# Patient Record
Sex: Male | Born: 1950
Health system: Southern US, Community
[De-identification: ages and names within clinical notes are randomized; demographics above are authoritative.]

## PROBLEM LIST (undated history)

## (undated) DIAGNOSIS — M1990 Unspecified osteoarthritis, unspecified site: Secondary | ICD-10-CM

## (undated) DIAGNOSIS — I639 Cerebral infarction, unspecified: Secondary | ICD-10-CM

## (undated) DIAGNOSIS — E7800 Pure hypercholesterolemia, unspecified: Secondary | ICD-10-CM

## (undated) DIAGNOSIS — G20A1 Parkinson's disease without dyskinesia, without mention of fluctuations: Secondary | ICD-10-CM

## (undated) DIAGNOSIS — F419 Anxiety disorder, unspecified: Secondary | ICD-10-CM

## (undated) DIAGNOSIS — F4000 Agoraphobia, unspecified: Secondary | ICD-10-CM

## (undated) DIAGNOSIS — I10 Essential (primary) hypertension: Secondary | ICD-10-CM

## (undated) DIAGNOSIS — K219 Gastro-esophageal reflux disease without esophagitis: Secondary | ICD-10-CM

## (undated) DIAGNOSIS — G20 Parkinson's disease: Secondary | ICD-10-CM

## (undated) DIAGNOSIS — I2510 Atherosclerotic heart disease of native coronary artery without angina pectoris: Secondary | ICD-10-CM

## (undated) DIAGNOSIS — F329 Major depressive disorder, single episode, unspecified: Secondary | ICD-10-CM

## (undated) DIAGNOSIS — F29 Unspecified psychosis not due to a substance or known physiological condition: Secondary | ICD-10-CM

## (undated) DIAGNOSIS — Z8719 Personal history of other diseases of the digestive system: Secondary | ICD-10-CM

## (undated) DIAGNOSIS — R519 Headache, unspecified: Secondary | ICD-10-CM

## (undated) DIAGNOSIS — E119 Type 2 diabetes mellitus without complications: Secondary | ICD-10-CM

## (undated) HISTORY — PX: CARDIAC CATHETERIZATION: SHX172

## (undated) HISTORY — PX: CHOLECYSTECTOMY: SHX55

---

## 2000-08-13 ENCOUNTER — Encounter: Payer: Self-pay | Admitting: Internal Medicine

## 2000-08-13 ENCOUNTER — Encounter: Payer: Self-pay | Admitting: Emergency Medicine

## 2000-08-13 ENCOUNTER — Inpatient Hospital Stay (HOSPITAL_COMMUNITY): Admission: EM | Admit: 2000-08-13 | Discharge: 2000-08-14 | Payer: Self-pay | Admitting: Emergency Medicine

## 2000-11-05 ENCOUNTER — Encounter: Payer: Self-pay | Admitting: Emergency Medicine

## 2000-11-05 ENCOUNTER — Inpatient Hospital Stay (HOSPITAL_COMMUNITY): Admission: EM | Admit: 2000-11-05 | Discharge: 2000-11-07 | Payer: Self-pay | Admitting: Emergency Medicine

## 2000-11-06 ENCOUNTER — Encounter: Payer: Self-pay | Admitting: Internal Medicine

## 2000-11-07 ENCOUNTER — Encounter: Payer: Self-pay | Admitting: *Deleted

## 2000-11-28 ENCOUNTER — Emergency Department (HOSPITAL_COMMUNITY): Admission: EM | Admit: 2000-11-28 | Discharge: 2000-11-28 | Payer: Self-pay | Admitting: Emergency Medicine

## 2001-01-14 ENCOUNTER — Emergency Department (HOSPITAL_COMMUNITY): Admission: EM | Admit: 2001-01-14 | Discharge: 2001-01-15 | Payer: Self-pay | Admitting: Emergency Medicine

## 2001-11-16 ENCOUNTER — Emergency Department (HOSPITAL_COMMUNITY): Admission: EM | Admit: 2001-11-16 | Discharge: 2001-11-16 | Payer: Self-pay | Admitting: *Deleted

## 2002-02-19 ENCOUNTER — Emergency Department (HOSPITAL_COMMUNITY): Admission: EM | Admit: 2002-02-19 | Discharge: 2002-02-20 | Payer: Self-pay | Admitting: *Deleted

## 2002-06-01 ENCOUNTER — Emergency Department (HOSPITAL_COMMUNITY): Admission: EM | Admit: 2002-06-01 | Discharge: 2002-06-02 | Payer: Self-pay | Admitting: Emergency Medicine

## 2002-06-02 ENCOUNTER — Encounter: Payer: Self-pay | Admitting: Emergency Medicine

## 2002-06-27 ENCOUNTER — Encounter: Admission: RE | Admit: 2002-06-27 | Discharge: 2002-06-27 | Payer: Self-pay | Admitting: Orthopedic Surgery

## 2002-07-10 ENCOUNTER — Encounter: Admission: RE | Admit: 2002-07-10 | Discharge: 2002-07-10 | Payer: Self-pay | Admitting: Orthopedic Surgery

## 2002-07-10 ENCOUNTER — Emergency Department (HOSPITAL_COMMUNITY): Admission: EM | Admit: 2002-07-10 | Discharge: 2002-07-10 | Payer: Self-pay | Admitting: Emergency Medicine

## 2002-07-10 ENCOUNTER — Encounter: Payer: Self-pay | Admitting: Orthopedic Surgery

## 2002-07-15 ENCOUNTER — Emergency Department (HOSPITAL_COMMUNITY): Admission: EM | Admit: 2002-07-15 | Discharge: 2002-07-15 | Payer: Self-pay | Admitting: Emergency Medicine

## 2002-07-27 ENCOUNTER — Encounter: Admission: RE | Admit: 2002-07-27 | Discharge: 2002-07-27 | Payer: Self-pay | Admitting: Orthopedic Surgery

## 2002-08-27 ENCOUNTER — Emergency Department (HOSPITAL_COMMUNITY): Admission: EM | Admit: 2002-08-27 | Discharge: 2002-08-27 | Payer: Self-pay | Admitting: Emergency Medicine

## 2002-10-16 ENCOUNTER — Encounter: Payer: Self-pay | Admitting: Emergency Medicine

## 2002-10-16 ENCOUNTER — Emergency Department (HOSPITAL_COMMUNITY): Admission: EM | Admit: 2002-10-16 | Discharge: 2002-10-16 | Payer: Self-pay | Admitting: Emergency Medicine

## 2002-10-23 ENCOUNTER — Emergency Department (HOSPITAL_COMMUNITY): Admission: EM | Admit: 2002-10-23 | Discharge: 2002-10-23 | Payer: Self-pay | Admitting: Emergency Medicine

## 2002-10-28 ENCOUNTER — Emergency Department (HOSPITAL_COMMUNITY): Admission: EM | Admit: 2002-10-28 | Discharge: 2002-10-28 | Payer: Self-pay | Admitting: Emergency Medicine

## 2002-12-13 ENCOUNTER — Emergency Department (HOSPITAL_COMMUNITY): Admission: EM | Admit: 2002-12-13 | Discharge: 2002-12-13 | Payer: Self-pay | Admitting: Emergency Medicine

## 2002-12-19 ENCOUNTER — Emergency Department (HOSPITAL_COMMUNITY): Admission: EM | Admit: 2002-12-19 | Discharge: 2002-12-19 | Payer: Self-pay | Admitting: Emergency Medicine

## 2002-12-19 ENCOUNTER — Encounter: Payer: Self-pay | Admitting: Emergency Medicine

## 2003-05-03 ENCOUNTER — Emergency Department (HOSPITAL_COMMUNITY): Admission: EM | Admit: 2003-05-03 | Discharge: 2003-05-03 | Payer: Self-pay | Admitting: Emergency Medicine

## 2003-05-03 ENCOUNTER — Encounter: Payer: Self-pay | Admitting: Emergency Medicine

## 2003-06-02 ENCOUNTER — Emergency Department (HOSPITAL_COMMUNITY): Admission: EM | Admit: 2003-06-02 | Discharge: 2003-06-02 | Payer: Self-pay | Admitting: Emergency Medicine

## 2003-07-05 ENCOUNTER — Emergency Department (HOSPITAL_COMMUNITY): Admission: EM | Admit: 2003-07-05 | Discharge: 2003-07-05 | Payer: Self-pay | Admitting: Emergency Medicine

## 2003-07-05 ENCOUNTER — Encounter: Payer: Self-pay | Admitting: Emergency Medicine

## 2003-07-14 ENCOUNTER — Encounter: Payer: Self-pay | Admitting: Emergency Medicine

## 2003-07-14 ENCOUNTER — Emergency Department (HOSPITAL_COMMUNITY): Admission: EM | Admit: 2003-07-14 | Discharge: 2003-07-14 | Payer: Self-pay | Admitting: Emergency Medicine

## 2003-10-01 ENCOUNTER — Emergency Department (HOSPITAL_COMMUNITY): Admission: EM | Admit: 2003-10-01 | Discharge: 2003-10-01 | Payer: Self-pay | Admitting: Emergency Medicine

## 2004-07-21 ENCOUNTER — Emergency Department (HOSPITAL_COMMUNITY): Admission: EM | Admit: 2004-07-21 | Discharge: 2004-07-22 | Payer: Self-pay | Admitting: Emergency Medicine

## 2004-09-15 ENCOUNTER — Emergency Department (HOSPITAL_COMMUNITY): Admission: EM | Admit: 2004-09-15 | Discharge: 2004-09-15 | Payer: Self-pay | Admitting: Emergency Medicine

## 2004-09-20 ENCOUNTER — Emergency Department (HOSPITAL_COMMUNITY): Admission: EM | Admit: 2004-09-20 | Discharge: 2004-09-20 | Payer: Self-pay | Admitting: Emergency Medicine

## 2004-11-18 ENCOUNTER — Emergency Department (HOSPITAL_COMMUNITY): Admission: EM | Admit: 2004-11-18 | Discharge: 2004-11-18 | Payer: Self-pay | Admitting: Emergency Medicine

## 2004-12-21 ENCOUNTER — Emergency Department (HOSPITAL_COMMUNITY): Admission: EM | Admit: 2004-12-21 | Discharge: 2004-12-21 | Payer: Self-pay | Admitting: Emergency Medicine

## 2005-02-22 ENCOUNTER — Inpatient Hospital Stay (HOSPITAL_COMMUNITY): Admission: EM | Admit: 2005-02-22 | Discharge: 2005-02-25 | Payer: Self-pay | Admitting: Family Medicine

## 2005-02-22 ENCOUNTER — Ambulatory Visit: Payer: Self-pay | Admitting: Internal Medicine

## 2005-02-25 ENCOUNTER — Ambulatory Visit: Payer: Self-pay | Admitting: Psychiatry

## 2005-02-25 ENCOUNTER — Inpatient Hospital Stay (HOSPITAL_COMMUNITY): Admission: RE | Admit: 2005-02-25 | Discharge: 2005-03-04 | Payer: Self-pay | Admitting: Psychiatry

## 2005-03-23 ENCOUNTER — Emergency Department (HOSPITAL_COMMUNITY): Admission: EM | Admit: 2005-03-23 | Discharge: 2005-03-23 | Payer: Self-pay | Admitting: Emergency Medicine

## 2005-04-05 ENCOUNTER — Emergency Department (HOSPITAL_COMMUNITY): Admission: EM | Admit: 2005-04-05 | Discharge: 2005-04-05 | Payer: Self-pay | Admitting: Emergency Medicine

## 2005-04-19 ENCOUNTER — Emergency Department (HOSPITAL_COMMUNITY): Admission: EM | Admit: 2005-04-19 | Discharge: 2005-04-19 | Payer: Self-pay | Admitting: Emergency Medicine

## 2005-10-21 ENCOUNTER — Emergency Department (HOSPITAL_COMMUNITY): Admission: EM | Admit: 2005-10-21 | Discharge: 2005-10-21 | Payer: Self-pay | Admitting: Emergency Medicine

## 2006-05-20 ENCOUNTER — Emergency Department (HOSPITAL_COMMUNITY): Admission: EM | Admit: 2006-05-20 | Discharge: 2006-05-20 | Payer: Self-pay | Admitting: *Deleted

## 2008-02-13 ENCOUNTER — Emergency Department (HOSPITAL_COMMUNITY): Admission: EM | Admit: 2008-02-13 | Discharge: 2008-02-13 | Payer: Self-pay | Admitting: Emergency Medicine

## 2008-03-29 ENCOUNTER — Emergency Department (HOSPITAL_COMMUNITY): Admission: EM | Admit: 2008-03-29 | Discharge: 2008-03-29 | Payer: Self-pay | Admitting: Emergency Medicine

## 2008-06-04 ENCOUNTER — Emergency Department (HOSPITAL_COMMUNITY): Admission: EM | Admit: 2008-06-04 | Discharge: 2008-06-04 | Payer: Self-pay | Admitting: Emergency Medicine

## 2008-06-27 ENCOUNTER — Emergency Department (HOSPITAL_COMMUNITY): Admission: EM | Admit: 2008-06-27 | Discharge: 2008-06-27 | Payer: Self-pay | Admitting: Emergency Medicine

## 2008-12-06 ENCOUNTER — Emergency Department (HOSPITAL_COMMUNITY): Admission: EM | Admit: 2008-12-06 | Discharge: 2008-12-06 | Payer: Self-pay | Admitting: Emergency Medicine

## 2009-02-13 ENCOUNTER — Emergency Department (HOSPITAL_COMMUNITY): Admission: EM | Admit: 2009-02-13 | Discharge: 2009-02-13 | Payer: Self-pay | Admitting: Emergency Medicine

## 2009-04-18 ENCOUNTER — Emergency Department (HOSPITAL_COMMUNITY): Admission: EM | Admit: 2009-04-18 | Discharge: 2009-04-18 | Payer: Self-pay | Admitting: Emergency Medicine

## 2010-02-11 ENCOUNTER — Emergency Department (HOSPITAL_COMMUNITY): Admission: EM | Admit: 2010-02-11 | Discharge: 2010-02-11 | Payer: Self-pay | Admitting: Gastroenterology

## 2010-02-11 ENCOUNTER — Emergency Department (HOSPITAL_COMMUNITY): Admission: EM | Admit: 2010-02-11 | Discharge: 2010-02-11 | Payer: Self-pay | Admitting: Emergency Medicine

## 2010-02-16 ENCOUNTER — Emergency Department (HOSPITAL_COMMUNITY): Admission: EM | Admit: 2010-02-16 | Discharge: 2010-02-17 | Payer: Self-pay | Admitting: Emergency Medicine

## 2010-09-20 DIAGNOSIS — K759 Inflammatory liver disease, unspecified: Secondary | ICD-10-CM

## 2010-09-20 HISTORY — DX: Inflammatory liver disease, unspecified: K75.9

## 2010-12-07 LAB — COMPREHENSIVE METABOLIC PANEL
ALT: 38 U/L (ref 0–53)
AST: 32 U/L (ref 0–37)
Albumin: 4.6 g/dL (ref 3.5–5.2)
Alkaline Phosphatase: 64 U/L (ref 39–117)
BUN: 10 mg/dL (ref 6–23)
CO2: 22 mEq/L (ref 19–32)
Calcium: 9.2 mg/dL (ref 8.4–10.5)
Chloride: 103 mEq/L (ref 96–112)
Creatinine, Ser: 1.03 mg/dL (ref 0.4–1.5)
GFR calc Af Amer: >60 mL/min (ref >60)
GFR calc non Af Amer: >60 mL/min (ref >60)
Glucose, Bld: 162 mg/dL — ABNORMAL HIGH (ref 70–99)
Potassium: 3.3 mEq/L — ABNORMAL LOW (ref 3.5–5.1)
Sodium: 134 mEq/L — ABNORMAL LOW (ref 135–145)
Total Bilirubin: 0.7 mg/dL (ref 0.3–1.2)
Total Protein: 7.8 g/dL (ref 6.0–8.3)

## 2010-12-07 LAB — CBC
HCT: 47.9 % (ref 39.0–52.0)
Hemoglobin: 16.2 g/dL (ref 13.0–17.0)
MCHC: 33.9 g/dL (ref 30.0–36.0)
MCV: 78.3 fL (ref 78.0–100.0)
Platelets: 200 10*3/uL (ref 150–400)
RBC: 6.11 MIL/uL — ABNORMAL HIGH (ref 4.22–5.81)
RDW: 13.8 % (ref 11.5–15.5)
WBC: 5.5 10*3/uL (ref 4.0–10.5)

## 2010-12-07 LAB — DIFFERENTIAL
Basophils Absolute: 0 10*3/uL (ref 0.0–0.1)
Basophils Relative: 0 % (ref 0–1)
Eosinophils Absolute: 0 10*3/uL (ref 0.0–0.7)
Eosinophils Relative: 1 % (ref 0–5)
Lymphocytes Relative: 31 % (ref 12–46)
Lymphs Abs: 1.7 10*3/uL (ref 0.7–4.0)
Monocytes Absolute: 0.6 10*3/uL (ref 0.1–1.0)
Monocytes Relative: 11 % (ref 3–12)
Neutro Abs: 3.1 10*3/uL (ref 1.7–7.7)
Neutrophils Relative %: 57 % (ref 43–77)

## 2010-12-07 LAB — POCT URINALYSIS DIP (DEVICE)
Glucose, UA: NEGATIVE mg/dL
Protein, ur: >=300 mg/dL — AB

## 2010-12-07 LAB — RAPID URINE DRUG SCREEN, HOSP PERFORMED
Amphetamines: NOT DETECTED
Barbiturates: NOT DETECTED
Benzodiazepines: NOT DETECTED
Cocaine: NOT DETECTED
Opiates: NOT DETECTED

## 2010-12-07 LAB — LIPASE, BLOOD: Lipase: 15 U/L (ref 11–59)

## 2010-12-07 LAB — URINE CULTURE

## 2010-12-27 LAB — COMPREHENSIVE METABOLIC PANEL
AST: 22 U/L (ref 0–37)
Alkaline Phosphatase: 64 U/L (ref 39–117)
Creatinine, Ser: 0.93 mg/dL (ref 0.4–1.5)
GFR calc non Af Amer: >60 mL/min (ref >60)
Potassium: 3.7 mEq/L (ref 3.5–5.1)
Total Bilirubin: 0.2 mg/dL — ABNORMAL LOW (ref 0.3–1.2)

## 2010-12-27 LAB — DIFFERENTIAL
Basophils Absolute: 0 10*3/uL (ref 0.0–0.1)
Eosinophils Absolute: 0 10*3/uL (ref 0.0–0.7)
Monocytes Relative: 5 % (ref 3–12)

## 2010-12-27 LAB — CBC
HCT: 41.8 % (ref 39.0–52.0)
MCHC: 33.2 g/dL (ref 30.0–36.0)
MCV: 79.2 fL (ref 78.0–100.0)
Platelets: 206 10*3/uL (ref 150–400)
RBC: 5.27 MIL/uL (ref 4.22–5.81)
WBC: 9.8 10*3/uL (ref 4.0–10.5)

## 2010-12-31 LAB — URINALYSIS, ROUTINE W REFLEX MICROSCOPIC
Bilirubin Urine: NEGATIVE
Ketones, ur: NEGATIVE mg/dL
Leukocytes, UA: NEGATIVE
Nitrite: NEGATIVE
pH: 6 (ref 5.0–8.0)

## 2010-12-31 LAB — COMPREHENSIVE METABOLIC PANEL
AST: 15 U/L (ref 0–37)
Albumin: 4.1 g/dL (ref 3.5–5.2)
Alkaline Phosphatase: 48 U/L (ref 39–117)
Chloride: 99 mEq/L (ref 96–112)
GFR calc Af Amer: >60 mL/min (ref >60)
GFR calc non Af Amer: >60 mL/min (ref >60)
Total Bilirubin: 0.5 mg/dL (ref 0.3–1.2)

## 2010-12-31 LAB — GLUCOSE, CAPILLARY: Glucose-Capillary: 133 mg/dL — ABNORMAL HIGH (ref 70–99)

## 2010-12-31 LAB — CBC
Hemoglobin: 14.5 g/dL (ref 13.0–17.0)
MCHC: 33.1 g/dL (ref 30.0–36.0)
RBC: 5.44 MIL/uL (ref 4.22–5.81)
RDW: 14.5 % (ref 11.5–15.5)
WBC: 6.6 10*3/uL (ref 4.0–10.5)

## 2010-12-31 LAB — DIFFERENTIAL
Basophils Absolute: 0 10*3/uL (ref 0.0–0.1)
Eosinophils Absolute: 0.1 10*3/uL (ref 0.0–0.7)

## 2010-12-31 LAB — URINE MICROSCOPIC-ADD ON

## 2011-02-02 NOTE — H&P (Signed)
NAME:  MART, COLPITTS              ACCOUNT NO.:  [id number]   MEDICAL RECORD NO.:  [id number]          PATIENT TYPE:  EMS   LOCATION:  ED                           FACILITY:  [facility]   PHYSICIAN:  Mobolaji B. Bakare, M.D.DATE OF BIRTH:  June 09, 1951   DATE OF ADMISSION:  03/29/2008  DATE OF DISCHARGE:                              HISTORY & PHYSICAL   PRIMARY CARE PHYSICIAN:  Unassigned.   CHIEF COMPLAINT:  Epigastric pain.   HISTORY OF PRESENTING COMPLAINT:  Mr. Dutton is a 60 year old African  American male with medical history of diabetes mellitus and  hypertension.  He was in his usual state of health until last evening  about 10:00 p.m., when he developed epigastric pain.  He vomited once.  The vomitus was nonbilious.  The pain subsided upon arrival in the  emergency room.  He has no fever.  He tells me that he has had diarrhea  for about a week, but this has also resolved.  When he had the diarrhea,  there was no abdominal pain or vomiting.  No hematemesis and no  hematochezia.  I have been asked to admit Mr. Anselmi for chest pain;  however, he denies any form of chest pain whatsoever.  He has not been  drinking alcohol, although he drinks occasionally.  Last drink was on  March 23, 2008.  Facial rash along bearded area.   REVIEW OF SYSTEMS:  He denies chest pain, shortness of breath, orthopnea  or paroxysmal nocturnal dyspnea.  No lower extremity edema.  No dysuria,  urgency or increased frequency of micturition.   PAST MEDICAL HISTORY:  1. Hypertension.  2. Diabetes mellitus since 1995.  3. Tremors/Parkinson's disease.  4. Sinus bradycardia.   PAST SURGICAL HISTORY:  None.   CURRENT MEDICATIONS:  1. Sea-Omega 30, 1200 mg four times a day.  2. Omeprazole 20 mg two times a day.  3. Metformin 850 mg b.i.d.  4. Aspirin 81 mg daily.  5. Diclofenac 50 mg three times a day.  6. Simvastatin 40 mg daily.  7. Hydroxyzine 25 mg 2 capsules three times a day and 4 capsules at      bedtime.  8. Lisinopril 5 mg half-tablet daily.  9. Mirtazapine 45 mg nightly.  10.Trazodone 150 mg nightly.  11.Citalopram 40 mg in a.m.  12.__________ cream p.r.n.  13.Triamcinolone acetonide.  14.Clotrimazole two times daily.  15.Selenium sulfide 2.5%.  16.Benzoyl peroxide gel.  17.Clindamycin sulfate.  18.Hydrocortisone cream.  19.Gabapentin 400 mg t.i.d.   A ALLERGIES:  NO KNOWN DRUG ALLERGIES.   FAMILY HISTORY:  Both parents passed away from some form of  gastrointestinal cancer.  He tells me that this was stomach cancer.  Mother passed away at the age of 75 from stomach cancer and dad passed  away about 3 years ago.   SOCIAL HISTORY:  He occasionally drinks alcohol.  He smokes cigarettes,  half-pack per day.   PHYSICAL EXAMINATION:  VITAL SIGNS:  Initial vital signs; temperature  98, blood pressure 163/90.  Repeat blood pressure was 117/79.  Pulse 55-  79, respiratory rate 18-24.  O2 sats 98%  on room air.  GENERAL:  The patient is awake, alert and oriented to time, place and  person.  HEENT:  Normocephalic, atraumatic.  Pupils equal, round and reactive to  light.  Extraocular muscle movements intact.  Mucous membranes moist.  No oral thrush.  NECK:  No elevated JVD.  No carotid bruit.  LUNGS:  Clear clinically to auscultation.  CVS:  S1-S2 regular.  No murmur or gallop.  ABDOMEN:  Not distended, soft.  Very mild epigastric tenderness.  No  rebound or guarding.  Bowel sounds present.  No palpable organomegaly.  EXTREMITIES:  No pedal edema or calf tenderness.  Dorsalis pedis pulses  palpable bilaterally.  CNS:  No focal neurological deficit.   INITIAL LABORATORY DATA:  Urine drug screen positive for opiates.  Sodium 138, potassium 3.4, chloride 102, bicarb 26, glucose 179, BUN 6,  creatinine 1.03.  Total bilirubin 0.4, alkaline phosphatase 59, AST 21,  ALT 20, total protein 6.6, albumin 3.8, calcium 9.8, lipase 28.  Fecal  occult blood negative.  Cardiac markers  at the point of care normal.  White cells 9.1, hemoglobin 15.4, hematocrit 45.7, MCV 78.7, platelets  207, normal differential.  CT angiogram of the chest shows no acute  abnormalities, no evidence of pulmonary emboli.  There is a 1.5 x 2 cm  gallstone present.  EKG shows sinus bradycardia with a heart rate of 52,  no ST changes.   ASSESSMENT AND PLAN:  1. Mr. Homan is a 60 year old African American male presenting with      epigastric pain, nausea and vomiting.  Symptoms have not subsided.      His lipase is normal.  He has no leukocytosis.  He is afebrile.      Liver enzymes are normal, although gallstones are noted on chest CT      scan.  He denies any chest pain.  EKG is normal.  The patient will      be admitted for 24-hour observation.  Epigastric pain and vomiting      may be related to gastritis, query angina equivalent (doubt).  Will      give him IV fluid normal saline at 100 mL per hour with potassium      supplement, morphine 1-2 mg IV q.4 h., p.r.n.  Will check an      abdominal x-ray and another set of cardiac panel.  If these are      normal and the patient is stable, he could possibly be discharged      home today.  2. Diabetes mellitus.  He was on metformin prior to hospitalization.      The patient received IV contrast.  Metformin needs to be restarted      48 hours later.  We will give him Lantus 10 units subcutaneous      daily and cover with sliding scale insulin.  3. Hypertension.  Will resume lisinopril.  4. Hypokalemia.  Will replete with potassium chloride.  5. Depression.  Continue citalopram.  6. Tobacco abuse.  Nicotine patch 14 mg daily and offer tobacco      cessation counseling.      Mobolaji B. Corky Downs, M.D.  Electronically Signed     MBB/MEDQ  D:  03/29/2008  T:  03/29/2008  Job:  [id number]

## 2011-02-05 NOTE — Discharge Summary (Signed)
[facility]  Patient:    Troy Little, Troy Little                   MRN: [id number] Adm. Date:  [id number] Disc. Date: [id number] Attending:  Madaline Guthrie Dictator:   Thayer Headings, MS-IV                           Discharge Summary  RESIDENT PHYSICIAN:  Tawni Millers, M.D.  REFERRING PHYSICIAN:  None.  PRINCIPAL DIAGNOSES: 1. Atypical chest pain/esophageal spasm. 2. Hepatitis C. 3. Diabetes. 4. Sinus bradycardia. 5. Peptic ulcer disease. 6. Osteoarthritis. 7. Depression. 8. History of alcoholism.  PRINCIPAL PROCEDURES:  Cardiolite study on November 07, 2000: No evidence of ischemia or wall motion abnormality, ejection fraction 64%.  PERTINENT LABORATORIES AT TIME OF DISCHARGE:  TSH 0.52.  White blood cell count 6.0, hemoglobin 14.3, hematocrit 43.5, platelet count 178.  Sodium 139, potassium 3.6, chloride 104, bicarb of 28, glucose 112, BUN 10, creatinine 0.8, calcium 9.3.  Blood alcohol level less than 10.  UA negative.  SUMMARY OF HOSPITAL COURSE:  Patient was admitted on November 05, 2000 with episode of chest pain at rest and with exertion.  It was sharp, stabbing, and substernal.  He had had a history of a negative treadmill test and a negative loop monitor in December 2001 at [facility] by his own report.  He has had frequent admissions for similar symptoms but has ruled out for MI each time. This admission patient was admitted to the telemetry floor and observed. He was ruled out by serial isoenzymes, and given history of repeated presentations for similar signs of chest pain, it was determined he may benefit from a Cardiolite study which was performed on November 07, 2000. Cardiolite study showed normal ejection fraction with no wall motion abnormality and no evidence of ischemia, cardiology felt that his pain was likely not cardiac in origin.  Patient showed some signs of improvement by being started empirically on p.o. Protonix.   Patient also had no acute episodes on telemetry during the course of his hospitalization.  Patient was noted to have persistent bradycardia which is normal for this patient.  Since patient lives in a group home and has nursing care on site, and no cardiac etiology was found for his chest pain, he could safely return to his facility to be followed up as an outpatient.  Therefore, he was discharged on November 07, 2000 to return to the care of the ___________ in [place], [place].  INSTRUCTIONS TO PATIENT AND DISCHARGE PLANS:  Patient was advised to return the ER or his primary physician if his chest pain worsened or if he became acutely short of breath.  A followup appointment was made with Dr. Verdis Frederickson and Thayer Headings, MS-IV on November 16, 2000 at 3 p.m. in the hospital followup clinic in the [facility] for which the patient was provided information and an appointment card.  Patient was advised to continue taking all medications as directed.  Patient had concerns regarding his gallbladder, and numerous times requested to have his gallbladder removed, however he showed no signs of gallbladder disease, and was instructed that there was no need for surgery at this time.  Patient expressed interest in going to the [place] to have his gallbladder removed, and was reassured this was not necessary, however at time of discharge patient was still insistent that that was the course he wanted to follow.  DISCHARGE  MEDICATIONS: 1. Risperdal 10 mg p.o. q.h.s. 2. Effexor 75 mg p.o. b.i.d. 3. Atarax 50 mg p.o. q.h.s. 4. Aspirin 81 mg p.o. q.d. 5. Protonix 40 mg p.o. q.a.m. 6. Percocet 5/325 mg p.o. q.6h. p.r.n. 7. Nitroglycerin 0.4 mg sublingual every five minutes not to exceed three    tablets in 15 minutes as needed for chest pain. DD:  11/11/00 TD:  11/14/00 Job: [id number]

## 2011-02-05 NOTE — H&P (Signed)
NAME:  Troy Little, ANDER NO.:  [id number]   MEDICAL RECORD NO.:  [id number]          PATIENT TYPE:  IPS   LOCATION:  0504                          FACILITY:  [facility]   PHYSICIAN:  Geoffery Lyons, M.D.      DATE OF BIRTH:  [birth year]   DATE OF ADMISSION:  02/25/2005  DATE OF DISCHARGE:                         PSYCHIATRIC ADMISSION ASSESSMENT   IDENTIFYING INFORMATION:  A 60 year old divorced African-American male  voluntarily admitted on February 25, 2005.   HISTORY OF PRESENT ILLNESS:  The patient presents with a history of  substance abuse, is a transfer from [facility] after medically cleared for  problems with chest pain.  The patient had gone to the emergency room with  complaints of chest pain.  The patient states that while he was there he was  complaining of depressive symptoms and states he is currently here for dual  diagnosis.  the patient reports use of crack cocaine.  He reports he has  been having decreased sleep, sleeping about 4-5 hours at a time.  Appetite  has been satisfactory.  He reports mood swings with visual hallucinations,  seeing strange dark figures.   PAST PSYCHIATRIC HISTORY:  First admission to [facility], he  has had other psychiatric admissions in [place], [place], and [place].  He  has a history of suicide attempts, states he has had several by  overdosing.  States he was in a coma at the time and states he stepped in  front of a big rig but they missed him.  He is an outpatient at the [place] in  [place], has not been there for 1 year.   SOCIAL HISTORY:  He is a24 year old divorced African-American male, has a  grown daughter, lives alone.  He is on disability from medical and  psychiatric problems.   FAMILY HISTORY:  Unclear.   ALCOHOL DRUG HISTORY:  The patient smokes, denies any alcohol use, reports  recent cocaine use.   PAST MEDICAL HISTORY:  Primary care Sidni Fusco is the [place].  Medical problems  are benign prostatic  hypertrophy, hepatitis C, degenerative disk disease,  and dyslipidemia.   MEDICATIONS:  The patient was discharged from the hospital on:  1.  Aspirin 81 mg daily.  2.  Metformin 500 mg daily.  3.  Protonix 40 mg daily.  In the past, the patient has been on Effexor, Prozac which he states has  gotten him very agitated.   DRUG ALLERGIES:  No known allergies.   PHYSICAL EXAMINATION:  Again, the patient was assessed at [facility].  The  patient today is a middle-aged African-American male in no acute distress.  Appears well-nourished.   MENTAL STATUS EXAM:  He is a fully alert, cooperative, fair eye contact.  Speech is clear, normal pace and tone.  He is rambling some.  The patient  feels depressed.  Affect is flat.  Thought processes are coherent, with no  evidence of psychosis.  Cognitive function intact.  Memory is good, judgment  and insight is fair.   ADMISSION DIAGNOSES:  AXIS I:  Depressive disorder not otherwise specified.  Cocaine abuse.  Rule out bipolar disorder not otherwise specified.  AXIS II:  Deferred.  AXIS III:  Non-insulin-dependent diabetes mellitus, hepatitis C, benign  prostatic hypertrophy, degenerative disk disease, and dyslipidemia.  AXIS IV:  Problems with other psychosocial problems, medical problems.  AXIS V:  Current is 30, past year 81.   PLAN:  Plan is to admit the patient voluntarily.  Contract for safety,  stabilize mood and thinking.  Work on relapse prevention.  We will have  Symmetrel available to cocaine cravings.  We will check CBGs b.i.d.  We will  consider initiating an antidepressant.  We will monitor the patient today  while the patient is detoxing off substance.  We will have Ambien available  for sleep.  The patient is to attend groups.   TENTATIVE LENGTH OF STAY:  5-7 days.       JO/MEDQ  D:  02/26/2005  T:  02/26/2005  Job:  [id number]

## 2011-02-05 NOTE — Discharge Summary (Signed)
NAME:  Troy Little, Troy Little NO.:  [id number]   MEDICAL RECORD NO.:  [id number]          PATIENT TYPE:  IPS   LOCATION:  0504                          [facility]ACILITY:  [facility]   PHYSICIAN:  Jeanice Lim, M.D. DATE OF BIRTH:  April 08, 1951   DATE OF ADMISSION:  02/25/2005  DATE OF DISCHARGE:  03/04/2005                                 DISCHARGE SUMMARY   IDENTIFYING DATA:  This is a 60 year old divorced African-American male  voluntarily admitted June 8 with history of substance abuse, transferred  from [facility] after being medically cleared for chest pain, had gone to  the emergency room with complaints of chest pain and was complaining also of  depression, also reported a history of crack cocaine use, having decreased  sleep, 4 hours a night, and sudden mood swings, seeing strange, dark  figures.  Past psychiatric history:  First admission to [facility], admissions in [place], [place], [place], and had a history of  several overdoses in the past including resulting in a coma, and states he  had stepped out in front of a big rig but they missed him.  Also had been  outpatient in the [place] in [place] but not there for one year.  Denied  alcohol or nicotine or tobacco use, reported recent cocaine use.   ADMISSION MEDICATIONS:  Aspirin, metformin, Protonix.  In the past had been  on Effexor, Prozac which caused him to become more agitated.   PHYSICAL AND NEUROLOGICAL EXAMINATION:  Within normal limits.   ROUTINE ADMISSION LABS:  Within normal limits.   MENTAL STATUS EXAM:  Alert, cooperative, fair eye contact, speech clear,  rambling some.  The patient felt depressed.  Affect flat.  Thought process  goal directed, no evidence of psychosis, cognitively intact.  Judgment and  insight were fair.   ADMISSION DIAGNOSES:  AXIS I:  Depressive disorder not otherwise specified,  cocaine abuse, rule out bipolar disorder not otherwise specified.  AXIS II:  Deferred.  AXIS III:  Non-insulin-dependent diabetes mellitus, hepatitis C, benign  prostatic hypertrophy, degenerative disk disease and dyslipidemia.  AXIS IV:  Moderate psychosocial stressors and medical problems.  AXIS V:  30/60.   HOSPITAL COURSE:  The patient was admitted and ordered routine p.r.n.  medications, underwent further monitoring, and was encouraged to participate  in individual, group and milieu therapy, was given Symmetrel for cocaine  cravings.  CBGs were checked and the patient was initiated on antidepressant  while being detoxed and sleep was restored.  The patient reported positive  response to crisis intervention and detox and stabilization on medication  and was discharged in improved condition.  Mood was more euthymic, affect  brighter, no acute withdrawal symptoms, reported motivation to remain  abstinent and be compliant with psychiatric followup.  Had a relapse  prevention plan, was given medication education and was discharged on:  1.  Allegra.  2.  Triamcinolone cream.  3.  Librium q.8h p.r.n. withdrawal symptoms.  4.  Trazodone 100 mg at 8 p.m.  5.  Loxitane 10 mg at 12 p.m., 8 p.m.  6.  Zoloft 25  mg q.a.m.  7.  Depakote 500 mg q.8 p.m.   DISPOSITION:  The patient was discharged again in improved condition, mood  stable, no dangerous ideation.  He was given medication education regarding  medications.   DISCHARGE DIAGNOSES:  AXIS I:  Depressive disorder not otherwise specified,  cocaine abuse, rule out bipolar disorder not otherwise specified.  AXIS II:  Deferred.  AXIS III:  Non-insulin-dependent diabetes mellitus, hepatitis C, benign  prostatic hypertrophy, degenerative disk disease and dyslipidemia.  AXIS IV:  Moderate psychosocial stressors and medical problems.  AXIS V:  Global assessment of function on discharge was 55.       JEM/MEDQ  D:  04/08/2005  T:  04/08/2005  Job:  [id number]

## 2011-02-05 NOTE — Discharge Summary (Signed)
[place]. [facility]  Patient:    Troy Little, Troy Little                   MRN: [id number] Adm. Date:  [id number] Disc. Date: [id number] Attending:  Farley Ly Dictator:   Bernerd Pho, M.D. CC:         Audley Hose, P.A., [facility] (phone: [phone])   Discharge Summary  DISCHARGE DIAGNOSES: 1. Nonanginal atypical chest pain. 2. Sinus bradycardia. 3. Transaminasemia.  DISCHARGE MEDICATIONS:  Prilosec 20 mg p.o. q.d. and the other medications that the patient was on prior to admission.  CONSULTATIONS:  None.  PROCEDURES:  The patient had an abdominal ultrasound performed on August 13, 2000, which was negative.  CHIEF COMPLAINT:  Epigastric pain characterized as burning and stabbing.  HISTORY OF PRESENT ILLNESS:  The patient is a 60 year old with no known history of coronary artery disease who presents with epigastric pain that awoke him from a nap at roughly noon on the day of admission.  The patient states that it was 12/10 pain in the epigastric region that felt like a burning stabbing pain.  The patient states he was nauseated and slightly short of breath secondary to the pain.  He also acknowledged diaphoresis, but no radiation.  The patient states his pain lasted with it unchanged throughout his evaluation in the emergency department which was five hours after the onset.  The patient received aspirin and nitroglycerin x 3 in the emergency department without relief.  Morphine helped somewhat, decreasing his pain to 6/10.  The patient states he has had this discomfort in the past, though it has not been this bad.  The patients last episode was roughly one week prior to admission.  The patient states his pain is not associated with exertion, but states that his pain usually occurs after eating.  The patient has been feeling well otherwise and is at his baseline.  He denies any fevers, chills, shortness of breath, change in bowel  movements, dysuria, or cough.  Review of Systems is only positive for occasional back pain and knee pain which is chronic.  The patients cardiac risk factors include Family History and smoking, but does not have any history of hypertension or prior CAD.  The patient states he is a borderline diabetic and has borderline cholesterol.  PAST MEDICAL HISTORY: 1. BPH. 2. Hepatitis C positive diagnosed in 1997 and 1998.  States his liver biopsy    was planned in January 2001. 3. Depression/anxiety/agoraphobia which is under control without any problems.    The [facility] [place] for his psych    issues. 4. C-spine with bulging disks managed by pain medications for the past 10    years.  The patient plans to see a surgeon soon in regards to possible    surgery. 5. Arthritis, status post right knee arthroscopic surgery. 6. Borderline diabetes. 7. Borderline cholesterol.  MEDICATIONS ON ADMISSION:  The patient is unaware of the doses of his medications, but states he is on: 1. Hydrocodone one tablet q.6h. p.r.n. 2. Cogentin one tablet in the morning. 3. Risperidone one tablet in the evening. 4. He also states he takes another pill at night to help him sleep.  ALLERGIES:  No known drug allergies.  SOCIAL HISTORY:  The patient lives in [place] at the [facility] which is a homeless shelter for veterans with medical problems.  The patient states he is single with one daughter  and works at [employer].  The patient denies any alcohol or other drug use.  He states he stopped drinking alcohol in December 2000.  The patient states he smokes about a half-pack per day and has about a 30-pack-year history.  FAMILY HISTORY:  The patients father is alive, but had a heart attack in his late 68s.  The patient states his grandfather died at age 82 full range of motion a heart attack.  Otherwise, there is no heart disease in the  family.  REVIEW OF SYSTEMS:  As stated in the HPI.  PHYSICAL EXAMINATION:  GENERAL APPEARANCE:  The patient is a middle-aged African-American male in no apparent distress.  VITAL SIGNS:  Blood pressure 128/76, heart rate 57, respiration rate 18, temperature 97.5, oxygen saturation on room air 99%.  HEENT:  Sclerae, conjunctivae, corneas clear.  Pupils equal, round, and reactive to light.  Normal oral and nasal mucosa.  NECK:  No lymphadenopathy or bruits.  CARDIOVASCULAR:  Regular rate and rhythm except for slight bradycardia.  ABDOMEN:  Nondistended, soft, positive bowel sounds, slight epigastric tenderness to palpation.  RECTAL:  Slightly enlarged prostate.  Otherwise, nontender and guaiac-negative.  EXTREMITIES:  No clubbing, cyanosis, or edema, and pulses symmetric.  NEUROLOGIC:  Nonfocal.  SKIN:  No rashes.  ADMISSION LABORATORY AND X-RAY DATA:  CK 196, MB 2.1, troponin I 0.02.  The patients hemoglobin was 16.9, his white count 6.1, and his platelets 207 with an ANC of 3.  The patients admission electrolytes were as follows:  Sodium 137, potassium 3.6, chloride 104, bicarb 26, BUN 12 creatinine 0.9, and glucose of 100.  The patients LFTs were as follows:  AST 81, ALT 40, alk phos 96, and total bili 1.0.  The patients albumin was 4.2.  The patients amylase was 94, and his urine drug screen was positive for opiates.  The patients urinalysis was negative.  The patients admission chest x-ray showed no active disease.  CT of the chest ordered by the emergency room physician showed no PE, and lower extremity CT showed no evidence of DVT.  The patients EKG remarkable for sinus bradycardia with a rate of 51 and an incomplete right bundle branch block, but no ST changes or T wave inversions.  HOSPITAL COURSE: #1 - ATYPICAL NONANGINAL CHEST PAIN:  The patient was admitted and placed on telemetry for possible myocardial infarction.  The patient was ruled out for myocardial  infarction by three negative sets of cardiac enzymes and serial  EKGs which did not show any changes.  The patients telemetry was remarkable for bradycardia as his rate was in the 40s to 50s throughout the night.  It was thought that the etiology of the patients atypical nonanginal chest pain was GI.  The patient was discharged on Prilosec 20 mg p.o. q.d. for possible gastritis.  #2 - SINUS BRADYCARDIA:  Although the patient was asymptomatic during this hospitalization, the patients sinus bradycardia should be worked up as an outpatient if the patient develops presyncope or other symptoms.  It is suggested that he be given a Holter monitor or an event monitor to record his heart rate during moments of presyncope.  #3 - BORDERLINE DIABETES:  The patients hemoglobin A1C was tested during this admission, and it was fairly normal at 6.  The patients fasting glucose on Chem-7 was 119.  #4 - PSYCHIATRIC HISTORY:  The patient was stable during this admission, and was placed on the lowest dose of Cogentin and risperidone since  his dose was unknown.  DISPOSITION:  The patient is being discharged to home.  RESIDENT PHYSICIANS:  Tawni Millers, M.D. and Bernerd Pho, M.D. DD:  08/15/00 TD:  08/15/00 Job: [id number]

## 2011-02-05 NOTE — Discharge Summary (Signed)
NAMEMarland Kitchen  Troy Little, Troy Little NO.:  [id number]   MEDICAL RECORD NO.:  [id number]          PATIENT TYPE:  INP   LOCATION:  4731                         FACILITY:  [facility]   PHYSICIAN:  Ileana Roup, M.D.  DATE OF BIRTH:  October 18, 1950   DATE OF ADMISSION:  02/22/2005  DATE OF DISCHARGE:  02/25/2005                                 DISCHARGE SUMMARY   DISCHARGE DIAGNOSES:  1.  Chest pain secondary to crack cocaine use.  2.  Diabetes mellitus type 2.  3.  Dyslipidemia.  4.  Polysubstance abuse.  5.  Schizophrenia, paranoid-type, chronic with acute exacerbation.  6.  Depressive disorder.   DISCHARGE MEDICATIONS:  1.  Aspirin 81 mg p.o. every day.  2.  Metformin 500 mg p.o. every day.  3.  Tylenol 500 mg 1-2 tablets four times a day for pain p.r.n.  4.  Protonix 40 mg p.o. every day.   DISPOSITION:  The patient is to go to psychiatry ward for further evaluation  and treatment.   CONSULTS:  Dr. Sharyn Lull from cardiology.  Dr. Jeanie Sewer from psychiatry.   PROCEDURE:  Chest x-ray on February 22, 2005.  Impression:  No evidence of active  cardiopulmonary disease. Myoview finding:  There is no evidence of  reversibility to suggest ischemia.  This is confirmed by the computer-  generated polar map.  Ejection fraction was calculated to be 62% and normal  wall motion.   HISTORY OF PRESENT ILLNESS:  Troy Little is a 60 year old African-American  male with positive family history for coronary artery disease, tobacco  abuse, depression, diabetes mellitus and degenerative disk disease of the  cervical spine who came to the emergency room this a.m. secondary to new  onset of severe 10/10 pressure-like chest pain after he inhaled crack  cocaine.  His pain was associated with diaphoresis and dyspnea and lasted  for about one hour.  He denied chest pain at effort, dyspnea at effort,  orthopnea, PND or edema.   ALLERGIES:  No known drug allergies.   PAST MEDICAL HISTORY:  1.  Benign  prostatic hypertrophy.  2.  Hepatitis C virus treated with interferon in 2003.  3.  Depression/anxiety.  4.  Degenerative disk disease of the C-spine.  5.  History of admission for chest pain in 2003 with negative Cardiolite.  6.  Diabetes mellitus.  7.  Right knee arthroscopy surgery.  8.  Suicidal attempt 1996.  9.  Peptic ulcer disease.   ADMISSION LABORATORY DATA:  CBC:  White blood cell count 10.1, hemoglobin  13.8, hematocrit 41.1, MCV 72.2, platelets 209.  PTT was 25, PT 13.1.  Alcohol level less than 5.  Sodium was 139, potassium 4.0, chloride 108,  glucose 108, BUN 12, creatinine 1.1.  Cardiac markers point of care x3  showed elevation of total creatinine kinase and elevation of CK-MB that was  with a tendency to decrease but with normal troponins x3.  Urine drug screen  was positive for cocaine.  TSH 0.623.  Hemoglobin A1c 6.6.  Lipid profile:  Total cholesterol 165, triglycerides 80, HDL 43 and LDL 106.  Total  bilirubin 1.3, alkaline phosphatase 48, AST 27, ALT 18, total protein 6.3,  albumin 4.2.  Calcium 9.2.  Three sets of regular cardiac enzymes that  showed troponin within normal limits.   HOSPITAL COURSE:  Problem 1. The patient when was admitted was still  complaining of chest pain that decreased after the administration of  nitroglycerin drip and morphine.  He was put initially on calcium channel  blockers, aspirin and nitroglycerin drip that was discontinued once the  patient was stable and electrocardiogram did not show any acute changes and  cardiac enzymes were normal.  The chest pain presented recurrently during  this hospitalization, approximately once a day but just for minutes.  Due to  his past medical history and risk factors, cardiology was consulted.  Dr.  Sharyn Lull evaluated the patient and decided to do a nuclear study of his heart  with a stress test that did not show any ischemia and normal wall motion.  It was decided not to continue further workup  with catheterization.  The  patient remained stable during this hospitalization from this point of view,  and he has remained a little bit bradycardic with his pulse around 50-52 and  sometimes going down to 46.  But he is completely asymptomatic from that  point of view.   Problem 2. Diabetes mellitus type 2.  His diabetes was initially treated  with diet and sliding scale insulin.  He has remained relatively stable with  some CBGs around 180 but most of them within normal limits.  It was decided  to send him as an outpatient or in the inpatient facility with Metformin 5  mg p.o. every day plus diet and do a followup as an outpatient of this  disease.   Problem 3. Dyslipidemia.  Even though cholesterol and LDL are slightly  elevated, because he is a diabetic we decided to start a diet on him and  reevaluate lipids in four to six more months.  If he is not at goal, a  statin is going to be added.   Problem 4. Polysubstance abuse and schizophrenia/depression.  Dr. Jeanie Sewer  was consulted for this reason and he decided the patient needs to go to an  inpatient facility to continue his treatment and further diagnosis of his  pathology.   DISCHARGE LABORATORY DATA:  Sodium 140, potassium 3.7, chloride 106,  bicarbonate 29, glucose 110, BUN 10, creatinine 1.1.  Calcium 9.4.   DISPOSITION:  The patient as already stated is to go to an inpatient  facility.  He is to come back from a medical point of view for a medical  followup at our [facility] in two more weeks.       YC/MEDQ  D:  02/25/2005  T:  02/25/2005  Job:  [id number]   cc:   Eduardo Osier. Harwani, M.D.  [address]     [address]  [place]  [place] [address]  Fax: [phone]   Antonietta Breach, M.D.

## 2011-06-17 LAB — DIFFERENTIAL
Basophils Relative: 1
Lymphs Abs: 3.4
Monocytes Absolute: 0.7
Monocytes Relative: 8
Neutro Abs: 4.6

## 2011-06-17 LAB — CBC
Hemoglobin: 15.4
MCHC: 33.7
MCV: 78.7
RBC: 5.81
WBC: 9.1

## 2011-06-17 LAB — LIPASE, BLOOD: Lipase: 28

## 2011-06-17 LAB — COMPREHENSIVE METABOLIC PANEL
ALT: 20
AST: 21
Albumin: 3.8
Alkaline Phosphatase: 59
Calcium: 9.8
GFR calc Af Amer: >60
Potassium: 3.4 — ABNORMAL LOW
Sodium: 138
Total Protein: 6.6

## 2011-06-17 LAB — POCT I-STAT, CHEM 8
Calcium, Ion: 0.92 — ABNORMAL LOW
Chloride: 106
Creatinine, Ser: 1.3
Glucose, Bld: 158 — ABNORMAL HIGH
HCT: 47
Potassium: 6.3

## 2011-06-17 LAB — OCCULT BLOOD X 1 CARD TO LAB, STOOL: Fecal Occult Bld: NEGATIVE

## 2011-06-17 LAB — RAPID URINE DRUG SCREEN, HOSP PERFORMED
Amphetamines: NOT DETECTED
Benzodiazepines: NOT DETECTED
Cocaine: NOT DETECTED

## 2011-06-17 LAB — POCT CARDIAC MARKERS
CKMB, poc: <1 — ABNORMAL LOW
Troponin i, poc: <0.05

## 2011-06-21 LAB — URINALYSIS, ROUTINE W REFLEX MICROSCOPIC
Glucose, UA: NEGATIVE
Leukocytes, UA: NEGATIVE
pH: 6.5

## 2011-06-21 LAB — BASIC METABOLIC PANEL
CO2: 27
Calcium: 9.4
Creatinine, Ser: 1.04
GFR calc Af Amer: >60
GFR calc non Af Amer: >60
Sodium: 138

## 2011-06-21 LAB — DIFFERENTIAL
Basophils Absolute: 0
Basophils Relative: 1
Lymphocytes Relative: 35
Monocytes Relative: 9
Neutro Abs: 4
Neutrophils Relative %: 53

## 2011-06-21 LAB — CBC
Hemoglobin: 15.7
MCHC: 33.6
RBC: 5.85 — ABNORMAL HIGH
RDW: 14

## 2011-06-21 LAB — URINE MICROSCOPIC-ADD ON

## 2011-06-21 LAB — HEPATIC FUNCTION PANEL
ALT: 19
AST: 21
Albumin: 4.1

## 2012-11-23 ENCOUNTER — Encounter (HOSPITAL_COMMUNITY): Payer: Self-pay | Admitting: *Deleted

## 2012-11-23 ENCOUNTER — Emergency Department (HOSPITAL_COMMUNITY)
Admission: EM | Admit: 2012-11-23 | Discharge: 2012-11-25 | Disposition: A | Discharge status: Home / Self Care | Payer: Medicare Other | Attending: Emergency Medicine | Admitting: Emergency Medicine

## 2012-11-23 DIAGNOSIS — R002 Palpitations: Secondary | ICD-10-CM | POA: Insufficient documentation

## 2012-11-23 DIAGNOSIS — Z008 Encounter for other general examination: Secondary | ICD-10-CM | POA: Insufficient documentation

## 2012-11-23 DIAGNOSIS — F1010 Alcohol abuse, uncomplicated: Secondary | ICD-10-CM | POA: Insufficient documentation

## 2012-11-23 DIAGNOSIS — F329 Major depressive disorder, single episode, unspecified: Secondary | ICD-10-CM | POA: Insufficient documentation

## 2012-11-23 DIAGNOSIS — F17200 Nicotine dependence, unspecified, uncomplicated: Secondary | ICD-10-CM | POA: Insufficient documentation

## 2012-11-23 DIAGNOSIS — Z8659 Personal history of other mental and behavioral disorders: Secondary | ICD-10-CM | POA: Insufficient documentation

## 2012-11-23 DIAGNOSIS — F1410 Cocaine abuse, uncomplicated: Secondary | ICD-10-CM

## 2012-11-23 DIAGNOSIS — R61 Generalized hyperhidrosis: Secondary | ICD-10-CM | POA: Insufficient documentation

## 2012-11-23 DIAGNOSIS — R079 Chest pain, unspecified: Secondary | ICD-10-CM | POA: Insufficient documentation

## 2012-11-23 DIAGNOSIS — E119 Type 2 diabetes mellitus without complications: Secondary | ICD-10-CM | POA: Insufficient documentation

## 2012-11-23 DIAGNOSIS — R45851 Suicidal ideations: Secondary | ICD-10-CM | POA: Insufficient documentation

## 2012-11-23 HISTORY — DX: Unspecified psychosis not due to a substance or known physiological condition: F29

## 2012-11-23 HISTORY — DX: Anxiety disorder, unspecified: F41.9

## 2012-11-23 HISTORY — DX: Type 2 diabetes mellitus without complications: E11.9

## 2012-11-23 HISTORY — DX: Major depressive disorder, single episode, unspecified: F32.9

## 2012-11-23 HISTORY — DX: Agoraphobia, unspecified: F40.00

## 2012-11-23 LAB — CBC
HCT: 43.9 % (ref 39.0–52.0)
MCHC: 34.9 g/dL (ref 30.0–36.0)
RDW: 13.3 % (ref 11.5–15.5)

## 2012-11-23 LAB — COMPREHENSIVE METABOLIC PANEL
ALT: 12 U/L (ref 0–53)
AST: 18 U/L (ref 0–37)
Albumin: 4.3 g/dL (ref 3.5–5.2)
Alkaline Phosphatase: 73 U/L (ref 39–117)
BUN: 10 mg/dL (ref 6–23)
Potassium: 3.2 mEq/L — ABNORMAL LOW (ref 3.5–5.1)
Sodium: 136 mEq/L (ref 135–145)
Total Protein: 7.9 g/dL (ref 6.0–8.3)

## 2012-11-23 LAB — ETHANOL: Alcohol, Ethyl (B): <11 mg/dL (ref 0–11)

## 2012-11-23 LAB — RAPID URINE DRUG SCREEN, HOSP PERFORMED
Amphetamines: NOT DETECTED
Barbiturates: NOT DETECTED
Benzodiazepines: NOT DETECTED
Cocaine: POSITIVE — AB

## 2012-11-23 LAB — GLUCOSE, CAPILLARY

## 2012-11-23 MED ORDER — LORAZEPAM 1 MG PO TABS
1.0000 mg | ORAL_TABLET | Freq: Three times a day (TID) | ORAL | Status: DC | PRN
  Administered 2012-11-24: 1 mg via ORAL
  Filled 2012-11-23: qty 1

## 2012-11-23 MED ORDER — ACETAMINOPHEN 325 MG PO TABS: 650.0000 mg | ORAL_TABLET | ORAL | Status: DC | PRN

## 2012-11-23 MED ORDER — NICOTINE 21 MG/24HR TD PT24
21.0000 mg | MEDICATED_PATCH | Freq: Every day | TRANSDERMAL | Status: DC
  Filled 2012-11-23 (×2): qty 1

## 2012-11-23 MED ORDER — IBUPROFEN 600 MG PO TABS
600.0000 mg | ORAL_TABLET | Freq: Three times a day (TID) | ORAL | Status: DC | PRN
  Administered 2012-11-23: 600 mg via ORAL
  Filled 2012-11-23: qty 1

## 2012-11-23 MED ORDER — ZOLPIDEM TARTRATE 10 MG PO TABS: 10.0000 mg | ORAL_TABLET | Freq: Every evening | ORAL | Status: DC | PRN

## 2012-11-23 MED ORDER — ONDANSETRON HCL 4 MG PO TABS: 4.0000 mg | ORAL_TABLET | Freq: Three times a day (TID) | ORAL | Status: DC | PRN

## 2012-11-23 MED ORDER — POTASSIUM CHLORIDE CRYS ER 20 MEQ PO TBCR
40.0000 meq | EXTENDED_RELEASE_TABLET | Freq: Three times a day (TID) | ORAL | Status: AC
  Administered 2012-11-23 – 2012-11-25 (×6): 40 meq via ORAL
  Filled 2012-11-23 (×6): qty 2

## 2012-11-23 MED ORDER — ALUM & MAG HYDROXIDE-SIMETH 200-200-20 MG/5ML PO SUSP: 30.0000 mL | ORAL | Status: DC | PRN

## 2012-11-23 NOTE — Progress Notes (Signed)
Pt confirms he sees [facility] for his pcp EPIC updated

## 2012-11-23 NOTE — ED Notes (Signed)
Pt also sts he is diabetic and has not checked his sugar in months.

## 2012-11-23 NOTE — ED Provider Notes (Signed)
History     CSN: [id number]  Arrival date & time 11/23/12  1049   First MD Initiated Contact with Patient 11/23/12 1114      Chief Complaint  Patient presents with  . Medical Clearance  . Suicidal    (Consider location/radiation/quality/duration/timing/severity/associated sxs/prior treatment) HPI  Patient reports he started drinking when he was 62 years old. He states he has had problems with cocaine for the past 20 years. He states "I'm tired" he also states "the devil will not leave me alone" he also states "I can't stand myself" when asked what that means he states that he can't quit drinking and doing drugs and he feels bad all the time. He reports he recently got evicted from his apartment for not paying his rent and he lost his car for not making his car payments. He states for the past 3-4 days he has felt suicidal. He states last night he spent all the money he had on cocaine because he wanted to "blow my heart up". He states he felt like his heart was racing and he did have some chest pain earlier which he indicates is on his left central chest and was described as sharp, however he states he is not having chest pain now. He states he was having palpitations and feeling his heart race however that is also better now. He denies nausea, vomiting and states he did have a headache but not now. He dates states he was diaphoretic. He states he has not taken any of his medications for at least a year. He states his last detox was 2 years ago when he was sober for about one year. He states he has been sober as long as 6 years in the past.  PCP [facility] in [place]  Past Medical History  Diagnosis Date  . MDD (major depressive disorder)   . Anxiety   . Agoraphobia   . Psychosis   . Diabetes mellitus without complication     Past Surgical History  Procedure Laterality Date  . Cholecystectomy      No family history on file.  History  Substance Use Topics  . Smoking status:  Current Every Day Smoker  . Smokeless tobacco: Not on file  . Alcohol Use: Yes     Comment: at least a 12pk beer a day   Homeless disability   Review of Systems  All other systems reviewed and are negative.    Allergies  Review of patient's allergies indicates no known allergies.  Home Medications  No current outpatient prescriptions on file. No meds for 1 year  BP 131/76  Temp(Src) 98.4 F (36.9 C) (Oral)  Resp 16  SpO2 100%  Vital signs normal    Physical Exam  Nursing note and vitals reviewed. Constitutional: He is oriented to person, place, and time. He appears well-developed and well-nourished.  Non-toxic appearance. He does not appear ill. No distress.  HENT:  Head: Normocephalic and atraumatic.  Right Ear: External ear normal.  Left Ear: External ear normal.  Nose: Nose normal. No mucosal edema or rhinorrhea.  Mouth/Throat: Oropharynx is clear and moist and mucous membranes are normal. No dental abscesses or edematous.  Eyes: Conjunctivae and EOM are normal. Pupils are equal, round, and reactive to light.  Neck: Normal range of motion and full passive range of motion without pain. Neck supple.  Cardiovascular: Normal rate, regular rhythm and normal heart sounds.  Exam reveals no gallop and no friction rub.   No murmur  heard. Pulmonary/Chest: Effort normal and breath sounds normal. No respiratory distress. He has no wheezes. He has no rhonchi. He has no rales. He exhibits no tenderness and no crepitus.    Area of pain noted  Abdominal: Soft. Normal appearance and bowel sounds are normal. He exhibits no distension. There is no tenderness. There is no rebound and no guarding.  Musculoskeletal: Normal range of motion. He exhibits no edema and no tenderness.  Moves all extremities well.   Neurological: He is alert and oriented to person, place, and time. He has normal strength. No cranial nerve deficit.  Skin: Skin is warm, dry and intact. No rash noted. No  erythema. No pallor.  Psychiatric: His speech is normal and behavior is normal. His mood appears not anxious.  tearful    ED Course  Procedures (including critical care time)  Medications  potassium chloride SA (K-DUR,KLOR-CON) CR tablet 40 mEq (not administered)   Pt's hypokalemia was treated with oral potassium  16:00 ACT has not called back after 4 hours  Results for orders placed during the hospital encounter of 11/23/12  COMPREHENSIVE METABOLIC PANEL      Result Value Range   Sodium 136  135 - 145 mEq/L   Potassium 3.2 (*) 3.5 - 5.1 mEq/L   Chloride 95 (*) 96 - 112 mEq/L   CO2 25  19 - 32 mEq/L   Glucose, Bld 199 (*) 70 - 99 mg/dL   BUN 10  6 - 23 mg/dL   Creatinine, Ser 1.61  0.50 - 1.35 mg/dL   Calcium 9.5  8.4 - 09.6 mg/dL   Total Protein 7.9  6.0 - 8.3 g/dL   Albumin 4.3  3.5 - 5.2 g/dL   AST 18  0 - 37 U/L   ALT 12  0 - 53 U/L   Alkaline Phosphatase 73  39 - 117 U/L   Total Bilirubin 0.9  0.3 - 1.2 mg/dL   GFR calc non Af Amer >90  >90 mL/min   GFR calc Af Amer >90  >90 mL/min  ETHANOL      Result Value Range   Alcohol, Ethyl (B) <11  0 - 11 mg/dL  SALICYLATE LEVEL      Result Value Range   Salicylate Lvl <2.0 (*) 2.8 - 20.0 mg/dL  URINE RAPID DRUG SCREEN (HOSP PERFORMED)      Result Value Range   Opiates NONE DETECTED  NONE DETECTED   Cocaine POSITIVE (*) NONE DETECTED   Benzodiazepines NONE DETECTED  NONE DETECTED   Amphetamines NONE DETECTED  NONE DETECTED   Tetrahydrocannabinol NONE DETECTED  NONE DETECTED   Barbiturates NONE DETECTED  NONE DETECTED  CBC      Result Value Range   WBC 6.3  4.0 - 10.5 K/uL   RBC 5.69  4.22 - 5.81 MIL/uL   Hemoglobin 15.3  13.0 - 17.0 g/dL   HCT 04.5  40.9 - 81.1 %   MCV 77.2 (*) 78.0 - 100.0 fL   MCH 26.9  26.0 - 34.0 pg   MCHC 34.9  30.0 - 36.0 g/dL   RDW 91.4  78.2 - 95.6 %   Platelets 237  150 - 400 K/uL  GLUCOSE, CAPILLARY      Result Value Range   Glucose-Capillary 195 (*) 70 - 99 mg/dL   Comment 1  Documented in Chart     Comment 2 Notify RN    POCT I-STAT TROPONIN I      Result Value Range  Troponin i, poc 0.00  0.00 - 0.08 ng/mL   Comment 3            Laboratory interpretation all normal except hypokalemia    Date: 11/23/2012  Rate: 76  Rhythm: normal sinus rhythm  QRS Axis: right  Intervals: normal  ST/T Wave abnormalities: nonspecific T wave changes  Conduction Disutrbances:none  Narrative Interpretation:   Old EKG Reviewed: unchanged from 06/27/2008      1. Alcohol abuse   2. Cocaine abuse   3. Depression   4. Suicidal ideation     Disposition pending  Devoria Albe, MD, FACEP   MDM          Ward Givens, MD 11/23/12 [phone]

## 2012-11-23 NOTE — ED Notes (Signed)
Patient c/o headache, but denies any symptoms of withdraw at this time. Pt pleasant but endorses depression. Pt denies SI at this time.

## 2012-11-23 NOTE — ED Notes (Signed)
Pt presents requesting medical clearance and "needing to talk to someone real bad." Reports multiple mental illnesses, MDD, anxiety, agoraphobia. Reports he has not seen mental health or had his meds in over a year, "just stopped taking them." Reports addiction problems, etoh and crack. Last drink 0300 this am. Usually drinks at least a 12pk of beer. Last used crack this am. Has spent over $2000 on crack in last 2 weeks. Sts he "used so much last night, tried to make my heart blow up but it didn't. Just made it beat real fast." Reports SI, with plan to jump off a local bridge. Sts "I make almost $48000 a year but all I have are the clothes on my back." Denies HI.

## 2012-11-23 NOTE — BH Assessment (Signed)
Assessment Note   Troy Little is an 62 y.o. male. Pt presents to [facility] requesting medical clearance and "needing to talk to someone real bad." Reports multiple mental illnesses, MDD, anxiety, and per ED notes agoraphobia. Reports he has been to the [place] [facility] in the past for mental health treatment. He was also prescribed 2 medications. He recalls Trazodone as one of those medications and not remembering the other. He has not had any medications in over a year, "just stopped taking them." Reports addiction problems, alcohol and crack cocaine. Last drink 0300 this am. Usually drinks at least a 12 pack of beer per day and reports consistent usage for the 2-3 weeks. Last used crack this am. Has spent over $2000 on crack in last 2 weeks. Sts he "used so much last night, tried to make his heart blow up but it didn't. Just made it beat real fast." Reports SI, with plan to jump off a local bridge. He reports 3 prior suicide attempts; 2 overdoses and 1 attempt to walk in front of transfer trailer truck. Patient hospitalized at several facilities including the [place] [facility] and [facility], facility in [place], and facility in [place]. Patient denies AVH's however admits to a previous history of psychotic symptom; last report symptoms yrs ago. No HI.    Axis I: Major Depression, Recurrent severe without psychotic features; Alcohol Dependence; Cocaine Abuse Axis II: Deferred Axis III:  Past Medical History  Diagnosis Date  . MDD (major depressive disorder)   . Anxiety   . Agoraphobia   . Psychosis   . Diabetes mellitus without complication    Axis IV: housing problems, other psychosocial or environmental problems, problems related to social environment, problems with access to health care services and problems with primary support group Axis V: 31-40 impairment in reality testing  Past Medical History:  Past Medical History  Diagnosis Date  . MDD (major  depressive disorder)   . Anxiety   . Agoraphobia   . Psychosis   . Diabetes mellitus without complication     Past Surgical History  Procedure Laterality Date  . Cholecystectomy      Family History: No family history on file.  Social History:  reports that he has been smoking.  He does not have any smokeless tobacco history on file. He reports that  drinks alcohol. He reports that he uses illicit drugs (Cocaine).  Additional Social History:  Alcohol / Drug Use Pain Medications: SEE MAR Prescriptions: SEE MAR Over the Counter: SEE MAR  History of alcohol / drug use?: Yes Substance #1 Name of Substance 1: Crack Cocaine-(Inhales) 1 - Age of First Use: 62 yrs old 1 - Amount (size/oz): daily, $2000 in the last 2 weeks; pt does not know how much he spent per day 1 - Frequency: daily  1 - Duration: on-going daily since age 24 1 - Last Use / Amount: 11/23/2012; amount unk Substance #2 Name of Substance 2: Alcohol 2 - Age of First Use: 62 yrs old  2 - Amount (size/oz): 12 pack per day 2 - Frequency: daily for the past 2-3 weeks 2 - Duration: on-going 2 - Last Use / Amount: 11/23/2012; pt drank 3 beers this morning  CIWA: CIWA-Ar BP: 131/76 mmHg COWS:    Allergies: No Known Allergies  Home Medications:  (Not in a hospital admission)  OB/GYN Status:  No LMP for male patient.  General Assessment Data Location of Assessment: [facility] Living Arrangements: Other (Comment) (homeless in   [place]) Can pt return to current living arrangement?: Yes Admission Status: Voluntary Is patient capable of signing voluntary admission?: Yes Transfer from: Acute Hospital Referral Source: Self/Family/Friend  Education Status Is patient currently in school?: No  Risk to self Suicidal Ideation: Yes-Currently Present Suicidal Intent: Yes-Currently Present Is patient at risk for suicide?: Yes Suicidal Plan?: Yes-Currently Present Specify Current Suicidal Plan:  (Jump off a bridge) Access  to Means: Yes Specify Access to Suicidal Means:  (Access to a bridge and ability to jump off a bridge) What has been your use of drugs/alcohol within the last 12 months?:  (crack cocaine and alcohol) Previous Attempts/Gestures: Yes How many times?:  (3x's- pt reports 2 overdoses and steping in front of a trunk) Other Self Harm Risks: n/a Triggers for Past Attempts:  ("I just didn't feel like living no particular reason") Intentional Self Injurious Behavior: None Family Suicide History: No Recent stressful life event(s): Other (Comment) ("I've been off my meds x1 yr that's all") Persecutory voices/beliefs?: No Depression: Yes Depression Symptoms: Feeling angry/irritable;Feeling worthless/self pity;Loss of interest in usual pleasures;Guilt;Fatigue;Isolating;Despondent;Insomnia;Tearfulness Substance abuse history and/or treatment for substance abuse?: No Suicide prevention information given to non-admitted patients: Not applicable  Risk to Others Homicidal Ideation: No-Not Currently/Within Last 6 Months Thoughts of Harm to Others: No-Not Currently Present/Within Last 6 Months Current Homicidal Intent: No-Not Currently/Within Last 6 Months Current Homicidal Plan: No-Not Currently/Within Last 6 Months Access to Homicidal Means: No Identified Victim:  (n/a) History of harm to others?: No Assessment of Violence: None Noted Violent Behavior Description:  (patient calm and cooperative) Does patient have access to weapons?: No Criminal Charges Pending?: No Does patient have a court date: No  Psychosis Hallucinations: None noted Delusions: None noted  Mental Status Report Appear/Hygiene: Disheveled Eye Contact: Good Motor Activity: Freedom of movement Speech: Logical/coherent Level of Consciousness: Alert Mood: Sad;Depressed Affect: Other (Comment) (flat) Anxiety Level: None Thought Processes: Coherent;Relevant Judgement: Impaired Orientation: Person;Place;Time;Situation Obsessive  Compulsive Thoughts/Behaviors: None  Cognitive Functioning Concentration: Normal Memory: Recent Intact;Remote Intact IQ: Average Insight: Fair Impulse Control: Fair Appetite: Poor Weight Loss:  (none reported) Weight Gain:  (n/a) Sleep: Decreased Total Hours of Sleep:  (3 hours) Vegetative Symptoms: None  ADLScreening [facility]) Patient's cognitive ability adequate to safely complete daily activities?: Yes Patient able to express need for assistance with ADLs?: Yes Independently performs ADLs?: Yes (appropriate for developmental age)  Abuse/Neglect [facility]) Physical Abuse: Denies Verbal Abuse: Denies Sexual Abuse: Denies  Prior Inpatient Therapy Prior Inpatient Therapy: Yes Prior Therapy Dates:  (patient unable to recall specific dates) Prior Therapy Facilty/Provider(s):  ([facility], hospital in [place], [place] [facility], [place]) Reason for Treatment:  (depression, suicidal, substance abuse)  Prior Outpatient Therapy Prior Outpatient Therapy: Yes Prior Therapy Dates:  (currently and [facility]  [facility] Reason for Treatment:  (medication mangement)  ADL Screening (condition at time of admission) Patient's cognitive ability adequate to safely complete daily activities?: Yes Patient able to express need for assistance with ADLs?: Yes Independently performs ADLs?: Yes (appropriate for developmental age) Weakness of Arms/Hands: None  Home Assistive Devices/Equipment Home Assistive Devices/Equipment: None    Abuse/Neglect Assessment (Assessment to be complete while patient is alone) Physical Abuse: Denies Verbal Abuse: Denies Sexual Abuse: Denies Exploitation of patient/patient's resources: Denies Self-Neglect: Denies Values / Beliefs Cultural Requests During Hospitalization: None Spiritual Requests During Hospitalization: None   Advance Directives (For  Healthcare) Advance Directive: Patient does not have  advance directive Nutrition Screen- [facility] Adult/[facility] Patient's home diet: Regular  Additional Information 1:1 In Past 12 Months?: No CIRT Risk: No Elopement Risk: No Does patient have medical clearance?: Yes     Disposition:  Disposition Initial Assessment Completed: Yes Disposition of Patient: Inpatient treatment program Type of inpatient treatment program: Adult  On Site Evaluation by:   Reviewed with Physician:     Melynda Ripple [facility] 11/23/2012 5:47 PM

## 2012-11-23 NOTE — Progress Notes (Signed)
Per telepsych, recommended for inpatient. CSW completed [facility] paperwork, discussed with  act team, agree  to follow up with completing referral process.   Catha Gosselin, LCSWA  [phone] .11/23/2012 1125

## 2012-11-24 LAB — POCT I-STAT, CHEM 8
BUN: 12 mg/dL (ref 6–23)
Calcium, Ion: 1.24 mmol/L — ABNORMAL HIGH (ref 1.12–1.23)
Chloride: 103 mEq/L (ref 96–112)
Creatinine, Ser: 0.9 mg/dL (ref 0.50–1.35)
Glucose, Bld: 285 mg/dL — ABNORMAL HIGH (ref 70–99)
HCT: 43 % (ref 39.0–52.0)
Potassium: 4.4 mEq/L (ref 3.5–5.1)

## 2012-11-24 MED ORDER — ESCITALOPRAM OXALATE 10 MG PO TABS
20.0000 mg | ORAL_TABLET | Freq: Every day | ORAL | Status: DC
  Administered 2012-11-24 – 2012-11-25 (×2): 20 mg via ORAL
  Filled 2012-11-24 (×2): qty 2

## 2012-11-24 MED ORDER — METFORMIN HCL 500 MG PO TABS
1000.0000 mg | ORAL_TABLET | Freq: Two times a day (BID) | ORAL | Status: DC
  Administered 2012-11-24 – 2012-11-25 (×2): 1000 mg via ORAL
  Filled 2012-11-24 (×4): qty 2

## 2012-11-24 NOTE — ED Provider Notes (Signed)
Pt sleeping; RNs report no concerns overnight. Tele-Psych recs Lexapro (ordered). Tele-Psych recs transfer to [place] in [place]. Pt with SI/SA EtOH and crack abuse reason for ED visit.  Hurman Horn, MD 11/25/12 [phone]

## 2012-11-24 NOTE — ED Notes (Signed)
Notified EDP about pt.'s Glucose of 285, pt. statest that he takes Metformin, 1000mg  BID.

## 2012-11-24 NOTE — Clinical Social Work Note (Addendum)
1:56pm- CSW received a call from Richard at [facility] who stated that physician would need to sign page 2 of the packet and [facility] would need vitals.  CSW will have physician sign and re-fax.  CSW faxed VA transfer request to [phone].    Vickii Penna, LCSWA [phone]  Clinical Social Work

## 2012-11-24 NOTE — Progress Notes (Signed)
Pt accepted to [facility] [place], accepting DR. Reedling. Patient will be transported to the [facility] emergency room and [facility]. Pt nurse can call report to [phone] ext. [phone]. Patient will be under ivc for safety of transportation.   Catha Gosselin, LCSWA  [phone] .11/24/2012 9:16pm

## 2012-11-25 MED ORDER — PANTOPRAZOLE SODIUM 40 MG PO TBEC
40.0000 mg | DELAYED_RELEASE_TABLET | Freq: Every day | ORAL | Status: DC
  Administered 2012-11-25: 40 mg via ORAL
  Filled 2012-11-25: qty 1

## 2012-11-25 NOTE — ED Notes (Signed)
Pt left via [facility] to be transported to [place] in [place]

## 2012-11-25 NOTE — ED Notes (Signed)
Called report to [place] in [place], report given to accepting RN, Kathryne Sharper transport called and said they were coming to pick up pt for transport soon, notified pt, pt taking shower now.

## 2012-11-25 NOTE — ED Provider Notes (Signed)
BP 106/66  Pulse 56  Temp(Src) 97.8 F (36.6 C) (Oral)  Resp 18  SpO2 99% No new issues overnight. Resting well.   Loren Racer, MD 11/25/12 [phone]

## 2013-05-18 ENCOUNTER — Emergency Department (HOSPITAL_COMMUNITY)
Admission: EM | Admit: 2013-05-18 | Discharge: 2013-05-18 | Disposition: A | Discharge status: Home / Self Care | Payer: Medicare Other | Attending: Emergency Medicine | Admitting: Emergency Medicine

## 2013-05-18 ENCOUNTER — Encounter (HOSPITAL_COMMUNITY): Payer: Self-pay | Admitting: Emergency Medicine

## 2013-05-18 DIAGNOSIS — R0789 Other chest pain: Secondary | ICD-10-CM | POA: Insufficient documentation

## 2013-05-18 DIAGNOSIS — F17200 Nicotine dependence, unspecified, uncomplicated: Secondary | ICD-10-CM | POA: Insufficient documentation

## 2013-05-18 DIAGNOSIS — Z7982 Long term (current) use of aspirin: Secondary | ICD-10-CM | POA: Insufficient documentation

## 2013-05-18 DIAGNOSIS — G20A1 Parkinson's disease without dyskinesia, without mention of fluctuations: Secondary | ICD-10-CM | POA: Insufficient documentation

## 2013-05-18 DIAGNOSIS — G20 Parkinson's disease: Secondary | ICD-10-CM | POA: Insufficient documentation

## 2013-05-18 DIAGNOSIS — F411 Generalized anxiety disorder: Secondary | ICD-10-CM | POA: Insufficient documentation

## 2013-05-18 DIAGNOSIS — E119 Type 2 diabetes mellitus without complications: Secondary | ICD-10-CM | POA: Insufficient documentation

## 2013-05-18 DIAGNOSIS — Z8659 Personal history of other mental and behavioral disorders: Secondary | ICD-10-CM | POA: Insufficient documentation

## 2013-05-18 DIAGNOSIS — R739 Hyperglycemia, unspecified: Secondary | ICD-10-CM

## 2013-05-18 DIAGNOSIS — H538 Other visual disturbances: Secondary | ICD-10-CM | POA: Insufficient documentation

## 2013-05-18 DIAGNOSIS — F329 Major depressive disorder, single episode, unspecified: Secondary | ICD-10-CM | POA: Insufficient documentation

## 2013-05-18 DIAGNOSIS — Z794 Long term (current) use of insulin: Secondary | ICD-10-CM | POA: Insufficient documentation

## 2013-05-18 DIAGNOSIS — Z79899 Other long term (current) drug therapy: Secondary | ICD-10-CM | POA: Insufficient documentation

## 2013-05-18 HISTORY — DX: Parkinson's disease without dyskinesia, without mention of fluctuations: G20.A1

## 2013-05-18 HISTORY — DX: Parkinson's disease: G20

## 2013-05-18 LAB — BASIC METABOLIC PANEL
CO2: 27 mEq/L (ref 19–32)
Calcium: 9.9 mg/dL (ref 8.4–10.5)
Creatinine, Ser: 0.74 mg/dL (ref 0.50–1.35)
Glucose, Bld: 259 mg/dL — ABNORMAL HIGH (ref 70–99)
Sodium: 133 mEq/L — ABNORMAL LOW (ref 135–145)

## 2013-05-18 LAB — POCT I-STAT TROPONIN I: Troponin i, poc: 0.02 ng/mL (ref 0.00–0.08)

## 2013-05-18 LAB — CBC
Hemoglobin: 13 g/dL (ref 13.0–17.0)
MCH: 25.9 pg — ABNORMAL LOW (ref 26.0–34.0)
MCV: 76.3 fL — ABNORMAL LOW (ref 78.0–100.0)
RBC: 5.02 MIL/uL (ref 4.22–5.81)

## 2013-05-18 MED ORDER — METFORMIN HCL 500 MG PO TABS
500.0000 mg | ORAL_TABLET | Freq: Once | ORAL | Status: AC
  Administered 2013-05-18: 500 mg via ORAL
  Filled 2013-05-18: qty 1

## 2013-05-18 NOTE — ED Notes (Signed)
Pt reports that he is out of his DM meds for "a couple of weeks"  and "ate a lot of sugar today." Pt adds that he is having CP because "ya'll will see me quicker." Pt reports that he thinks he may have gas from eating all the food. Pt denies SOB, N/V, Dizziness, but states he has vision changes with "cloudiness and dots." PT is A&O and in NAD

## 2013-05-18 NOTE — ED Provider Notes (Signed)
CSN: [id number]     Arrival date & time 05/18/13  1830 History   First MD Initiated Contact with Patient 05/18/13 1905     Chief Complaint  Patient presents with  . Hyperglycemia  . Chest Pain   (Consider location/radiation/quality/duration/timing/severity/associated sxs/prior Treatment) Patient is a 62 y.o. male presenting with hyperglycemia and chest pain. The history is provided by the patient.  Hyperglycemia Blood sugar level PTA:  220s Severity:  Mild Onset quality:  Gradual Timing:  Constant Progression:  Unchanged Chronicity:  Recurrent Diabetes status:  Controlled with insulin and controlled with oral medications Current diabetic therapy:  Metformin, Glipizide, SSI Context: noncompliance   Context: not change in medication, not new diabetes diagnosis, not recent change in diet and not recent illness   Relieved by:  Nothing Ineffective treatments:  None tried Associated symptoms: blurred vision (mild, brief blurry vision that patient associated with elevated blood sugars) and chest pain   Associated symptoms: no abdominal pain, no dysuria, no fatigue, no nausea, no polyuria and no shortness of breath   Chest pain:    Quality:  Sharp   Severity:  Mild   Onset quality:  Sudden   Duration: 15 minutes.   Timing:  Rare   Progression:  Resolved   Chronicity:  Recurrent Chest Pain Associated symptoms: no abdominal pain, no fatigue, no nausea and no shortness of breath     Past Medical History  Diagnosis Date  . MDD (major depressive disorder)   . Anxiety   . Agoraphobia   . Psychosis   . Diabetes mellitus without complication   . Parkinson's disease    Past Surgical History  Procedure Laterality Date  . Cholecystectomy     No family history on file. History  Substance Use Topics  . Smoking status: Current Every Day Smoker -- 0.50 packs/day    Types: Cigarettes  . Smokeless tobacco: Not on file  . Alcohol Use: No    Review of Systems  Constitutional:  Negative for fatigue.  Eyes: Positive for blurred vision (mild, brief blurry vision that patient associated with elevated blood sugars).  Respiratory: Negative for shortness of breath.   Cardiovascular: Positive for chest pain.  Gastrointestinal: Negative for nausea and abdominal pain.  Endocrine: Negative for polyuria.  Genitourinary: Negative for dysuria.  All other systems reviewed and are negative.    Allergies  Review of patient's allergies indicates no known allergies.  Home Medications   Current Outpatient Rx  Name  Route  Sig  Dispense  Refill  . aspirin EC 81 MG tablet   Oral   Take 81 mg by mouth daily.         . diphenhydrAMINE (BENADRYL) 50 MG tablet   Oral   Take 50 mg by mouth at bedtime as needed for itching.         Marland Kitchen glipiZIDE (GLUCOTROL) 5 MG tablet   Oral   Take 5 mg by mouth 2 (two) times daily before a meal.         . lisinopril (PRINIVIL,ZESTRIL) 20 MG tablet   Oral   Take 20 mg by mouth daily.         . metFORMIN (GLUCOPHAGE) 500 MG tablet   Oral   Take 500 mg by mouth 2 (two) times daily with a meal.         . primidone (MYSOLINE) 50 MG tablet   Oral   Take 50 mg by mouth 3 (three) times daily.         Marland Kitchen  risperiDONE (RISPERDAL) 2 MG tablet   Oral   Take 2 mg by mouth daily.         . simvastatin (ZOCOR) 20 MG tablet   Oral   Take 20 mg by mouth every evening.         . traZODone (DESYREL) 150 MG tablet   Oral   Take 150 mg by mouth 2 (two) times daily.          BP 130/76  Pulse 58  Temp(Src) 99 F (37.2 C) (Oral)  Resp 16  SpO2 98% Physical Exam  Nursing note and vitals reviewed. Constitutional: He is oriented to person, place, and time. He appears well-developed and well-nourished. No distress.  HENT:  Head: Normocephalic and atraumatic.  Mouth/Throat: No oropharyngeal exudate.  Eyes: EOM are normal. Pupils are equal, round, and reactive to light.  Neck: Normal range of motion. Neck supple.   Cardiovascular: Normal rate and regular rhythm.  Exam reveals no friction rub.   No murmur heard. Pulmonary/Chest: Effort normal and breath sounds normal. No respiratory distress. He has no wheezes. He has no rales.  Abdominal: He exhibits no distension. There is no tenderness. There is no rebound.  Musculoskeletal: Normal range of motion. He exhibits no edema.  Neurological: He is alert and oriented to person, place, and time.  Skin: He is not diaphoretic.    ED Course  Procedures (including critical care time) Labs Review Labs Reviewed  CBC - Abnormal; Notable for the following:    HCT 38.3 (*)    MCV 76.3 (*)    MCH 25.9 (*)    All other components within normal limits  BASIC METABOLIC PANEL - Abnormal; Notable for the following:    Sodium 133 (*)    Glucose, Bld 259 (*)    All other components within normal limits  GLUCOSE, CAPILLARY - Abnormal; Notable for the following:    Glucose-Capillary 226 (*)    All other components within normal limits  POCT I-STAT TROPONIN I  POCT I-STAT TROPONIN I   Imaging Review No results found.   Date: 05/18/2013  Rate: 57  Rhythm: sinus bradycardia  QRS Axis: normal  Intervals: normal  ST/T Wave abnormalities: normal  Conduction Disutrbances:none  Narrative Interpretation:   Old EKG Reviewed: unchanged   MDM   1. Hyperglycemia     62 year old male presents with hyperglycemia and chest pain Hyperglycemia began today. He said his blood sugars in the 200s. He is on metformin, glipizide, slides: Son for his diabetes. His been noncompliant for the past 2 days. He reports eating a lot of sweets including sweet tea, cookies, ice cream, candy, watermelon today. Patient really only wants metformin. Asked him if he wants any further medications into the weekend, he states "No I am able to get these medications on Tuesday by the [place] and I will not eating more sweets the weekend". Patient really does want him to bring his sugars down. He is  not taking his metformin and glipizide or insulin in several days. I will give him on metformin here. Again he refused any further prescriptions to make him weak and is he is out of his medications. Patient's chest pain was left central sharp and nonradiating. He reports that she has gas. He's had this multiple times before. He states that he "gassed" and then all the pain left. He is not having chest pain at this time. Informed him I am concerned that he could be having a heart attack to his  diabetes as a risk factor in his older age, however he does not want any workup until he can get at the [place] on Tuesday. Patient above is of sound mind and is alert and oriented x3. Patient is aware that not checking them out for a heart attack could result in death. Patient amenable to delta trop - trops detectable but not elevated - 0.02 and then 0.03. Stable for discharge, no further chest pain. In[facility] after the weekend.    Dagmar Hait, MD 05/18/13 2322

## 2013-07-03 ENCOUNTER — Encounter (HOSPITAL_COMMUNITY): Payer: Self-pay | Admitting: Emergency Medicine

## 2013-07-03 ENCOUNTER — Emergency Department (HOSPITAL_COMMUNITY)
Admission: EM | Admit: 2013-07-03 | Discharge: 2013-07-03 | Discharge status: Against Medical Advice | Payer: Medicare Other | Attending: Emergency Medicine | Admitting: Emergency Medicine

## 2013-07-03 DIAGNOSIS — Z760 Encounter for issue of repeat prescription: Secondary | ICD-10-CM | POA: Insufficient documentation

## 2013-07-03 DIAGNOSIS — E119 Type 2 diabetes mellitus without complications: Secondary | ICD-10-CM | POA: Insufficient documentation

## 2013-07-03 DIAGNOSIS — G20 Parkinson's disease: Secondary | ICD-10-CM | POA: Insufficient documentation

## 2013-07-03 DIAGNOSIS — F17200 Nicotine dependence, unspecified, uncomplicated: Secondary | ICD-10-CM | POA: Insufficient documentation

## 2013-07-03 DIAGNOSIS — G20A1 Parkinson's disease without dyskinesia, without mention of fluctuations: Secondary | ICD-10-CM | POA: Insufficient documentation

## 2013-07-03 LAB — GLUCOSE, CAPILLARY: Glucose-Capillary: 191 mg/dL — ABNORMAL HIGH (ref 70–99)

## 2013-07-03 NOTE — ED Notes (Signed)
Pt decided not to wait and will f/u with [facility] tomorrow.l

## 2013-07-03 NOTE — ED Notes (Signed)
Per pt, felt that cbg was elevated d/t c/o sweating and frequent urination.  Pt cbg 191 in triage.  [facility] and has been out of meds for a while.

## 2014-01-11 ENCOUNTER — Emergency Department (HOSPITAL_COMMUNITY)
Admission: EM | Admit: 2014-01-11 | Discharge: 2014-01-11 | Disposition: A | Discharge status: Home / Self Care | Payer: Medicare Other | Attending: Emergency Medicine | Admitting: Emergency Medicine

## 2014-01-11 ENCOUNTER — Encounter (HOSPITAL_COMMUNITY): Payer: Self-pay | Admitting: Emergency Medicine

## 2014-01-11 DIAGNOSIS — R221 Localized swelling, mass and lump, neck: Secondary | ICD-10-CM

## 2014-01-11 DIAGNOSIS — F329 Major depressive disorder, single episode, unspecified: Secondary | ICD-10-CM | POA: Insufficient documentation

## 2014-01-11 DIAGNOSIS — F17200 Nicotine dependence, unspecified, uncomplicated: Secondary | ICD-10-CM | POA: Insufficient documentation

## 2014-01-11 DIAGNOSIS — R220 Localized swelling, mass and lump, head: Secondary | ICD-10-CM | POA: Insufficient documentation

## 2014-01-11 DIAGNOSIS — E119 Type 2 diabetes mellitus without complications: Secondary | ICD-10-CM | POA: Insufficient documentation

## 2014-01-11 DIAGNOSIS — F411 Generalized anxiety disorder: Secondary | ICD-10-CM | POA: Insufficient documentation

## 2014-01-11 DIAGNOSIS — L252 Unspecified contact dermatitis due to dyes: Secondary | ICD-10-CM

## 2014-01-11 DIAGNOSIS — F29 Unspecified psychosis not due to a substance or known physiological condition: Secondary | ICD-10-CM | POA: Insufficient documentation

## 2014-01-11 DIAGNOSIS — G20 Parkinson's disease: Secondary | ICD-10-CM | POA: Insufficient documentation

## 2014-01-11 DIAGNOSIS — L258 Unspecified contact dermatitis due to other agents: Secondary | ICD-10-CM | POA: Insufficient documentation

## 2014-01-11 DIAGNOSIS — Z79899 Other long term (current) drug therapy: Secondary | ICD-10-CM | POA: Insufficient documentation

## 2014-01-11 DIAGNOSIS — Z791 Long term (current) use of non-steroidal anti-inflammatories (NSAID): Secondary | ICD-10-CM | POA: Insufficient documentation

## 2014-01-11 DIAGNOSIS — Z7982 Long term (current) use of aspirin: Secondary | ICD-10-CM | POA: Insufficient documentation

## 2014-01-11 DIAGNOSIS — G20A1 Parkinson's disease without dyskinesia, without mention of fluctuations: Secondary | ICD-10-CM | POA: Insufficient documentation

## 2014-01-11 MED ORDER — CLOBETASOL PROPIONATE 0.05 % EX CREA: TOPICAL_CREAM | CUTANEOUS | Status: DC

## 2014-01-11 MED ORDER — CETIRIZINE HCL 10 MG PO TABS: ORAL_TABLET | ORAL | Status: DC

## 2014-01-11 NOTE — ED Notes (Signed)
MD at bedside. 

## 2014-01-11 NOTE — ED Notes (Signed)
Came in with complaint of allergic reaction after applying dye on his mustache at 8pm last night, claimed of swelling on upper lips and chin, denies pain ,no SOB.

## 2014-01-11 NOTE — ED Provider Notes (Signed)
CSN: [id number]     Arrival date & time 01/11/14  0040 History   First MD Initiated Contact with Patient 01/11/14 0127     Chief Complaint  Patient presents with  . Allergic Reaction     (Consider location/radiation/quality/duration/timing/severity/associated sxs/prior Treatment) HPI This is a 63 year old male who apply "Just for [employer]" hair dye to his beard and mustache about 8 PM yesterday. He is subsequently developed swelling, erythema, itching and a tight sensation in the areas exposed. The symptoms are moderate and somewhat worse with palpation. He denies systemic symptoms. He has never tried this product in the past.  Past Medical History  Diagnosis Date  . MDD (major depressive disorder)   . Anxiety   . Agoraphobia   . Psychosis   . Diabetes mellitus without complication   . Parkinson's disease    Past Surgical History  Procedure Laterality Date  . Cholecystectomy     History reviewed. No pertinent family history. History  Substance Use Topics  . Smoking status: Current Every Day Smoker -- 0.50 packs/day    Types: Cigarettes  . Smokeless tobacco: Not on file  . Alcohol Use: No    Review of Systems  All other systems reviewed and are negative.  Allergies  Dye fdc red  Home Medications   Prior to Admission medications   Medication Sig Start Date End Date Taking? Authorizing Provider  aspirin EC 81 MG tablet Take 81 mg by mouth daily.   Yes Historical Provider, MD  diclofenac (VOLTAREN) 50 MG EC tablet Take 50 mg by mouth 2 (two) times daily.   Yes Historical Provider, MD  diphenhydrAMINE (BENADRYL) 50 MG tablet Take 50 mg by mouth at bedtime as needed for itching.   Yes Historical Provider, MD  ibuprofen (ADVIL,MOTRIN) 200 MG tablet Take 200 mg by mouth every 6 (six) hours as needed for moderate pain.   Yes Historical Provider, MD  metFORMIN (GLUCOPHAGE) 500 MG tablet Take 500 mg by mouth 2 (two) times daily with a meal.   Yes Historical Provider, MD   PRESCRIPTION MEDICATION Take 1 tablet by mouth 2 (two) times daily. Medication that replaced trazodone. Pt. Unsure of name   Yes Historical Provider, MD  primidone (MYSOLINE) 50 MG tablet Take 50 mg by mouth 3 (three) times daily.   Yes Historical Provider, MD  risperiDONE (RISPERDAL) 2 MG tablet Take 2 mg by mouth daily.   Yes Historical Provider, MD   BP 147/81  Pulse 67  Temp(Src) 97.8 F (36.6 C) (Oral)  Resp 18  SpO2 97%  Physical Exam General: Well-developed, well-nourished male in no acute distress; appearance consistent with age of record HENT: normocephalic; atraumatic Eyes: pupils equal, round and reactive to light; extraocular muscles intact Neck: supple Heart: regular rate and rhythm; faint systolic murmur Lungs: clear to auscultation bilaterally Abdomen: soft; nondistended Extremities: No deformity; full range of motion; pulses normal Neurologic: Awake, alert; motor function intact in all extremities and symmetric; no facial droop Skin: Warm and dry; edema and mild erythema of skin underlying his beard and mustache Psychiatric: Flat affect    ED Course  Procedures (including critical care time)   MDM      Wynetta Fines, MD 01/11/14 0134

## 2014-01-11 NOTE — Discharge Instructions (Signed)
Contact Dermatitis Contact dermatitis is a rash that happens when something touches the skin. You touched something that irritates your skin, or you have allergies to something you touched. HOME CARE   Avoid the thing that caused your rash.  Keep your rash away from hot water, soap, sunlight, chemicals, and other things that might bother it.  Do not scratch your rash.  You can take cool baths to help stop itching.  Only take medicine as told by your doctor.  Keep all doctor visits as told. GET HELP RIGHT AWAY IF:   Your rash is not better after 3 days.  Your rash gets worse. MAKE SURE YOU:   Understand these instructions.  Will watch your condition.  Will get help right away if you are not doing well or get worse. Document Released: 07/04/2009 Document Revised: 11/29/2011 Document Reviewed: 02/09/2011 [facility] Patient Information 2014 [place], [place].

## 2015-03-03 ENCOUNTER — Encounter (HOSPITAL_COMMUNITY): Payer: Self-pay | Admitting: *Deleted

## 2015-03-03 ENCOUNTER — Emergency Department (HOSPITAL_COMMUNITY)
Admission: EM | Admit: 2015-03-03 | Discharge: 2015-03-03 | Disposition: A | Discharge status: Home / Self Care | Payer: Medicare Other | Attending: Emergency Medicine | Admitting: Emergency Medicine

## 2015-03-03 DIAGNOSIS — Z791 Long term (current) use of non-steroidal anti-inflammatories (NSAID): Secondary | ICD-10-CM | POA: Insufficient documentation

## 2015-03-03 DIAGNOSIS — M549 Dorsalgia, unspecified: Secondary | ICD-10-CM | POA: Insufficient documentation

## 2015-03-03 DIAGNOSIS — M79602 Pain in left arm: Secondary | ICD-10-CM | POA: Diagnosis not present

## 2015-03-03 DIAGNOSIS — F329 Major depressive disorder, single episode, unspecified: Secondary | ICD-10-CM | POA: Insufficient documentation

## 2015-03-03 DIAGNOSIS — Z79899 Other long term (current) drug therapy: Secondary | ICD-10-CM | POA: Diagnosis not present

## 2015-03-03 DIAGNOSIS — G20 Parkinson's disease: Secondary | ICD-10-CM | POA: Diagnosis not present

## 2015-03-03 DIAGNOSIS — Z720 Tobacco use: Secondary | ICD-10-CM | POA: Insufficient documentation

## 2015-03-03 DIAGNOSIS — Z7982 Long term (current) use of aspirin: Secondary | ICD-10-CM | POA: Diagnosis not present

## 2015-03-03 DIAGNOSIS — E119 Type 2 diabetes mellitus without complications: Secondary | ICD-10-CM | POA: Insufficient documentation

## 2015-03-03 DIAGNOSIS — F419 Anxiety disorder, unspecified: Secondary | ICD-10-CM | POA: Insufficient documentation

## 2015-03-03 DIAGNOSIS — M5412 Radiculopathy, cervical region: Secondary | ICD-10-CM | POA: Insufficient documentation

## 2015-03-03 DIAGNOSIS — M542 Cervicalgia: Secondary | ICD-10-CM | POA: Diagnosis present

## 2015-03-03 MED ORDER — OXYCODONE-ACETAMINOPHEN 5-325 MG PO TABS: 1.0000 | ORAL_TABLET | Freq: Four times a day (QID) | ORAL | Status: DC | PRN

## 2015-03-03 NOTE — ED Notes (Signed)
NAD at this time. Pt is stable and going home.  

## 2015-03-03 NOTE — Discharge Instructions (Signed)

## 2015-03-03 NOTE — ED Provider Notes (Signed)
CSN: [id number]     Arrival date & time 03/03/15  7628 History   First MD Initiated Contact with Patient 03/03/15 [phone]     Chief Complaint  Patient presents with  . Arm Pain     Patient is a 64 y.o. male presenting with arm pain. The history is provided by the patient. No language interpreter was used.  Arm Pain   Troy Little presents for evaluation of pain in the back of his neck, radiating to his left arm.  He has a hx/o pain and problems in his neck since 1974.  He has sharp, stabbing/burning/aching pain in his cspine (diffusely), radiating to his posterior shoulder and down his arm to his wrist and dorsal hand on the left.  The pain is constant in nature and gets worse at times, particularly with lying on the left side.  He has decreased ROM in the left shoulder/LUE due to recurrent pain.  He cannot lift his arm above his head, put it behind his back, or reach across his chest.  He denies any new trauma, fevers, chest pain, sob, weakness.  He has a tingling sensation in his left hand that has been ongoing for several years.  He has been evaluated for neck problems before MRI, last one being a year ago.  He is currently followed at the [place] and presents today because it is a week before his appointment and he has increased pain in his shoulder (new for the last three weeks).    Past Medical History  Diagnosis Date  . MDD (major depressive disorder)   . Anxiety   . Agoraphobia   . Psychosis   . Diabetes mellitus without complication   . Parkinson's disease    Past Surgical History  Procedure Laterality Date  . Cholecystectomy     No family history on file. History  Substance Use Topics  . Smoking status: Current Every Day Smoker -- 0.50 packs/day    Types: Cigarettes  . Smokeless tobacco: Not on file  . Alcohol Use: No    Review of Systems  All other systems reviewed and are negative.     Allergies  Dye fdc red  Home Medications   Prior to Admission medications    Medication Sig Start Date End Date Taking? Authorizing Provider  aspirin EC 81 MG tablet Take 81 mg by mouth daily.    Historical Provider, MD  cetirizine (ZYRTEC) 10 MG tablet Take 1 tablet at bedtime as needed for itching. 01/11/14   John Molpus, MD  clobetasol cream (TEMOVATE) 0.05 % Apply to affected areas of face twice daily. 01/11/14   John Molpus, MD  diclofenac (VOLTAREN) 50 MG EC tablet Take 50 mg by mouth 2 (two) times daily.    Historical Provider, MD  diphenhydrAMINE (BENADRYL) 50 MG tablet Take 50 mg by mouth at bedtime as needed for itching.    Historical Provider, MD  ibuprofen (ADVIL,MOTRIN) 200 MG tablet Take 200 mg by mouth every 6 (six) hours as needed for moderate pain.    Historical Provider, MD  metFORMIN (GLUCOPHAGE) 500 MG tablet Take 500 mg by mouth 2 (two) times daily with a meal.    Historical Provider, MD  PRESCRIPTION MEDICATION Take 1 tablet by mouth 2 (two) times daily. Medication that replaced trazodone. Pt. Unsure of name    Historical Provider, MD  primidone (MYSOLINE) 50 MG tablet Take 50 mg by mouth 3 (three) times daily.    Historical Provider, MD  risperiDONE (RISPERDAL) 2 MG  tablet Take 2 mg by mouth daily.    Historical Provider, MD   BP 123/69 mmHg  Pulse 62  Temp(Src) 98.2 F (36.8 C) (Oral)  Resp 16  Ht 5\' 8"  (1.727 m)  Wt 165 lb (74.844 kg)  BMI 25.09 kg/m2  SpO2 99% Physical Exam  Constitutional: He is oriented to person, place, and time. He appears well-developed and well-nourished.  HENT:  Head: Normocephalic and atraumatic.  Cardiovascular: Normal rate and regular rhythm.   No murmur heard. Pulmonary/Chest: Effort normal and breath sounds normal. No respiratory distress.  Abdominal: Soft. There is no tenderness. There is no rebound and no guarding.  Musculoskeletal: He exhibits no edema or tenderness.  Decreased ROM in the left shoulder secondary to pain, no overlying erythema or tenderness.  No reproducible tenderness to palpation over  shoulder or cspine.    Neurological: He is alert and oriented to person, place, and time.  5/5 grip strength in BUE.  Sensation to light touch intact in bilateral hands.    Skin: Skin is warm and dry.  Psychiatric: He has a normal mood and affect. His behavior is normal.  Nursing note and vitals reviewed.   ED Course  Procedures (including critical care time) Labs Review Labs Reviewed - No data to display  Imaging Review No results found.   EKG Interpretation None      MDM   Final diagnoses:  Cervical radiculopathy    Patient with history of chronic neck pain and numbness in his fingers here for evaluation of progressive pain that is now increased in the shoulder as well. History and exam is not consistent with pneumonia, ACS, dissection, PE. Pain is reproducible on examination with range of motion of the left upper extremity with a radicular component. This is an ongoing problem for the patient and he has prior imaging. He has no motor weakness in that arm. Recommend neurosurgery follow-up. Imaging has not been repeated at this time as he has multiple prior MRIs. Offered patient reassurance with discussions for importance of outpatient follow-up. Return precautions were discussed.    Troy Reichert, MD 03/03/15 1018

## 2015-03-03 NOTE — ED Notes (Signed)
Pt has had radiating left arm and neck pain for a month. He feels that the pain is getting worst, so he came to get it checked out.

## 2015-10-31 ENCOUNTER — Emergency Department (HOSPITAL_COMMUNITY): Payer: Medicare Other

## 2015-10-31 ENCOUNTER — Encounter (HOSPITAL_COMMUNITY): Payer: Self-pay | Admitting: *Deleted

## 2015-10-31 ENCOUNTER — Inpatient Hospital Stay (HOSPITAL_COMMUNITY)
Admission: EM | Admit: 2015-10-31 | Discharge: 2015-11-01 | DRG: 093 | Disposition: A | Discharge status: Home / Self Care | Payer: Medicare Other | Attending: Internal Medicine | Admitting: Internal Medicine

## 2015-10-31 ENCOUNTER — Inpatient Hospital Stay (HOSPITAL_COMMUNITY): Payer: Medicare Other

## 2015-10-31 DIAGNOSIS — E1165 Type 2 diabetes mellitus with hyperglycemia: Secondary | ICD-10-CM | POA: Diagnosis present

## 2015-10-31 DIAGNOSIS — Z7982 Long term (current) use of aspirin: Secondary | ICD-10-CM

## 2015-10-31 DIAGNOSIS — M542 Cervicalgia: Secondary | ICD-10-CM | POA: Diagnosis present

## 2015-10-31 DIAGNOSIS — F419 Anxiety disorder, unspecified: Secondary | ICD-10-CM | POA: Diagnosis present

## 2015-10-31 DIAGNOSIS — G8929 Other chronic pain: Secondary | ICD-10-CM | POA: Diagnosis present

## 2015-10-31 DIAGNOSIS — R768 Other specified abnormal immunological findings in serum: Secondary | ICD-10-CM | POA: Diagnosis present

## 2015-10-31 DIAGNOSIS — M6289 Other specified disorders of muscle: Secondary | ICD-10-CM

## 2015-10-31 DIAGNOSIS — R202 Paresthesia of skin: Secondary | ICD-10-CM | POA: Diagnosis not present

## 2015-10-31 DIAGNOSIS — G250 Essential tremor: Secondary | ICD-10-CM | POA: Diagnosis present

## 2015-10-31 DIAGNOSIS — Z79899 Other long term (current) drug therapy: Secondary | ICD-10-CM

## 2015-10-31 DIAGNOSIS — F329 Major depressive disorder, single episode, unspecified: Secondary | ICD-10-CM | POA: Diagnosis present

## 2015-10-31 DIAGNOSIS — R197 Diarrhea, unspecified: Secondary | ICD-10-CM | POA: Diagnosis present

## 2015-10-31 DIAGNOSIS — R42 Dizziness and giddiness: Secondary | ICD-10-CM | POA: Diagnosis present

## 2015-10-31 DIAGNOSIS — R531 Weakness: Secondary | ICD-10-CM | POA: Diagnosis present

## 2015-10-31 DIAGNOSIS — Z9049 Acquired absence of other specified parts of digestive tract: Secondary | ICD-10-CM

## 2015-10-31 DIAGNOSIS — Z91041 Radiographic dye allergy status: Secondary | ICD-10-CM

## 2015-10-31 DIAGNOSIS — R29898 Other symptoms and signs involving the musculoskeletal system: Secondary | ICD-10-CM | POA: Diagnosis not present

## 2015-10-31 DIAGNOSIS — G459 Transient cerebral ischemic attack, unspecified: Secondary | ICD-10-CM | POA: Diagnosis present

## 2015-10-31 DIAGNOSIS — E785 Hyperlipidemia, unspecified: Secondary | ICD-10-CM | POA: Diagnosis present

## 2015-10-31 DIAGNOSIS — E782 Mixed hyperlipidemia: Secondary | ICD-10-CM | POA: Diagnosis present

## 2015-10-31 DIAGNOSIS — Z7984 Long term (current) use of oral hypoglycemic drugs: Secondary | ICD-10-CM | POA: Diagnosis not present

## 2015-10-31 DIAGNOSIS — E1159 Type 2 diabetes mellitus with other circulatory complications: Secondary | ICD-10-CM | POA: Diagnosis not present

## 2015-10-31 DIAGNOSIS — Z89021 Acquired absence of right finger(s): Secondary | ICD-10-CM

## 2015-10-31 DIAGNOSIS — Z79891 Long term (current) use of opiate analgesic: Secondary | ICD-10-CM | POA: Diagnosis not present

## 2015-10-31 DIAGNOSIS — F418 Other specified anxiety disorders: Secondary | ICD-10-CM | POA: Diagnosis present

## 2015-10-31 DIAGNOSIS — I5189 Other ill-defined heart diseases: Secondary | ICD-10-CM | POA: Diagnosis present

## 2015-10-31 DIAGNOSIS — F32A Depression, unspecified: Secondary | ICD-10-CM | POA: Diagnosis present

## 2015-10-31 DIAGNOSIS — G20 Parkinson's disease: Secondary | ICD-10-CM | POA: Diagnosis present

## 2015-10-31 DIAGNOSIS — F17210 Nicotine dependence, cigarettes, uncomplicated: Secondary | ICD-10-CM | POA: Diagnosis present

## 2015-10-31 DIAGNOSIS — R209 Unspecified disturbances of skin sensation: Secondary | ICD-10-CM | POA: Diagnosis not present

## 2015-10-31 DIAGNOSIS — E1169 Type 2 diabetes mellitus with other specified complication: Secondary | ICD-10-CM

## 2015-10-31 DIAGNOSIS — E119 Type 2 diabetes mellitus without complications: Secondary | ICD-10-CM

## 2015-10-31 DIAGNOSIS — R200 Anesthesia of skin: Secondary | ICD-10-CM | POA: Diagnosis present

## 2015-10-31 DIAGNOSIS — Z720 Tobacco use: Secondary | ICD-10-CM | POA: Diagnosis present

## 2015-10-31 DIAGNOSIS — G451 Carotid artery syndrome (hemispheric): Secondary | ICD-10-CM | POA: Diagnosis not present

## 2015-10-31 LAB — I-STAT CHEM 8, ED
BUN: 5 mg/dL — AB (ref 6–20)
CREATININE: 0.7 mg/dL (ref 0.61–1.24)
Calcium, Ion: 1.2 mmol/L (ref 1.13–1.30)
Chloride: 99 mmol/L — ABNORMAL LOW (ref 101–111)
GLUCOSE: 254 mg/dL — AB (ref 65–99)
HEMATOCRIT: 48 % (ref 39.0–52.0)
Hemoglobin: 16.3 g/dL (ref 13.0–17.0)
Potassium: 3.8 mmol/L (ref 3.5–5.1)
Sodium: 140 mmol/L (ref 135–145)
TCO2: 24 mmol/L (ref 0–100)

## 2015-10-31 LAB — PHOSPHORUS: Phosphorus: 3.9 mg/dL (ref 2.5–4.6)

## 2015-10-31 LAB — COMPREHENSIVE METABOLIC PANEL
ALBUMIN: 4.3 g/dL (ref 3.5–5.0)
ALK PHOS: 56 U/L (ref 38–126)
ALT: 14 U/L — AB (ref 17–63)
AST: 19 U/L (ref 15–41)
Anion gap: 9 (ref 5–15)
BUN: 5 mg/dL — AB (ref 6–20)
CO2: 25 mmol/L (ref 22–32)
CREATININE: 0.92 mg/dL (ref 0.61–1.24)
Calcium: 9.8 mg/dL (ref 8.9–10.3)
Chloride: 103 mmol/L (ref 101–111)
GFR calc Af Amer: >60 mL/min (ref >60)
GFR calc non Af Amer: >60 mL/min (ref >60)
Glucose, Bld: 258 mg/dL — ABNORMAL HIGH (ref 65–99)
Potassium: 4 mmol/L (ref 3.5–5.1)
SODIUM: 137 mmol/L (ref 135–145)
Total Bilirubin: 0.9 mg/dL (ref 0.3–1.2)
Total Protein: 7 g/dL (ref 6.5–8.1)

## 2015-10-31 LAB — RAPID URINE DRUG SCREEN, HOSP PERFORMED
Amphetamines: NOT DETECTED
Barbiturates: NOT DETECTED
Benzodiazepines: NOT DETECTED
Cocaine: NOT DETECTED
OPIATES: POSITIVE — AB
TETRAHYDROCANNABINOL: NOT DETECTED

## 2015-10-31 LAB — DIFFERENTIAL
BASOS ABS: 0 10*3/uL (ref 0.0–0.1)
Basophils Relative: 0 %
Eosinophils Absolute: 0.1 10*3/uL (ref 0.0–0.7)
Eosinophils Relative: 1 %
LYMPHS PCT: 34 %
Lymphs Abs: 2.3 10*3/uL (ref 0.7–4.0)
MONOS PCT: 7 %
Monocytes Absolute: 0.5 10*3/uL (ref 0.1–1.0)
Neutro Abs: 3.8 10*3/uL (ref 1.7–7.7)
Neutrophils Relative %: 58 %

## 2015-10-31 LAB — APTT: APTT: 25 s (ref 24–37)

## 2015-10-31 LAB — ETHANOL: Alcohol, Ethyl (B): <5 mg/dL (ref <5)

## 2015-10-31 LAB — I-STAT TROPONIN, ED: Troponin i, poc: 0.01 ng/mL (ref 0.00–0.08)

## 2015-10-31 LAB — LIPID PANEL
Cholesterol: 188 mg/dL (ref 0–200)
HDL: 45 mg/dL (ref >40)
LDL CALC: 111 mg/dL — AB (ref 0–99)
TRIGLYCERIDES: 160 mg/dL — AB (ref <150)
Total CHOL/HDL Ratio: 4.2 RATIO
VLDL: 32 mg/dL (ref 0–40)

## 2015-10-31 LAB — URINALYSIS, ROUTINE W REFLEX MICROSCOPIC
Bilirubin Urine: NEGATIVE
Glucose, UA: 500 mg/dL — AB
Ketones, ur: NEGATIVE mg/dL
Leukocytes, UA: NEGATIVE
Nitrite: NEGATIVE
PH: 6 (ref 5.0–8.0)
Protein, ur: NEGATIVE mg/dL
Specific Gravity, Urine: 1.005 (ref 1.005–1.030)

## 2015-10-31 LAB — CBC
HEMATOCRIT: 42.7 % (ref 39.0–52.0)
HEMOGLOBIN: 14.8 g/dL (ref 13.0–17.0)
MCH: 25.9 pg — ABNORMAL LOW (ref 26.0–34.0)
MCHC: 34.7 g/dL (ref 30.0–36.0)
MCV: 74.8 fL — ABNORMAL LOW (ref 78.0–100.0)
Platelets: 198 10*3/uL (ref 150–400)
RBC: 5.71 MIL/uL (ref 4.22–5.81)
RDW: 13.8 % (ref 11.5–15.5)
WBC: 6.7 10*3/uL (ref 4.0–10.5)

## 2015-10-31 LAB — TSH: TSH: 1.247 u[IU]/mL (ref 0.350–4.500)

## 2015-10-31 LAB — GLUCOSE, CAPILLARY: GLUCOSE-CAPILLARY: 229 mg/dL — AB (ref 65–99)

## 2015-10-31 LAB — URINE MICROSCOPIC-ADD ON
Bacteria, UA: NONE SEEN
WBC UA: NONE SEEN WBC/hpf (ref 0–5)

## 2015-10-31 LAB — MAGNESIUM: Magnesium: 1.8 mg/dL (ref 1.7–2.4)

## 2015-10-31 LAB — CBG MONITORING, ED: Glucose-Capillary: 226 mg/dL — ABNORMAL HIGH (ref 65–99)

## 2015-10-31 LAB — PROTIME-INR
INR: 1.04 (ref 0.00–1.49)
Prothrombin Time: 13.8 seconds (ref 11.6–15.2)

## 2015-10-31 LAB — VITAMIN B12: Vitamin B-12: 312 pg/mL (ref 180–914)

## 2015-10-31 MED ORDER — ASPIRIN 325 MG PO TABS
325.0000 mg | ORAL_TABLET | Freq: Every day | ORAL | Status: DC
  Administered 2015-10-31 – 2015-11-01 (×2): 325 mg via ORAL
  Filled 2015-10-31: qty 1

## 2015-10-31 MED ORDER — STROKE: EARLY STAGES OF RECOVERY BOOK
Freq: Once | Status: AC
  Administered 2015-10-31: 23:00:00
  Filled 2015-10-31: qty 1

## 2015-10-31 MED ORDER — SODIUM CHLORIDE 0.9 % IV BOLUS (SEPSIS)
1000.0000 mL | Freq: Once | INTRAVENOUS | Status: AC
  Administered 2015-10-31: 1000 mL via INTRAVENOUS

## 2015-10-31 MED ORDER — ACETAMINOPHEN 325 MG PO TABS: 650.0000 mg | ORAL_TABLET | ORAL | Status: DC | PRN

## 2015-10-31 MED ORDER — ONDANSETRON HCL 4 MG/2ML IJ SOLN: 4.0000 mg | Freq: Four times a day (QID) | INTRAMUSCULAR | Status: DC | PRN

## 2015-10-31 MED ORDER — INSULIN ASPART 100 UNIT/ML ~~LOC~~ SOLN: 0.0000 [IU] | Freq: Three times a day (TID) | SUBCUTANEOUS | Status: DC

## 2015-10-31 MED ORDER — INFLUENZA VAC SPLIT QUAD 0.5 ML IM SUSY: 0.5000 mL | PREFILLED_SYRINGE | INTRAMUSCULAR | Status: DC | PRN

## 2015-10-31 MED ORDER — ENOXAPARIN SODIUM 40 MG/0.4ML ~~LOC~~ SOLN
40.0000 mg | SUBCUTANEOUS | Status: DC
  Administered 2015-10-31: 40 mg via SUBCUTANEOUS
  Filled 2015-10-31: qty 0.4

## 2015-10-31 MED ORDER — ACETAMINOPHEN 650 MG RE SUPP: 650.0000 mg | RECTAL | Status: DC | PRN

## 2015-10-31 MED ORDER — PRIMIDONE 50 MG PO TABS
50.0000 mg | ORAL_TABLET | Freq: Two times a day (BID) | ORAL | Status: DC
  Administered 2015-10-31 – 2015-11-01 (×2): 50 mg via ORAL
  Filled 2015-10-31 (×3): qty 1

## 2015-10-31 MED ORDER — HYDROCODONE-ACETAMINOPHEN 5-325 MG PO TABS: 1.0000 | ORAL_TABLET | Freq: Four times a day (QID) | ORAL | Status: DC | PRN

## 2015-10-31 MED ORDER — CLOBETASOL PROPIONATE 0.05 % EX CREA
TOPICAL_CREAM | Freq: Two times a day (BID) | CUTANEOUS | Status: DC
  Administered 2015-11-01: 10:00:00 via TOPICAL
  Filled 2015-10-31: qty 15

## 2015-10-31 NOTE — ED Notes (Signed)
Attempted report x1. 

## 2015-10-31 NOTE — ED Notes (Signed)
Pt returned from MRI °

## 2015-10-31 NOTE — ED Notes (Signed)
Pt states feeling R sided pressure to head, R leg "numbness/heaviness" x 15 minutes at 1230.  Pt thought it may be his blood sugar, so he drank a coke.  By time EMS arrived, cbg was in 300's.  Pt took metformin and glipizide before coming.  Neuro intact.

## 2015-10-31 NOTE — Consult Note (Signed)
Stroke Consult Consulting Physician: left sided weakness and sensory deficits  Chief Complaint: right-sided weakness  HPI: Troy Little is an 65 y.o. male hx of DM, psych disorders presenting with 3 transient episodes of dizziness and right leg weakness and paresthesias. Describes dizziness as sensation of being off balance. Also notes a heavy sensation in his RLE and a paresthesia type sensation of the right side of his tongue. Episodes lasted around 1-5 minutes each. He is currently asymptomatic. Denies any prior TIA/CVA history. Takes daily ASA 81mg .   Date last known well: 2/10 Time last known well: 67 tPA Given: No: asymptomatic Modified Rankin: Rankin Score=0  Past Medical History  Diagnosis Date  . MDD (major depressive disorder) ([place])   . Anxiety   . Agoraphobia   . Psychosis   . Diabetes mellitus without complication ([place])   . Parkinson's disease [facility])     Past Surgical History  Procedure Laterality Date  . Cholecystectomy      No family history on file. Social History:  reports that he has been smoking Cigarettes.  He has been smoking about 0.50 packs per day. He does not have any smokeless tobacco history on file. He reports that he does not drink alcohol or use illicit drugs.  Allergies:  Allergies  Allergen Reactions  . Dye Fdc Red [Red Dye]      (Not in a hospital admission)  ROS: Out of a complete 14 system review, the patient complains of only the following symptoms, and all other reviewed systems are negative. +weakness, paresthesias   Physical Examination: Filed Vitals:   10/31/15 1440  BP: 139/76  Pulse: 57  Temp: 98 F (36.7 C)  Resp: 18   Physical Exam  Constitutional: He appears well-developed and well-nourished.  Psych: Affect appropriate to situation Eyes: No scleral injection HENT: No OP obstrucion Head: Normocephalic.  Cardiovascular: Normal rate and regular rhythm.  Respiratory: Effort normal and breath sounds normal.   GI: Soft. Bowel sounds are normal. No distension. There is no tenderness.  Skin: WDI   Neurologic Examination: Mental Status: Alert, oriented, thought content appropriate.  Speech fluent without evidence of aphasia. No dysarthria. Able to follow 3 step commands without difficulty. Cranial Nerves: II: optic discs not visualized, visual fields grossly normal, pupils equal, round, reactive to light and accommodation III,IV, VI: ptosis not present, extra-ocular motions intact bilaterally V,VII: smile symmetric, facial light touch sensation normal bilaterally VIII: hearing normal bilaterally IX,X: gag reflex present XI: trapezius strength/neck flexion strength normal bilaterally XII: tongue strength normal  Motor: Right : Upper extremity    Left:     Upper extremity 5/5 deltoid       5/5 deltoid 5/5 biceps      5/5 biceps  5/5 triceps      5/5 triceps 5/5 hand grip      5/5 hand grip  Lower extremity     Lower extremity 5/5 hip flexor      5/5 hip flexor 5/5 quadricep      5/5 quadriceps  5/5 hamstrings     5/5 hamstrings 5/5 plantar flexion       5/5 plantar flexion 5/5 plantar extension     5/5 plantar extension Tone and bulk:normal tone throughout; no atrophy noted Sensory: Pinprick and light touch intact throughout, bilaterally Deep Tendon Reflexes: 2+ and symmetric throughout Plantars: Right: downgoing   Left: downgoing Cerebellar: normal finger-to-nose, and normal heel-to-shin test Gait: deferred  Laboratory Studies:   Basic Metabolic Panel:  Recent Labs  Lab 10/31/15 1509  NA 140  K 3.8  CL 99*  GLUCOSE 254*  BUN 5*  CREATININE 0.70    Liver Function Tests: No results for input(s): AST, ALT, ALKPHOS, BILITOT, PROT, ALBUMIN in the last 168 hours. No results for input(s): LIPASE, AMYLASE in the last 168 hours. No results for input(s): AMMONIA in the last 168 hours.  CBC:  Recent Labs Lab 10/31/15 1455 10/31/15 1509  WBC 6.7  --   NEUTROABS PENDING  --    HGB 14.8 16.3  HCT 42.7 48.0  MCV 74.8*  --   PLT 198  --     Cardiac Enzymes: No results for input(s): CKTOTAL, CKMB, CKMBINDEX, TROPONINI in the last 168 hours.  BNP: Invalid input(s): POCBNP  CBG:  Recent Labs Lab 10/31/15 1434  GLUCAP 53*    Microbiology: Results for orders placed or performed during the hospital encounter of 02/11/10  Urine culture     Status: None   Collection Time: 02/11/10 12:34 PM  Result Value Ref Range Status   Specimen Description URINE, RANDOM  Final   Special Requests NONE  Final   Colony Count NO GROWTH  Final   Culture NO GROWTH  Final   Report Status 02/12/2010 FINAL  Final    Coagulation Studies:  Recent Labs  10/31/15 1455  LABPROT 13.8  INR 1.04    Urinalysis: No results for input(s): COLORURINE, LABSPEC, PHURINE, GLUCOSEU, HGBUR, BILIRUBINUR, KETONESUR, PROTEINUR, UROBILINOGEN, NITRITE, LEUKOCYTESUR in the last 168 hours.  Invalid input(s): APPERANCEUR  Lipid Panel:  No results found for: CHOL, TRIG, HDL, CHOLHDL, VLDL, LDLCALC  HgbA1C: No results found for: HGBA1C  Urine Drug Screen:     Component Value Date/Time   LABOPIA NONE DETECTED 11/23/2012 1124   COCAINSCRNUR POSITIVE* 11/23/2012 1124   LABBENZ NONE DETECTED 11/23/2012 1124   AMPHETMU NONE DETECTED 11/23/2012 1124   THCU NONE DETECTED 11/23/2012 1124   LABBARB NONE DETECTED 11/23/2012 1124    Alcohol Level: No results for input(s): ETH in the last 168 hours.  Other results:  Imaging: No results found.  Assessment: 65 y.o. male hx of DM, psychiatric disorder presenting with recurrent transient episodes of right-sided sensory and motor deficits. Currently asymptomatic. Based on history cannot rule out TIA/CVA. Will be admitted for further workup once brain imaging completed.    Plan: 1. HgbA1c, fasting lipid panel 2. MRI, MRA  of the brain without contrast 3. PT consult, OT consult, Speech consult 4. Echocardiogram 5. Carotid dopplers 6.  Prophylactic therapy-ASA 325mg  daily 7. Risk factor modification 8. Telemetry monitoring 9. Frequent neuro checks 10. NPO until RN stroke swallow screen    Jim Like, DO [facility] [phone]  If 7pm- 7am, please page neurology on call as listed in [place]. 10/31/2015, 3:28 PM

## 2015-10-31 NOTE — ED Notes (Signed)
Per ems- c/o dizziness, intermittent with abnormal feeling in his right leg that is intermittent. H/a to right side of head. Is ambulatory. CBG 300. BP 140/60, HR 70.

## 2015-10-31 NOTE — H&P (Signed)
Date: 10/31/2015               Patient Name:  Troy Little MRN: [id number]  DOB: 06/26/1951 Age / Sex: 65 y.o., male   PCP: Provider Not In System         Medical Service: Internal Medicine Teaching Service         Attending Physician: Dr. Thayer Headings, MD    First Contact: Dr. Marijean Bravo Pager: [phone]  Second Contact: Dr. Marvel Plan Pager: [phone]       After Hours (After 5p/  First Contact Pager: [phone]  weekends / holidays): Second Contact Pager: [phone]   Chief Complaint: Dizziness  History of Present Illness: 65 y/o man with PMHx of diabetes, chronic neck pain, anxiety/depression, tremor presents after 3 unprovoked episodes of dizziness this afternoon starting around 1230. These were not associated with any change in activity and each resolved within 1-5 minutes after onset. Dizziness was accompanied with numbness on the right side of his face and a sensation of his right leg becoming very heavy. He also felt shortness of breath without any nausea, chest pain, or diaphoresis. He denies any heart palpitations or symptoms before or after the episodes. These were all located in the same distribution. He has never had similar symptoms in the past.  After arrival to the ED his symptoms occurred about 3 more times last when having his MRI. Imaging was negative for any stroke and EKG unchanged from previous tracings. No significant lab abnormalities besides some hyperglycemia. Admitted to hospital as suspected TIA events needing workup.  He continues to smoke daily approximately 1/2 ppd. He has been experiencing diarrhea intermittently for 2-3 months that is being worked up with his PCP. He reports discontinuing some of his home meds such as lisinopril recently but states he is still taking aspirin daily.  Meds: Current Facility-Administered Medications  Medication Dose Route Frequency Provider Last Rate Last Dose  .  stroke: mapping our early stages of recovery book   Does not apply  Once Juluis Mire, MD      . acetaminophen (TYLENOL) tablet 650 mg  650 mg Oral Q4H PRN Juluis Mire, MD       Or  . acetaminophen (TYLENOL) suppository 650 mg  650 mg Rectal Q4H PRN Marjan Rabbani, MD      . aspirin tablet 325 mg  325 mg Oral Daily Marjan Rabbani, MD      . clobetasol cream (TEMOVATE) 0.05 %   Topical BID Marjan Rabbani, MD      . enoxaparin (LOVENOX) injection 40 mg  40 mg Subcutaneous Q24H Marjan Rabbani, MD      . HYDROcodone-acetaminophen (NORCO/VICODIN) 5-325 MG per tablet 1 tablet  1 tablet Oral Q6H PRN Collier Salina, MD      . Influenza vac split quadrivalent PF (FLUARIX) injection 0.5 mL  0.5 mL Intramuscular Prior to discharge Juluis Mire, MD      . Derrill Memo ON 11/01/2015] insulin aspart (novoLOG) injection 0-15 Units  0-15 Units Subcutaneous TID WC Marjan Rabbani, MD      . ondansetron (ZOFRAN) injection 4 mg  4 mg Intravenous Q6H PRN Marjan Rabbani, MD      . primidone (MYSOLINE) tablet 50 mg  50 mg Oral BID Juluis Mire, MD       Current Outpatient Prescriptions  Medication Sig Dispense Refill  . aspirin EC 81 MG tablet Take 81 mg by mouth daily.    . cetirizine (ZYRTEC) 10 MG tablet Take 1  tablet at bedtime as needed for itching.    . clobetasol cream (TEMOVATE) 0.05 % Apply to affected areas of face twice daily. 30 g 0  . diphenhydrAMINE (BENADRYL) 50 MG tablet Take 50 mg by mouth at bedtime as needed for itching.    Marland Kitchen HYDROcodone-acetaminophen (NORCO/VICODIN) 5-325 MG tablet Take 1 tablet by mouth every 6 (six) hours as needed for moderate pain.    . hydrOXYzine (ATARAX/VISTARIL) 25 MG tablet Take 25 mg by mouth 3 (three) times daily as needed for vomiting.    . metFORMIN (GLUCOPHAGE) 500 MG tablet Take 500 mg by mouth 2 (two) times daily with a meal.    . primidone (MYSOLINE) 50 MG tablet Take 50 mg by mouth 2 (two) times daily.     Marland Kitchen oxyCODONE-acetaminophen (PERCOCET/ROXICET) 5-325 MG per tablet Take 1 tablet by mouth every 6 (six) hours as needed  for severe pain. (Patient not taking: Reported on 10/31/2015) 15 tablet 0    Allergies: Allergies as of 10/31/2015 - Review Complete 10/31/2015  Allergen Reaction Noted  . Dye fdc red [red dye]  01/11/2014   Past Medical History  Diagnosis Date  . MDD (major depressive disorder) ([place])   . Anxiety   . Agoraphobia   . Psychosis   . Diabetes mellitus without complication ([place])   . Parkinson's disease [facility])    Past Surgical History  Procedure Laterality Date  . Cholecystectomy     No family history on file. Social History   Social History  . Marital Status: Married    Spouse Name: N/A  . Number of Children: N/A  . Years of Education: N/A   Occupational History  . Not on file.   Social History Main Topics  . Smoking status: Current Every Day Smoker -- 0.50 packs/day    Types: Cigarettes  . Smokeless tobacco: Not on file  . Alcohol Use: No  . Drug Use: No  . Sexual Activity: Not on file   Other Topics Concern  . Not on file   Social History Narrative    Review of Systems: Review of Systems  Constitutional: Negative for fever and chills.  Eyes: Negative for blurred vision.  Respiratory: Positive for shortness of breath.   Cardiovascular: Negative for chest pain and palpitations.  Gastrointestinal: Positive for diarrhea. Negative for nausea and abdominal pain.  Genitourinary: Negative for dysuria.  Musculoskeletal: Positive for neck pain. Negative for falls.  Skin: Negative for rash.  Neurological: Positive for dizziness, tremors and sensory change. Negative for headaches.  Endo/Heme/Allergies: Does not bruise/bleed easily.  Psychiatric/Behavioral: Negative for substance abuse.    Physical Exam: Blood pressure 130/75, pulse 57, temperature 98 F (36.7 C), temperature source Oral, resp. rate 14, height 5\' 8"  (1.727 m), weight 75.751 kg (167 lb), SpO2 99 %. GENERAL- alert, co-operative, NAD HEENT- Atraumatic, PERRL, EOMI, oral mucosa appears moist, no carotid  bruit, no cervical LN enlargement. CARDIAC- RRR, no murmurs, rubs or gallops. RESP- CTAB, no wheezes or crackles. ABDOMEN- Soft, mild epigastric TTP NEURO- No obvious Cr N abnormality, strength upper and lower extremities- 5/5, Sensation intact globally, mild intention tremor EXTREMITIES- pulse 2+ b/l, symmetric, no pedal edema. SKIN- Warm, dry, No rash or lesion. PSYCH- Normal mood and affect, appropriate thought content and speech.   Lab results: Basic Metabolic Panel:  Recent Labs  10/31/15 1455 10/31/15 1509  NA 137 140  K 4.0 3.8  CL 103 99*  CO2 25  --   GLUCOSE 258* 254*  BUN 5* 5*  CREATININE 0.92 0.70  CALCIUM 9.8  --    Liver Function Tests:  Recent Labs  10/31/15 1455  AST 19  ALT 14*  ALKPHOS 56  BILITOT 0.9  PROT 7.0  ALBUMIN 4.3   No results for input(s): LIPASE, AMYLASE in the last 72 hours. No results for input(s): AMMONIA in the last 72 hours. CBC:  Recent Labs  10/31/15 1455 10/31/15 1509  WBC 6.7  --   NEUTROABS 3.8  --   HGB 14.8 16.3  HCT 42.7 48.0  MCV 74.8*  --   PLT 198  --    Cardiac Enzymes: No results for input(s): CKTOTAL, CKMB, CKMBINDEX, TROPONINI in the last 72 hours. BNP: No results for input(s): PROBNP in the last 72 hours. D-Dimer: No results for input(s): DDIMER in the last 72 hours. CBG:  Recent Labs  10/31/15 1434  GLUCAP 226*   Hemoglobin A1C: No results for input(s): HGBA1C in the last 72 hours. Fasting Lipid Panel: No results for input(s): CHOL, HDL, LDLCALC, TRIG, CHOLHDL, LDLDIRECT in the last 72 hours. Thyroid Function Tests: No results for input(s): TSH, T4TOTAL, FREET4, T3FREE, THYROIDAB in the last 72 hours. Anemia Panel: No results for input(s): VITAMINB12, FOLATE, FERRITIN, TIBC, IRON, RETICCTPCT in the last 72 hours. Coagulation:  Recent Labs  10/31/15 1455  LABPROT 13.8  INR 1.04   Urine Drug Screen: Drugs of Abuse     Component Value Date/Time   LABOPIA POSITIVE* 10/31/2015 1603    COCAINSCRNUR NONE DETECTED 10/31/2015 1603   LABBENZ NONE DETECTED 10/31/2015 1603   AMPHETMU NONE DETECTED 10/31/2015 1603   THCU NONE DETECTED 10/31/2015 1603   LABBARB NONE DETECTED 10/31/2015 1603    Alcohol Level:  Recent Labs  10/31/15 1455  ETH <5   Urinalysis:  Recent Labs  10/31/15 1603  COLORURINE COLORLESS*  LABSPEC 1.005  PHURINE 6.0  GLUCOSEU 500*  HGBUR SMALL*  BILIRUBINUR NEGATIVE  KETONESUR NEGATIVE  PROTEINUR NEGATIVE  NITRITE NEGATIVE  LEUKOCYTESUR NEGATIVE     Imaging results:  Mr Angiogram Head Wo Contrast  10/31/2015  CLINICAL DATA:  New onset intermittent dizziness and left lower extremity weakness. Heaviness in the right leg. Tingling in the right-sided the tongue. 3 episodes, each lasting 1-5 minutes. EXAM: MRI HEAD WITHOUT CONTRAST MRA HEAD WITHOUT CONTRAST TECHNIQUE: Multiplanar, multiecho pulse sequences of the brain and surrounding structures were obtained without intravenous contrast. Angiographic images of the head were obtained using MRA technique without contrast. COMPARISON:  None. FINDINGS: MRI HEAD FINDINGS The diffusion-weighted images demonstrate no evidence for acute or subacute infarction. No acute hemorrhage or mass lesion is present. There is no significant white matter disease. The ventricles are of normal size. No significant extraaxial fluid collection is present. The internal auditory canals are within normal limits bilaterally. Flow is present in the major intracranial arteries. The globes and orbits are intact. The paranasal sinuses are clear. There is minimal fluid in the mastoid air cells bilaterally. No obstructing nasopharyngeal lesion is evident. The skullbase is within normal limits. Midline sagittal images are unremarkable. MRA HEAD FINDINGS Item of the internal carotid arteries are within normal limits from the high cervical segments through the ICA termini. The A1 and M1 segments are normal. Anterior communicating artery is  patent. ACA and MCA branch vessels are within normal limits. The vertebral arteries are codominant. The right vertebral artery is slightly dominant the left. The PICA origins are not well seen. They are likely obscured by some motion artifact. The basilar artery is within  normal limits. Both posterior cerebral arteries originate from the basilar tip. The PCA branch vessels are within normal limits. IMPRESSION: 1. Normal MRI of the brain for age. 2. Normal MRA circle of Willis without significant proximal stenosis, aneurysm, or branch vessel occlusion. Electronically Signed   By: San Morelle M.D.   On: 10/31/2015 18:07   Mr Brain Wo Contrast  10/31/2015  CLINICAL DATA:  New onset intermittent dizziness and left lower extremity weakness. Heaviness in the right leg. Tingling in the right-sided the tongue. 3 episodes, each lasting 1-5 minutes. EXAM: MRI HEAD WITHOUT CONTRAST MRA HEAD WITHOUT CONTRAST TECHNIQUE: Multiplanar, multiecho pulse sequences of the brain and surrounding structures were obtained without intravenous contrast. Angiographic images of the head were obtained using MRA technique without contrast. COMPARISON:  None. FINDINGS: MRI HEAD FINDINGS The diffusion-weighted images demonstrate no evidence for acute or subacute infarction. No acute hemorrhage or mass lesion is present. There is no significant white matter disease. The ventricles are of normal size. No significant extraaxial fluid collection is present. The internal auditory canals are within normal limits bilaterally. Flow is present in the major intracranial arteries. The globes and orbits are intact. The paranasal sinuses are clear. There is minimal fluid in the mastoid air cells bilaterally. No obstructing nasopharyngeal lesion is evident. The skullbase is within normal limits. Midline sagittal images are unremarkable. MRA HEAD FINDINGS Item of the internal carotid arteries are within normal limits from the high cervical segments  through the ICA termini. The A1 and M1 segments are normal. Anterior communicating artery is patent. ACA and MCA branch vessels are within normal limits. The vertebral arteries are codominant. The right vertebral artery is slightly dominant the left. The PICA origins are not well seen. They are likely obscured by some motion artifact. The basilar artery is within normal limits. Both posterior cerebral arteries originate from the basilar tip. The PCA branch vessels are within normal limits. IMPRESSION: 1. Normal MRI of the brain for age. 2. Normal MRA circle of Willis without significant proximal stenosis, aneurysm, or branch vessel occlusion. Electronically Signed   By: San Morelle M.D.   On: 10/31/2015 18:07    Other results: EKG: Normal sinus rhythm, questionable T wave abnormality or axis deviation, unchanged from 2014 tracings in EMR  Assessment & Plan by Problem: TIA Patient meets moderate risk stratification by ABCD2 score 4. Main risk factors are age, diabetes, smoking. Has appointment with Cardiologist outpt on Monday. He is on aspirin at home has had some GI upset in the past but reports recent compliance. Not on cholesterol med by history and discussion. MRI/MRA negative. Bedside swallow eval okay. -Admit for CVA workup to telemetry -Continue ASA 325mg  -Check Hgb A1c -Carotid US -TTE -PT/OT eval -Repeat AM EKG, labs  Diabetes mellitus Hyperglycemic on admission. Reports metformin and glipizide at home. Followed at [facility] as primary care provider. -SSI-S while inpt  Essential/familial tremor Stable -Primidone 50mg  BID  Chronic neck pain Does not have any history of numbness or other focal symptoms from this. Takes norco/percocet twice daily most days for pain and sleep. UDS positive for opiates as expected per PTA medications. -Continue home meds  Diet: Carb modified VTE ppx: [employer] enoxaparin FULL CODE  Dispo: Disposition is deferred at this time, awaiting  improvement of current medical problems. Anticipated discharge in approximately 1 day(s).   The patient does not have a current PCP (Provider Not In System) and does not know need an [facility] hospital follow-up appointment after discharge.  The patient does not know have transportation limitations that hinder transportation to clinic appointments.  Signed: Collier Salina, MD 10/31/2015, 9:07 PM

## 2015-10-31 NOTE — Progress Notes (Signed)
Patient received into 36m15 alert and oriented with symptoms resolved. On arrival, vitals checked were stable and patient educated on the floor routines. Placed on cardiac monitor and safety measures observed. Oriented to the room and call bell placed within reach. Will keep monitoring.

## 2015-10-31 NOTE — ED Notes (Signed)
Attempted report 

## 2015-10-31 NOTE — ED Notes (Signed)
MD Plunkett at the bedside  

## 2015-10-31 NOTE — ED Notes (Signed)
Pt asked for food and drink.  This EMT provided a [place] sandwich, applesauce and a diet Coke after getting permission from his RN.

## 2015-10-31 NOTE — ED Provider Notes (Signed)
CSN: [id number]     Arrival date & time 10/31/15  1419 History   First MD Initiated Contact with Patient 10/31/15 1419     Chief Complaint  Patient presents with  . Dizziness     (Consider location/radiation/quality/duration/timing/severity/associated sxs/prior Treatment) HPI Comments: Patient is a 65 year old male with a history of diabetes and psychiatric diagnoses presenting today with intermittent dizziness and left leg weakness. Patient describes the dizziness as feeling off balance like he may fall but denies any presyncopal symptoms. During the dizzy episodes he also has heaviness in the right leg and tingling of the right side of his tongue. He denies any visual changes and symptoms do not seem to be related to position. He has had 3 episodes since 12:30 today each lasting between 1-5 minutes and resolving spontaneously. The symptoms are not brought on by position change or head movement. Add symptoms like this before and denies any chest pain, shortness of breath, arm pain or weakness. He did check his blood sugar today and it was 300 prior to arrival and he took metformin and glipizide. He has no prior history of stroke and no family history significant for stroke except for his father in his 71s. He denies any recent medication changes. Currently he is symptom-free. No new neck pain but states for months he's had cervical problems studies following up with her doctor.  Patient is a 65 y.o. male presenting with dizziness. The history is provided by the patient.  Dizziness Associated symptoms: no nausea and no vomiting     Past Medical History  Diagnosis Date  . MDD (major depressive disorder) ([place])   . Anxiety   . Agoraphobia   . Psychosis   . Diabetes mellitus without complication ([place])   . Parkinson's disease [facility])    Past Surgical History  Procedure Laterality Date  . Cholecystectomy     No family history on file. Social History  Substance Use Topics  . Smoking status:  Current Every Day Smoker -- 0.50 packs/day    Types: Cigarettes  . Smokeless tobacco: None  . Alcohol Use: No    Review of Systems  Gastrointestinal: Negative for nausea and vomiting.  Neurological: Positive for dizziness.  All other systems reviewed and are negative.     Allergies  Dye fdc red  Home Medications   Prior to Admission medications   Medication Sig Start Date End Date Taking? Authorizing Provider  aspirin EC 81 MG tablet Take 81 mg by mouth daily.    Historical Provider, MD  cetirizine (ZYRTEC) 10 MG tablet Take 1 tablet at bedtime as needed for itching. 01/11/14   John Molpus, MD  clobetasol cream (TEMOVATE) 0.05 % Apply to affected areas of face twice daily. 01/11/14   John Molpus, MD  diphenhydrAMINE (BENADRYL) 50 MG tablet Take 50 mg by mouth at bedtime as needed for itching.    Historical Provider, MD  metFORMIN (GLUCOPHAGE) 500 MG tablet Take 500 mg by mouth 2 (two) times daily with a meal.    Historical Provider, MD  oxyCODONE-acetaminophen (PERCOCET/ROXICET) 5-325 MG per tablet Take 1 tablet by mouth every 6 (six) hours as needed for severe pain. 03/03/15   Quintella Reichert, MD  primidone (MYSOLINE) 50 MG tablet Take 50 mg by mouth 2 (two) times daily.     Historical Provider, MD  RANITIDINE HCL PO Take 1 tablet by mouth daily.    Historical Provider, MD  TRAZODONE HCL PO Take 1 tablet by mouth at bedtime and may repeat  dose one time if needed.    Historical Provider, MD  VENLAFAXINE HCL PO Take 2 tablets by mouth daily.    Historical Provider, MD   BP 139/76 mmHg  Pulse 57  Temp(Src) 98 F (36.7 C) (Oral)  Resp 18  Ht 5\' 8"  (1.727 m)  Wt 167 lb (75.751 kg)  BMI 25.40 kg/m2  SpO2 98% Physical Exam  Constitutional: He is oriented to person, place, and time. He appears well-developed and well-nourished. No distress.  HENT:  Head: Normocephalic and atraumatic.  Mouth/Throat: Oropharynx is clear and moist.  Eyes: Conjunctivae and EOM are normal. Pupils are  equal, round, and reactive to light.  Neck: Normal range of motion. Neck supple.  Cardiovascular: Normal rate, regular rhythm and intact distal pulses.   No murmur heard. Pulmonary/Chest: Effort normal and breath sounds normal. No respiratory distress. He has no wheezes. He has no rales.  Abdominal: Soft. He exhibits no distension. There is no tenderness. There is no rebound and no guarding.  Musculoskeletal: Normal range of motion. He exhibits no edema or tenderness.  Neurological: He is alert and oriented to person, place, and time.  No nystagmus. No visual field cuts. No pronator drift. 5 out of 5 strength in the upper and lower extremities. Normal sensation.  Skin: Skin is warm and dry. No rash noted. No erythema.  Psychiatric: He has a normal mood and affect. His behavior is normal.  Nursing note and vitals reviewed.   ED Course  Procedures (including critical care time) Labs Review Labs Reviewed  CBC - Abnormal; Notable for the following:    MCV 74.8 (*)    MCH 25.9 (*)    All other components within normal limits  I-STAT CHEM 8, ED - Abnormal; Notable for the following:    Chloride 99 (*)    BUN 5 (*)    Glucose, Bld 254 (*)    All other components within normal limits  CBG MONITORING, ED - Abnormal; Notable for the following:    Glucose-Capillary 226 (*)    All other components within normal limits  PROTIME-INR  APTT  DIFFERENTIAL  ETHANOL  COMPREHENSIVE METABOLIC PANEL  URINE RAPID DRUG SCREEN, HOSP PERFORMED  URINALYSIS, ROUTINE W REFLEX MICROSCOPIC (NOT AT [facility])  I-STAT TROPOININ, ED    Imaging Review No results found. I have personally reviewed and evaluated these images and lab results as part of my medical decision-making.   EKG Interpretation   Date/Time:  Friday October 31 2015 14:26:51 EST Ventricular Rate:  61 PR Interval:  141 QRS Duration: 88 QT Interval:  366 QTC Calculation: 369 R Axis:   85 Text Interpretation:  Sinus rhythm Borderline  right axis deviation  Borderline T wave abnormalities Baseline wander in lead(s) II III aVF V1  No significant change since last tracing Confirmed by Maryan Rued  MD,  Loree Fee ([id number]) on 10/31/2015 3:02:42 PM      MDM   Final diagnoses:  None    Patient is a 65 year old male presenting today with the abrupt onset of intermittent symptoms. The symptoms include vertiginous symptoms with right-sided leg heaviness and tongue tingling. They last between 1-5 minutes and resolved spontaneously. They're not related to change of position or head movement. Does not seem to be peripheral vertigo based on patient's symptoms also he is not nauseated or vomiting with these symptoms. He does have difficulty walking while he's having episodes. Risk factors include diabetes and blood sugar was elevated today at 300 but on repeat check here was  in the 200s. He denies any recent medication changes or infections. No recent injections were no pain. He does have ongoing issues with his neck but states that it's at its baseline. His exam no focal neurologic findings. Concern for possible TIA as a source of his symptoms.  Stroke order set initiated.  Spoke with Dr. Janann Colonel and he will see the pt.  MRI pending.  Concern for TIA and pt will most likely need admission for TIA work up unless something different on MRI. Pt checked out to Dr. Rogene Houston at [address], MD 10/31/15 [phone]

## 2015-10-31 NOTE — ED Notes (Signed)
Admitting MD at bedside.

## 2015-11-01 ENCOUNTER — Encounter (HOSPITAL_COMMUNITY): Payer: Self-pay | Admitting: Internal Medicine

## 2015-11-01 ENCOUNTER — Inpatient Hospital Stay (HOSPITAL_COMMUNITY): Payer: Medicare Other

## 2015-11-01 DIAGNOSIS — F17210 Nicotine dependence, cigarettes, uncomplicated: Secondary | ICD-10-CM

## 2015-11-01 DIAGNOSIS — E785 Hyperlipidemia, unspecified: Secondary | ICD-10-CM | POA: Diagnosis present

## 2015-11-01 DIAGNOSIS — G451 Carotid artery syndrome (hemispheric): Secondary | ICD-10-CM

## 2015-11-01 DIAGNOSIS — R42 Dizziness and giddiness: Secondary | ICD-10-CM

## 2015-11-01 DIAGNOSIS — E1159 Type 2 diabetes mellitus with other circulatory complications: Secondary | ICD-10-CM

## 2015-11-01 DIAGNOSIS — G250 Essential tremor: Secondary | ICD-10-CM

## 2015-11-01 DIAGNOSIS — E782 Mixed hyperlipidemia: Secondary | ICD-10-CM | POA: Diagnosis present

## 2015-11-01 DIAGNOSIS — Z7982 Long term (current) use of aspirin: Secondary | ICD-10-CM

## 2015-11-01 DIAGNOSIS — G459 Transient cerebral ischemic attack, unspecified: Secondary | ICD-10-CM

## 2015-11-01 DIAGNOSIS — G8929 Other chronic pain: Secondary | ICD-10-CM | POA: Diagnosis present

## 2015-11-01 DIAGNOSIS — Z720 Tobacco use: Secondary | ICD-10-CM

## 2015-11-01 DIAGNOSIS — M542 Cervicalgia: Secondary | ICD-10-CM

## 2015-11-01 DIAGNOSIS — R29898 Other symptoms and signs involving the musculoskeletal system: Secondary | ICD-10-CM

## 2015-11-01 DIAGNOSIS — R202 Paresthesia of skin: Secondary | ICD-10-CM

## 2015-11-01 DIAGNOSIS — I10 Essential (primary) hypertension: Secondary | ICD-10-CM

## 2015-11-01 DIAGNOSIS — E1165 Type 2 diabetes mellitus with hyperglycemia: Secondary | ICD-10-CM

## 2015-11-01 DIAGNOSIS — Z7984 Long term (current) use of oral hypoglycemic drugs: Secondary | ICD-10-CM

## 2015-11-01 LAB — HIV ANTIBODY (ROUTINE TESTING W REFLEX): HIV Screen 4th Generation wRfx: NONREACTIVE

## 2015-11-01 LAB — GLUCOSE, CAPILLARY
GLUCOSE-CAPILLARY: 257 mg/dL — AB (ref 65–99)
Glucose-Capillary: 230 mg/dL — ABNORMAL HIGH (ref 65–99)

## 2015-11-01 LAB — MRSA PCR SCREENING: MRSA by PCR: NEGATIVE

## 2015-11-01 MED ORDER — CLOPIDOGREL BISULFATE 75 MG PO TABS: 75.0000 mg | ORAL_TABLET | Freq: Every day | ORAL | Status: DC

## 2015-11-01 MED ORDER — ATORVASTATIN CALCIUM 40 MG PO TABS: 40.0000 mg | ORAL_TABLET | Freq: Every day | ORAL | Status: DC

## 2015-11-01 MED ORDER — ATORVASTATIN CALCIUM 40 MG PO TABS
40.0000 mg | ORAL_TABLET | Freq: Every day | ORAL | Status: DC
  Filled 2015-11-01: qty 1

## 2015-11-01 NOTE — Progress Notes (Signed)
OT Discharge Note  Patient Details Name: Troy Little MRN: [id number] DOB: 1950-11-13   Cancelled Treatment:    Reason Eval/Treat Not Completed: OT screened, no needs identified, will sign off  Vonita Moss   OTR/L Pager: [phone] Office: [phone] .  11/01/2015, 2:23 PM

## 2015-11-01 NOTE — Progress Notes (Signed)
  Echocardiogram 2D Echocardiogram has been performed.  Troy Little 11/01/2015, 8:38 AM

## 2015-11-01 NOTE — Progress Notes (Signed)
VASCULAR LAB PRELIMINARY  PRELIMINARY  PRELIMINARY  PRELIMINARY  Carotid duplex completed.    Preliminary report:  1-39% ICA plaquing.  Vertebral artery flow is antegrade.   Bradshaw Minihan, RVT 11/01/2015, 9:12 AM

## 2015-11-01 NOTE — Progress Notes (Signed)
Pt discharged home with family. IV removed by pt. Discharge instructions given. No patient questions at this time. Pt left with family member at 56. Declines staff assistance to car. Wendee Copp

## 2015-11-01 NOTE — Discharge Instructions (Signed)
[name], it was a pleasure taking care you in the hospital. Your episode was NOT a stroke. To further investigate what may have caused this, please talk with you doctor about arranging an outpatient EEG to measure your brain waves. This may help detect a possible predisposition to seizures. We do not have any evidence that you had a seizure at this time.   Otherwise, we have discontinued your aspirin. Please stop taking aspirin. Instead, you will take Plavix 75 mg once daily. You will also take atorvastatin (Lipitor) 40 mg once a day.   We have the results from a picture of your heart pending. If you do not hear from [place] in the next 24 hours, it means it was normal.   Please talk with your doctor about better diabetes control.   Have a great day!  - Dr. Marijean Bravo

## 2015-11-01 NOTE — Progress Notes (Signed)
STROKE TEAM PROGRESS NOTE   HISTORY OF PRESENT ILLNESS Troy Little is a 65 y.o. male hx of DM, psych disorders presenting with 3 transient episodes of dizziness and right leg weakness and paresthesias. Describes dizziness as sensation of being off balance. Also notes a heavy sensation in his RLE and a paresthesia type sensation of the right side of his tongue. Episodes lasted around 1-5 minutes each. He is currently asymptomatic. Denies any prior TIA/CVA history. Takes daily ASA 81mg .   Date last known well: 2/10 Time last known well: 1230 tPA Given: No: asymptomatic Modified Rankin: Rankin Score=0  SUBJECTIVE (INTERVAL HISTORY) No family members present initially. The patient reports that he had at least 3 episodes of recurrent weakness yesterday associated with dyspnea, a heavy feeling in his right leg, and a squeezing pressure in his head. His arm was not affected. He states that he has not used cocaine for at least 4 years. Unfortunately he does continue to smoke cigarettes. He also admits that his diabetes is poorly controlled. Stressed the importance of obtaining good glucose control and abstaining from tobacco.   OBJECTIVE Temp:  [97.5 F (36.4 C)-98.6 F (37 C)] 97.5 F (36.4 C) (02/11 0700) Pulse Rate:  [50-73] 51 (02/11 0700) Cardiac Rhythm:  [-] Sinus bradycardia (02/10 2156) Resp:  [12-18] 16 (02/11 0700) BP: (108-139)/(66-86) 138/76 mmHg (02/11 0700) SpO2:  [96 %-100 %] 100 % (02/11 0700) Weight:  [75.751 kg (167 lb)] 75.751 kg (167 lb) (02/10 1440)  CBC:  Recent Labs Lab 10/31/15 1455 10/31/15 1509  WBC 6.7  --   NEUTROABS 3.8  --   HGB 14.8 16.3  HCT 42.7 48.0  MCV 74.8*  --   PLT 198  --     Basic Metabolic Panel:  Recent Labs Lab 10/31/15 1455 10/31/15 1509 10/31/15 2138  NA 137 140  --   K 4.0 3.8  --   CL 103 99*  --   CO2 25  --   --   GLUCOSE 258* 254*  --   BUN 5* 5*  --   CREATININE 0.92 0.70  --   CALCIUM 9.8  --   --   MG  --   --   1.8  PHOS  --   --  3.9    Lipid Panel:    Component Value Date/Time   CHOL 188 10/31/2015 2138   TRIG 160* 10/31/2015 2138   HDL 45 10/31/2015 2138   CHOLHDL 4.2 10/31/2015 2138   VLDL 32 10/31/2015 2138   LDLCALC 111* 10/31/2015 2138   HgbA1c: No results found for: HGBA1C Urine Drug Screen:    Component Value Date/Time   LABOPIA POSITIVE* 10/31/2015 1603   COCAINSCRNUR NONE DETECTED 10/31/2015 1603   LABBENZ NONE DETECTED 10/31/2015 1603   AMPHETMU NONE DETECTED 10/31/2015 1603   THCU NONE DETECTED 10/31/2015 1603   LABBARB NONE DETECTED 10/31/2015 1603      IMAGING I have personally reviewed the radiological images below and agree with the radiology interpretations.  Dg Chest 2 View 10/31/2015   No active cardiopulmonary disease.   Mri and Mra Head Wo Contrast 10/31/2015   1. Normal MRI of the brain for age.  2. Normal MRA circle of Willis without significant proximal stenosis, aneurysm, or branch vessel occlusion.   CUS - Bilateral: 1-39% ICA stenosis. Vertebral artery flow is antegrade.  TTE - - Left ventricle: The cavity size was normal. Wall thickness was normal. Systolic function was normal. The estimated ejection fraction  was in the range of 55% to 60%. Wall motion was normal; there were no regional wall motion abnormalities. Doppler parameters are consistent with abnormal left ventricular relaxation (grade 1 diastolic dysfunction). Impressions: - Normal LV systolic function; grade 1 diastolic dysfunction; trace TR.  PHYSICAL EXAM  Temp:  [97.5 F (36.4 C)-98.6 F (37 C)] 97.9 F (36.6 C) (02/11 1001) Pulse Rate:  [50-73] 57 (02/11 1001) Resp:  [13-16] 16 (02/11 1001) BP: (114-139)/(66-86) 131/76 mmHg (02/11 1001) SpO2:  [96 %-100 %] 99 % (02/11 1001)  General - Well nourished, well developed, in no apparent distress.  Ophthalmologic - Sharp disc margins OU.  Cardiovascular - Regular rate and rhythm with no murmur.  Mental Status -   Level of arousal and orientation to time, place, and person were intact. Language including expression, naming, repetition, comprehension was assessed and found intact. Fund of Knowledge was assessed and was intact.  Cranial Nerves II - XII - II - Visual field intact OU. III, IV, VI - Extraocular movements intact. V - Facial sensation intact bilaterally. VII - Facial movement intact bilaterally. VIII - Hearing & vestibular intact bilaterally. X - Palate elevates symmetrically. XI - Chin turning & shoulder shrug intact bilaterally. XII - Tongue protrusion intact.  Motor Strength - The patient's strength was normal in all extremities and pronator drift was absent.  Bulk was normal and fasciculations were absent.   Motor Tone - Muscle tone was assessed at the neck and appendages and was normal.  Reflexes - The patient's reflexes were 1+ in all extremities and he had no pathological reflexes.  Sensory - Light touch, temperature/pinprick were assessed and were symmetrical.    Coordination - The patient had normal movements in the hands and feet with no ataxia or dysmetria.  Tremor was absent.  Gait and Station - The patient's transfers, posture, gait, station, and turns were observed as normal.   ASSESSMENT/PLAN [name] is a 65 y.o. male with history of anxiety, depression, agoraphobia, tobacco use, previous substance abuse, psychosis, diabetes mellitus, and essential tremor presenting with right lower extremity weakness, dyspnea, and headache. He did not receive IV t-PA due to resolution of deficits.  Possible TIA  Resultant resolution of deficits  MRI  normal  MRA  normal  Carotid Doppler  Unremarkable    2D Echo  EF 55-60%  LDL - 111  HgbA1c pending  VTE prophylaxis - Lovenox  Diet Carb Modified Fluid consistency:: Thin; Room service appropriate?: Yes  aspirin 81 mg daily prior to admission, now on aspirin 325 mg daily. Recommend to change to plavix for  stroke prevention.  Patient counseled to be compliant with his antithrombotic medications  Ongoing aggressive stroke risk factor management  Therapy recommendations:  No follow-up physical therapy recommended  Disposition:  Probable discharge today  Follow up with [facility]  Hypertension  Stable  Permissive hypertension (OK if < 220/120) but gradually normalize in 5-7 days  Hyperlipidemia  Home meds:  No lipid lowering medications prior to admission.  LDL 111, goal < 70  Now on Lipitor 40 mg daily  Continue statin at discharge  Diabetes  HgbA1c pending goal < 7.0  Uncontrolled  At home running 150 - 250  [facility]  Tobacco abuse  Current smoker  Smoking cessation counseling provided  Pt is willing to quit  Other Stroke Risk Factors  Advanced age  Hx of cocaine use  Hospital day # 1  Neurology will sign off. Please call  with questions. Pt will follow up with [facility] in about 1 month. Thanks for the consult.  Rosalin Hawking, MD PhD Stroke Neurology 11/01/2015 4:16 PM   To contact Stroke Continuity provider, please refer to [email or web address] After hours, contact General Neurology

## 2015-11-01 NOTE — Progress Notes (Addendum)
Subjective:  Patient denied any episodes of loss of sensation or dizziness overnight.  He regaled his event once more for me. He said he had been driving as normal and running errands for his wife during the morning. As he was making his wife coffee, he felt a sudden onset right sided facial numbness, feeling as though his right side was in a vice (although not resembling a headache), feeling as though his right foot weighed 1000 lbs, and right leg numbness. He also described a sense of dysequilibrium. He denied weakness, numbness, or paresthesias in his torso or arms. There were no antecedents to the episode, and it had never happened before. He said the episodes for only 2-3 minutes at a time, and he has had a total of 6 episodes. They occur whether he is sitting or standing. He denies any new medications. He denies any photophobia or nausea. He thinks his symptoms are related to his high sugars.   He reports losing his right middle finger in a saw accident when he was 65. He confirmed a history of "seeing things" and being hospitalized. He said he was previously diagnosed with Parkinson's in the early 2000's but seriously doubts this diagnosis because he'd "be dead by now."   Objective: Vital signs in last 24 hours: Filed Vitals:   11/01/15 0249 11/01/15 0505 11/01/15 0700 11/01/15 1001  BP: 121/74 124/86 138/76 131/76  Pulse: 52 50 51 57  Temp: 97.5 F (36.4 C) 97.5 F (36.4 C) 97.5 F (36.4 C) 97.9 F (36.6 C)  TempSrc: Oral Oral Oral Oral  Resp: 16 14 16 16   Height:      Weight:      SpO2: 98% 96% 100% 99%   Weight change:   Intake/Output Summary (Last 24 hours) at 11/01/15 1125 Last data filed at 10/31/15 1830  Gross per 24 hour  Intake   1000 ml  Output      0 ml  Net   1000 ml   Physical Exam: General: Lying in bed HEENT: No scleral icterus, PERRL, No carotid bruits, MMM, no tonsillar exudate or erythema Cardiovascular: RRR, no m/r/g Pulmonary: CTAB. Unlabored  breathing Abdominal: Soft NT/ND. Normal BS. Extremities: Right 3rd digit distal phalanx amputation. No clubbing, cyanosis, or edema Neurological: AAOx3, speech normal, shoulder shrug normal, tongue midline but tremulous, face symmetric, sensation to light tough in tact throughout, 5/5 strength in all extremities, FTN demonstrates intention tremor. No visual field cut, EOMI.  Psychiatric: Normal affect and behavior.   Lab Results: Basic Metabolic Panel:  Recent Labs Lab 10/31/15 1455 10/31/15 1509 10/31/15 2138  NA 137 140  --   K 4.0 3.8  --   CL 103 99*  --   CO2 25  --   --   GLUCOSE 258* 254*  --   BUN 5* 5*  --   CREATININE 0.92 0.70  --   CALCIUM 9.8  --   --   MG  --   --  1.8  PHOS  --   --  3.9   Liver Function Tests:  Recent Labs Lab 10/31/15 1455  AST 19  ALT 14*  ALKPHOS 56  BILITOT 0.9  PROT 7.0  ALBUMIN 4.3   CBC:  Recent Labs Lab 10/31/15 1455 10/31/15 1509  WBC 6.7  --   NEUTROABS 3.8  --   HGB 14.8 16.3  HCT 42.7 48.0  MCV 74.8*  --   PLT 198  --  Recent Labs Lab 10/31/15 1434 10/31/15 2211 11/01/15 0635  GLUCAP 226* 229* 230*   Fasting Lipid Panel:  Recent Labs Lab 10/31/15 2138  CHOL 188  HDL 45  LDLCALC 111*  TRIG 160*  CHOLHDL 4.2   Coagulation:  Recent Labs Lab 10/31/15 1455  LABPROT 13.8  INR 1.04   Anemia Panel:  Recent Labs Lab 10/31/15 2138  VITAMINB12 312   Urine Drug Screen: Drugs of Abuse     Component Value Date/Time   LABOPIA POSITIVE* 10/31/2015 1603   COCAINSCRNUR NONE DETECTED 10/31/2015 1603   LABBENZ NONE DETECTED 10/31/2015 1603   AMPHETMU NONE DETECTED 10/31/2015 1603   THCU NONE DETECTED 10/31/2015 1603   LABBARB NONE DETECTED 10/31/2015 1603    Alcohol Level:  Recent Labs Lab 10/31/15 1455  ETH <5   Urinalysis:  Recent Labs Lab 10/31/15 1603  COLORURINE COLORLESS*  LABSPEC 1.005  PHURINE 6.0  GLUCOSEU 500*  HGBUR SMALL*  BILIRUBINUR NEGATIVE  KETONESUR NEGATIVE    PROTEINUR NEGATIVE  NITRITE NEGATIVE  LEUKOCYTESUR NEGATIVE    Studies/Results: Dg Chest 2 View  10/31/2015  CLINICAL DATA:  65 year old male with dizziness, right leg weakness and paresthesia. EXAM: CHEST  2 VIEW COMPARISON:  04/05/2005 and prior radiographs FINDINGS: The cardiomediastinal silhouette is unremarkable. There is no evidence of focal airspace disease, pulmonary edema, suspicious pulmonary nodule/mass, pleural effusion, or pneumothorax. No acute bony abnormalities are identified. IMPRESSION: No active cardiopulmonary disease. Electronically Signed   By: Margarette Canada M.D.   On: 10/31/2015 21:15   Mr Angiogram Head Wo Contrast  10/31/2015  CLINICAL DATA:  New onset intermittent dizziness and left lower extremity weakness. Heaviness in the right leg. Tingling in the right-sided the tongue. 3 episodes, each lasting 1-5 minutes. EXAM: MRI HEAD WITHOUT CONTRAST MRA HEAD WITHOUT CONTRAST TECHNIQUE: Multiplanar, multiecho pulse sequences of the brain and surrounding structures were obtained without intravenous contrast. Angiographic images of the head were obtained using MRA technique without contrast. COMPARISON:  None. FINDINGS: MRI HEAD FINDINGS The diffusion-weighted images demonstrate no evidence for acute or subacute infarction. No acute hemorrhage or mass lesion is present. There is no significant white matter disease. The ventricles are of normal size. No significant extraaxial fluid collection is present. The internal auditory canals are within normal limits bilaterally. Flow is present in the major intracranial arteries. The globes and orbits are intact. The paranasal sinuses are clear. There is minimal fluid in the mastoid air cells bilaterally. No obstructing nasopharyngeal lesion is evident. The skullbase is within normal limits. Midline sagittal images are unremarkable. MRA HEAD FINDINGS Item of the internal carotid arteries are within normal limits from the high cervical segments  through the ICA termini. The A1 and M1 segments are normal. Anterior communicating artery is patent. ACA and MCA branch vessels are within normal limits. The vertebral arteries are codominant. The right vertebral artery is slightly dominant the left. The PICA origins are not well seen. They are likely obscured by some motion artifact. The basilar artery is within normal limits. Both posterior cerebral arteries originate from the basilar tip. The PCA branch vessels are within normal limits. IMPRESSION: 1. Normal MRI of the brain for age. 2. Normal MRA circle of Willis without significant proximal stenosis, aneurysm, or branch vessel occlusion. Electronically Signed   By: San Morelle M.D.   On: 10/31/2015 18:07   Mr Brain Wo Contrast  10/31/2015  CLINICAL DATA:  New onset intermittent dizziness and left lower extremity weakness. Heaviness in the right leg. Tingling  in the right-sided the tongue. 3 episodes, each lasting 1-5 minutes. EXAM: MRI HEAD WITHOUT CONTRAST MRA HEAD WITHOUT CONTRAST TECHNIQUE: Multiplanar, multiecho pulse sequences of the brain and surrounding structures were obtained without intravenous contrast. Angiographic images of the head were obtained using MRA technique without contrast. COMPARISON:  None. FINDINGS: MRI HEAD FINDINGS The diffusion-weighted images demonstrate no evidence for acute or subacute infarction. No acute hemorrhage or mass lesion is present. There is no significant white matter disease. The ventricles are of normal size. No significant extraaxial fluid collection is present. The internal auditory canals are within normal limits bilaterally. Flow is present in the major intracranial arteries. The globes and orbits are intact. The paranasal sinuses are clear. There is minimal fluid in the mastoid air cells bilaterally. No obstructing nasopharyngeal lesion is evident. The skullbase is within normal limits. Midline sagittal images are unremarkable. MRA HEAD FINDINGS  Item of the internal carotid arteries are within normal limits from the high cervical segments through the ICA termini. The A1 and M1 segments are normal. Anterior communicating artery is patent. ACA and MCA branch vessels are within normal limits. The vertebral arteries are codominant. The right vertebral artery is slightly dominant the left. The PICA origins are not well seen. They are likely obscured by some motion artifact. The basilar artery is within normal limits. Both posterior cerebral arteries originate from the basilar tip. The PCA branch vessels are within normal limits. IMPRESSION: 1. Normal MRI of the brain for age. 2. Normal MRA circle of Willis without significant proximal stenosis, aneurysm, or branch vessel occlusion. Electronically Signed   By: San Morelle M.D.   On: 10/31/2015 18:07   Medications: I have reviewed the patient's current medications. Scheduled Meds: . aspirin  325 mg Oral Daily  . atorvastatin  40 mg Oral q1800  . clobetasol cream   Topical BID  . enoxaparin (LOVENOX) injection  40 mg Subcutaneous Q24H  . insulin aspart  0-15 Units Subcutaneous TID WC  . primidone  50 mg Oral BID   Continuous Infusions:  PRN Meds:.acetaminophen **OR** acetaminophen, HYDROcodone-acetaminophen, Influenza vac split quadrivalent PF, ondansetron (ZOFRAN) IV Assessment/Plan:  Numbness, Dizziness, and Leg Weakness: Resolved. Unclear etiology since his presentation would require transient ischemia in the ACA, MCA, and posterior circulation simultaneously. Given the discordant presentation, negative MRI, and normal carotid dopplers, I have low suspicion for TIA at this time, although TTE results are still pending. Complex migraine is considered, but also seems unlikely given this short episodic nature of the HA, and the lack of photophobia and nausea.  It is possible polypharmacy may have played a role given that he is on primidone, diphenhydramine, Norco, and hydroxyzine at home,  although he has been on the medications for a long time.  - TTE pending  T2DM: CBGs in the 200s. Patient takes 500 mg BID and glipizide at home, although only occasional given he says he gets hypoglycemic at home. - SSI-M - A1c pending  Hyperlipidemia: LDL 111.  - Start atorvastatin 40 mg daily - ASA 325 mg daily, will likely decrease to 81 mg ASA on discharge  Essential Tremor: Noted on exam. No resting tremor. - Continue  Chronic Neck Pain: Continue Norco 5-325 q6h prn  Tobacco use disorder: Encouraged smoking cessation.  DVT PPx: Lovenox [place]  Dispo: Anticipated discharge today depending on neurology recommendations and if TTE is read  The patient does have a current PCP [place] and does not need an [facility] hospital follow-up appointment after discharge.  The patient does have transportation limitations that hinder transportation to clinic appointments.  .Services Needed at time of discharge: Y = Yes, Blank = No PT:   OT:   RN:   Equipment:   Other:     LOS: 1 day   Liberty Handy, MD 11/01/2015, 11:25 AM

## 2015-11-01 NOTE — Discharge Summary (Signed)
Name: Troy Little MRN: [id number] DOB: 1951/03/22 65 y.o. PCP: Provider Not In System  Date of Admission: 10/31/2015  2:19 PM Date of Discharge: 11/01/2015 Attending Physician: Thayer Headings, MD  Discharge Diagnosis: Principal Problems 1. Acute Paresthesias 2. Acute Right Leg Weakness 3. Acute Dysequilibrium Chronic Problems 4. T2DM 5. HLD 6. Essential Tremor 7. Chronic Neck Pain 8. Tobacco Use Disorder  Discharge Medications:   Medication List    STOP taking these medications        aspirin EC 81 MG tablet     oxyCODONE-acetaminophen 5-325 MG tablet  Commonly known as:  PERCOCET/ROXICET      TAKE these medications        atorvastatin 40 MG tablet  Commonly known as:  LIPITOR  Take 1 tablet (40 mg total) by mouth daily at 6 PM.     cetirizine 10 MG tablet  Commonly known as:  ZYRTEC  Take 1 tablet at bedtime as needed for itching.     clobetasol cream 0.05 %  Commonly known as:  TEMOVATE  Apply to affected areas of face twice daily.     clopidogrel 75 MG tablet  Commonly known as:  PLAVIX  Take 1 tablet (75 mg total) by mouth daily.  Start taking on:  11/02/2015     diphenhydrAMINE 50 MG tablet  Commonly known as:  BENADRYL  Take 50 mg by mouth at bedtime as needed for itching.     HYDROcodone-acetaminophen 5-325 MG tablet  Commonly known as:  NORCO/VICODIN  Take 1 tablet by mouth every 6 (six) hours as needed for moderate pain.     hydrOXYzine 25 MG tablet  Commonly known as:  ATARAX/VISTARIL  Take 25 mg by mouth 3 (three) times daily as needed for vomiting.     metFORMIN 500 MG tablet  Commonly known as:  GLUCOPHAGE  Take 500 mg by mouth 2 (two) times daily with a meal.     primidone 50 MG tablet  Commonly known as:  MYSOLINE  Take 50 mg by mouth 2 (two) times daily.        Disposition and follow-up:   Troy Little was discharged from [facility] in good condition.  At the hospital follow up visit please  address:  1.  Patient had an acute episode of right face and right leg weakness and paresthesias. This was associated with a sense of dysequilibrium. These have resolved and have never occurred before in this gentleman. His symptoms are not consistent with a single neurovascular territory and would have had to have had multiple sites of acute ischemia simultaneously. Echo and carotid dopplers were normal. Therefore I have low suspicion for TIA. Nonetheless, he will need an outpatient EEG query a possible partial seizure.  2. Polypharmacy may have contributed to the episode. The patient is on primidone, hydrocodone, hydroxyzine, and diphenhydramine at home. The patient indicates primidone is ineffective for his ET.   3.  Labs / imaging needed at time of follow-up: EEG  4.  Pending labs/ test needing follow-up: Hgb A1c  Follow-up Appointments:     Follow-up Information    Follow up with [facility]. Go on 11/03/2015.   Why:  Cardiology Follow-up.       Follow up with [facility]. Go on 11/14/2015.   Why:  For diabetes management      Discharge Instructions: Discharge Instructions    Diet - low sodium heart healthy  Complete by:  As directed      Increase activity slowly    Complete by:  As directed           Troy. Little, it was a pleasure taking care you in the hospital. Your episode was NOT a stroke. To further investigate what may have caused this, please talk with you doctor about arranging an outpatient EEG to measure your brain waves. This may help detect a possible predisposition to seizures. We do not have any evidence that you had a seizure at this time.   Otherwise, we have discontinued your aspirin. Please stop taking aspirin. Instead, you will take Plavix 75 mg once daily. You will also take atorvastatin (Lipitor) 40 mg once a day.   We have the results from a picture of your heart pending. If you do not hear  from [place] in the next 24 hours, it means it was normal.   Please talk with your doctor about better diabetes control.   Have a great day!   Consultations: Neurology  Procedures Performed:  Dg Chest 2 View  10/31/2015  CLINICAL DATA:  65 year old male with dizziness, right leg weakness and paresthesia. EXAM: CHEST  2 VIEW COMPARISON:  04/05/2005 and prior radiographs FINDINGS: The cardiomediastinal silhouette is unremarkable. There is no evidence of focal airspace disease, pulmonary edema, suspicious pulmonary nodule/mass, pleural effusion, or pneumothorax. No acute bony abnormalities are identified. IMPRESSION: No active cardiopulmonary disease. Electronically Signed   By: Margarette Canada M.D.   On: 10/31/2015 21:15   Troy Angiogram Head Wo Contrast  10/31/2015  CLINICAL DATA:  New onset intermittent dizziness and left lower extremity weakness. Heaviness in the right leg. Tingling in the right-sided the tongue. 3 episodes, each lasting 1-5 minutes. EXAM: MRI HEAD WITHOUT CONTRAST MRA HEAD WITHOUT CONTRAST TECHNIQUE: Multiplanar, multiecho pulse sequences of the brain and surrounding structures were obtained without intravenous contrast. Angiographic images of the head were obtained using MRA technique without contrast. COMPARISON:  None. FINDINGS: MRI HEAD FINDINGS The diffusion-weighted images demonstrate no evidence for acute or subacute infarction. No acute hemorrhage or mass lesion is present. There is no significant white matter disease. The ventricles are of normal size. No significant extraaxial fluid collection is present. The internal auditory canals are within normal limits bilaterally. Flow is present in the major intracranial arteries. The globes and orbits are intact. The paranasal sinuses are clear. There is minimal fluid in the mastoid air cells bilaterally. No obstructing nasopharyngeal lesion is evident. The skullbase is within normal limits. Midline sagittal images are unremarkable. MRA HEAD  FINDINGS Item of the internal carotid arteries are within normal limits from the high cervical segments through the ICA termini. The A1 and M1 segments are normal. Anterior communicating artery is patent. ACA and MCA branch vessels are within normal limits. The vertebral arteries are codominant. The right vertebral artery is slightly dominant the left. The PICA origins are not well seen. They are likely obscured by some motion artifact. The basilar artery is within normal limits. Both posterior cerebral arteries originate from the basilar tip. The PCA branch vessels are within normal limits. IMPRESSION: 1. Normal MRI of the brain for age. 2. Normal MRA circle of Willis without significant proximal stenosis, aneurysm, or branch vessel occlusion. Electronically Signed   By: San Morelle M.D.   On: 10/31/2015 18:07   Troy Brain Wo Contrast  10/31/2015  CLINICAL DATA:  New onset intermittent dizziness and left lower extremity weakness. Heaviness in the right leg. Tingling  in the right-sided the tongue. 3 episodes, each lasting 1-5 minutes. EXAM: MRI HEAD WITHOUT CONTRAST MRA HEAD WITHOUT CONTRAST TECHNIQUE: Multiplanar, multiecho pulse sequences of the brain and surrounding structures were obtained without intravenous contrast. Angiographic images of the head were obtained using MRA technique without contrast. COMPARISON:  None. FINDINGS: MRI HEAD FINDINGS The diffusion-weighted images demonstrate no evidence for acute or subacute infarction. No acute hemorrhage or mass lesion is present. There is no significant white matter disease. The ventricles are of normal size. No significant extraaxial fluid collection is present. The internal auditory canals are within normal limits bilaterally. Flow is present in the major intracranial arteries. The globes and orbits are intact. The paranasal sinuses are clear. There is minimal fluid in the mastoid air cells bilaterally. No obstructing nasopharyngeal lesion is  evident. The skullbase is within normal limits. Midline sagittal images are unremarkable. MRA HEAD FINDINGS Item of the internal carotid arteries are within normal limits from the high cervical segments through the ICA termini. The A1 and M1 segments are normal. Anterior communicating artery is patent. ACA and MCA branch vessels are within normal limits. The vertebral arteries are codominant. The right vertebral artery is slightly dominant the left. The PICA origins are not well seen. They are likely obscured by some motion artifact. The basilar artery is within normal limits. Both posterior cerebral arteries originate from the basilar tip. The PCA branch vessels are within normal limits. IMPRESSION: 1. Normal MRI of the brain for age. 2. Normal MRA circle of Willis without significant proximal stenosis, aneurysm, or branch vessel occlusion. Electronically Signed   By: San Morelle M.D.   On: 10/31/2015 18:07    2D Echo:  - Left ventricle: The cavity size was normal. Wall thickness was normal. Systolic function was normal. The estimated ejection fraction was in the range of 55% to 60%. Wall motion was normal; there were no regional wall motion abnormalities. Doppler parameters are consistent with abnormal left ventricular relaxation (grade 1 diastolic dysfunction).  Impressions:  - Normal LV systolic function; grade 1 diastolic dysfunction; trace TR.  Admission HPI: 65 y/o man with PMHx of diabetes, chronic neck pain, anxiety/depression, tremor presents after 3 unprovoked episodes of dizziness this afternoon starting around 1230. These were not associated with any change in activity and each resolved within 1-5 minutes after onset. Dizziness was accompanied with numbness on the right side of his face and a sensation of his right leg becoming very heavy. He also felt shortness of breath without any nausea, chest pain, or diaphoresis. He denies any heart palpitations or symptoms  before or after the episodes. These were all located in the same distribution. He has never had similar symptoms in the past.  After arrival to the ED his symptoms occurred about 3 more times last when having his MRI. Imaging was negative for any stroke and EKG unchanged from previous tracings. No significant lab abnormalities besides some hyperglycemia. Admitted to hospital as suspected TIA events needing workup.  He continues to smoke daily approximately 1/2 ppd. He has been experiencing diarrhea intermittently for 2-3 months that is being worked up with his PCP. He reports discontinuing some of his home meds such as lisinopril recently but states he is still taking aspirin daily.   Hospital Course by problem list:  Numbness, Dysequilibrium, and Leg Weakness: His symptoms entirely resolved upon admission. The etiology was unclear since his presentation would require transient ischemia in the ACA, MCA, and posterior circulation simultaneously. Given the discordant presentation, negative  MRI, and normal carotid dopplers and TTE, there was low suspicion for TIA. Complex migraine was considered, but also seems unlikely given this short episodic nature of the HA, and the lack of photophobia and nausea. It is possible polypharmacy may have played a role given that he is on primidone, diphenhydramine, Norco, and hydroxyzine at home, although he has been on the medications for a long time. The patient indicated that primidone was ineffective for his ET. Neurology recommended switching his ASA to clopidogrel as well as an outpatient EEG to query partial seizures.   T2DM: CBGs in the 200s during admission on SSI-M. Discharged on home medication. A1c was pending on discharge.  Hyperlipidemia: LDL 111 this admission. Started atorvastatin 40 mg daily  Essential Tremor: Noted on exam. No resting tremor. Continued on primidone   Chronic Neck Pain: Continued on  Norco 5-325 q6h prn  Tobacco use disorder:  Encouraged smoking cessation.   Discharge Vitals:   BP 131/76 mmHg  Pulse 57  Temp(Src) 97.9 F (36.6 C) (Oral)  Resp 16  Ht 5\' 8"  (1.727 m)  Wt 167 lb (75.751 kg)  BMI 25.40 kg/m2  SpO2 99%  Discharge Labs:  Results for orders placed or performed during the hospital encounter of 10/31/15 (from the past 24 hour(s))  CBG monitoring, ED     Status: Abnormal   Collection Time: 10/31/15  2:34 PM  Result Value Ref Range   Glucose-Capillary 226 (H) 65 - 99 mg/dL  Ethanol     Status: None   Collection Time: 10/31/15  2:55 PM  Result Value Ref Range   Alcohol, Ethyl (B) <5 <5 mg/dL  Protime-INR     Status: None   Collection Time: 10/31/15  2:55 PM  Result Value Ref Range   Prothrombin Time 13.8 11.6 - 15.2 seconds   INR 1.04 0.00 - 1.49  APTT     Status: None   Collection Time: 10/31/15  2:55 PM  Result Value Ref Range   aPTT 25 24 - 37 seconds  CBC     Status: Abnormal   Collection Time: 10/31/15  2:55 PM  Result Value Ref Range   WBC 6.7 4.0 - 10.5 K/uL   RBC 5.71 4.22 - 5.81 MIL/uL   Hemoglobin 14.8 13.0 - 17.0 g/dL   HCT 42.7 39.0 - 52.0 %   MCV 74.8 (L) 78.0 - 100.0 fL   MCH 25.9 (L) 26.0 - 34.0 pg   MCHC 34.7 30.0 - 36.0 g/dL   RDW 13.8 11.5 - 15.5 %   Platelets 198 150 - 400 K/uL  Differential     Status: None   Collection Time: 10/31/15  2:55 PM  Result Value Ref Range   Neutrophils Relative % 58 %   Lymphocytes Relative 34 %   Monocytes Relative 7 %   Eosinophils Relative 1 %   Basophils Relative 0 %   Neutro Abs 3.8 1.7 - 7.7 K/uL   Lymphs Abs 2.3 0.7 - 4.0 K/uL   Monocytes Absolute 0.5 0.1 - 1.0 K/uL   Eosinophils Absolute 0.1 0.0 - 0.7 K/uL   Basophils Absolute 0.0 0.0 - 0.1 K/uL   Smear Review MORPHOLOGY UNREMARKABLE   Comprehensive metabolic panel     Status: Abnormal   Collection Time: 10/31/15  2:55 PM  Result Value Ref Range   Sodium 137 135 - 145 mmol/L   Potassium 4.0 3.5 - 5.1 mmol/L   Chloride 103 101 - 111 mmol/L   CO2 25 22 - 32  mmol/L     Glucose, Bld 258 (H) 65 - 99 mg/dL   BUN 5 (L) 6 - 20 mg/dL   Creatinine, Ser 0.92 0.61 - 1.24 mg/dL   Calcium 9.8 8.9 - 10.3 mg/dL   Total Protein 7.0 6.5 - 8.1 g/dL   Albumin 4.3 3.5 - 5.0 g/dL   AST 19 15 - 41 U/L   ALT 14 (L) 17 - 63 U/L   Alkaline Phosphatase 56 38 - 126 U/L   Total Bilirubin 0.9 0.3 - 1.2 mg/dL   GFR calc non Af Amer >60 >60 mL/min   GFR calc Af Amer >60 >60 mL/min   Anion gap 9 5 - 15  I-stat troponin, ED (not at [facility], [facility])     Status: None   Collection Time: 10/31/15  3:08 PM  Result Value Ref Range   Troponin i, poc 0.01 0.00 - 0.08 ng/mL   Comment 3          I-Stat Chem 8, ED  (not at [facility], [facility])     Status: Abnormal   Collection Time: 10/31/15  3:09 PM  Result Value Ref Range   Sodium 140 135 - 145 mmol/L   Potassium 3.8 3.5 - 5.1 mmol/L   Chloride 99 (L) 101 - 111 mmol/L   BUN 5 (L) 6 - 20 mg/dL   Creatinine, Ser 0.70 0.61 - 1.24 mg/dL   Glucose, Bld 254 (H) 65 - 99 mg/dL   Calcium, Ion 1.20 1.13 - 1.30 mmol/L   TCO2 24 0 - 100 mmol/L   Hemoglobin 16.3 13.0 - 17.0 g/dL   HCT 48.0 39.0 - 52.0 %  Urine rapid drug screen (hosp performed)not at [facility]     Status: Abnormal   Collection Time: 10/31/15  4:03 PM  Result Value Ref Range   Opiates POSITIVE (A) NONE DETECTED   Cocaine NONE DETECTED NONE DETECTED   Benzodiazepines NONE DETECTED NONE DETECTED   Amphetamines NONE DETECTED NONE DETECTED   Tetrahydrocannabinol NONE DETECTED NONE DETECTED   Barbiturates NONE DETECTED NONE DETECTED  Urinalysis, Routine w reflex microscopic (not at [facility])     Status: Abnormal   Collection Time: 10/31/15  4:03 PM  Result Value Ref Range   Color, Urine COLORLESS (A) YELLOW   APPearance CLEAR CLEAR   Specific Gravity, Urine 1.005 1.005 - 1.030   pH 6.0 5.0 - 8.0   Glucose, UA 500 (A) NEGATIVE mg/dL   Hgb urine dipstick SMALL (A) NEGATIVE   Bilirubin Urine NEGATIVE NEGATIVE   Ketones, ur NEGATIVE NEGATIVE mg/dL   Protein, ur NEGATIVE NEGATIVE mg/dL   Nitrite  NEGATIVE NEGATIVE   Leukocytes, UA NEGATIVE NEGATIVE  Urine microscopic-add on     Status: Abnormal   Collection Time: 10/31/15  4:03 PM  Result Value Ref Range   Squamous Epithelial / LPF 0-5 (A) NONE SEEN   WBC, UA NONE SEEN 0 - 5 WBC/hpf   RBC / HPF 0-5 0 - 5 RBC/hpf   Bacteria, UA NONE SEEN NONE SEEN  Lipid panel     Status: Abnormal   Collection Time: 10/31/15  9:38 PM  Result Value Ref Range   Cholesterol 188 0 - 200 mg/dL   Triglycerides 160 (H) <150 mg/dL   HDL 45 >40 mg/dL   Total CHOL/HDL Ratio 4.2 RATIO   VLDL 32 0 - 40 mg/dL   LDL Cholesterol 111 (H) 0 - 99 mg/dL  Magnesium     Status: None   Collection Time: 10/31/15  9:38  PM  Result Value Ref Range   Magnesium 1.8 1.7 - 2.4 mg/dL  Phosphorus     Status: None   Collection Time: 10/31/15  9:38 PM  Result Value Ref Range   Phosphorus 3.9 2.5 - 4.6 mg/dL  Vitamin B12     Status: None   Collection Time: 10/31/15  9:38 PM  Result Value Ref Range   Vitamin B-12 312 180 - 914 pg/mL  TSH     Status: None   Collection Time: 10/31/15  9:38 PM  Result Value Ref Range   TSH 1.247 0.350 - 4.500 uIU/mL  Glucose, capillary     Status: Abnormal   Collection Time: 10/31/15 10:11 PM  Result Value Ref Range   Glucose-Capillary 229 (H) 65 - 99 mg/dL   Comment 1 Notify RN    Comment 2 Document in Chart   MRSA PCR Screening     Status: None   Collection Time: 11/01/15  4:28 AM  Result Value Ref Range   MRSA by PCR NEGATIVE NEGATIVE  Glucose, capillary     Status: Abnormal   Collection Time: 11/01/15  6:35 AM  Result Value Ref Range   Glucose-Capillary 230 (H) 65 - 99 mg/dL   Comment 1 Notify RN    Comment 2 Document in Chart   Glucose, capillary     Status: Abnormal   Collection Time: 11/01/15 11:15 AM  Result Value Ref Range   Glucose-Capillary 257 (H) 65 - 99 mg/dL    Signed: Liberty Handy, MD 11/01/2015, 12:56 PM

## 2015-11-01 NOTE — Evaluation (Signed)
Physical Therapy Evaluation Patient Details Name: Troy Little MRN: [id number] DOB: 1950-12-17 Today's Date: 11/01/2015   History of Present Illness  Pt with sudden onset of dizziness and Rt side weakness, presented to ED. CVA workup currently negative.  Clinical Impression  Pt with mild Rt plantarflexion and SLS difficulty compared to Lt.  He is independent with mobility with good safety demonstration.  He plans to see MD on 24th for Lt TMJ pain (occurs with yawning, denies with partial jaw opening).  No further PT warranted at this time.  Recommended to pt to practice SLS at counter at home to further improve balance with return demo understanding.    Follow Up Recommendations No PT follow up    Equipment Recommendations  None recommended by PT    Recommendations for Other Services       Precautions / Restrictions Precautions Precautions: None Restrictions Weight Bearing Restrictions: No      Mobility  Bed Mobility Overal bed mobility: Independent             General bed mobility comments: supine to sit  Transfers Overall transfer level: Independent Equipment used: None                Ambulation/Gait Ambulation/Gait assistance: Independent Ambulation Distance (Feet): 350 Feet Assistive device: None Gait Pattern/deviations: WFL(Within Functional Limits)        Stairs Stairs: Yes Stairs assistance: Modified independent (Device/Increase time) Stair Management: One rail Right;Alternating pattern Number of Stairs: 10 General stair comments: knee discomfort while ascending  Wheelchair Mobility    Modified Rankin (Stroke Patients Only) Modified Rankin (Stroke Patients Only) Pre-Morbid Rankin Score: No symptoms Modified Rankin: No significant disability     Balance Overall balance assessment: Independent                               Standardized Balance Assessment Standardized Balance Assessment :  (Tandem Rt 24 sec, Lt 30 sec,  SLS RT 4.69 sec, Lt 28.56 sec)           Pertinent Vitals/Pain Pain Assessment: 0-10 (pt with intermittent LT TMJ pain during yawning)    Home Living Family/patient expects to be discharged to:: Private residence Living Arrangements: Spouse/significant other Available Help at Discharge: Family;Available PRN/intermittently Type of Home: House Home Access: Stairs to enter Entrance Stairs-Rails: Can reach both Entrance Stairs-Number of Steps: 2 Home Layout: One level Home Equipment: None Additional Comments: retired    Prior Function Level of Independence: Independent               Hand Dominance   Dominant Hand: Right    Extremity/Trunk Assessment   Upper Extremity Assessment: Overall WFL for tasks assessed           Lower Extremity Assessment: Overall WFL for tasks assessed (Rt hip flexion, knee ext, ankle DF 5/5, Rt PF 4-/5)      Cervical / Trunk Assessment: Normal  Communication   Communication: No difficulties  Cognition Arousal/Alertness: Awake/alert Behavior During Therapy: WFL for tasks assessed/performed Overall Cognitive Status: Within Functional Limits for tasks assessed                      General Comments      Exercises General Exercises - Lower Extremity Heel Raises: AROM;Right;15 reps;Standing (at counter)      Assessment/Plan    PT Assessment Patent does not need any further PT services  PT Diagnosis Generalized weakness  PT Problem List    PT Treatment Interventions     PT Goals (Current goals can be found in the Care Plan section) Acute Rehab PT Goals Patient Stated Goal: go home as soon as able PT Goal Formulation: With patient    Frequency     Barriers to discharge        Co-evaluation               End of Session Equipment Utilized During Treatment: Gait belt Activity Tolerance: Patient tolerated treatment well Patient left: in bed;with call bell/phone within reach Nurse Communication: Mobility  status         Time: [id number] PT Time Calculation (min) (ACUTE ONLY): 16 min   Charges:   PT Evaluation $PT Eval Low Complexity: 1 Procedure     PT G CodesMalka So, PT [phone]  [place] 11/01/2015, 9:44 AM

## 2015-11-02 LAB — COMMENT2 - HEP PANEL

## 2015-11-02 LAB — HEPATITIS C ANTIBODY (REFLEX): HCV Ab: >11 s/co ratio — ABNORMAL HIGH (ref 0.0–0.9)

## 2015-11-03 DIAGNOSIS — I5189 Other ill-defined heart diseases: Secondary | ICD-10-CM | POA: Diagnosis present

## 2015-11-03 DIAGNOSIS — R768 Other specified abnormal immunological findings in serum: Secondary | ICD-10-CM | POA: Diagnosis present

## 2015-11-03 LAB — HEMOGLOBIN A1C
Hgb A1c MFr Bld: 10.5 % — ABNORMAL HIGH (ref 4.8–5.6)
Mean Plasma Glucose: 255 mg/dL

## 2016-11-27 ENCOUNTER — Encounter (HOSPITAL_COMMUNITY): Payer: Self-pay | Admitting: Nurse Practitioner

## 2016-11-27 ENCOUNTER — Emergency Department (HOSPITAL_COMMUNITY)
Admission: EM | Admit: 2016-11-27 | Discharge: 2016-11-27 | Disposition: A | Discharge status: Home / Self Care | Payer: Medicare Other | Attending: Dermatology | Admitting: Dermatology

## 2016-11-27 DIAGNOSIS — Z5321 Procedure and treatment not carried out due to patient leaving prior to being seen by health care provider: Secondary | ICD-10-CM | POA: Insufficient documentation

## 2016-11-27 DIAGNOSIS — Z4800 Encounter for change or removal of nonsurgical wound dressing: Secondary | ICD-10-CM | POA: Diagnosis not present

## 2016-11-27 DIAGNOSIS — F17210 Nicotine dependence, cigarettes, uncomplicated: Secondary | ICD-10-CM | POA: Insufficient documentation

## 2016-11-27 DIAGNOSIS — E119 Type 2 diabetes mellitus without complications: Secondary | ICD-10-CM | POA: Insufficient documentation

## 2016-11-27 DIAGNOSIS — Z7984 Long term (current) use of oral hypoglycemic drugs: Secondary | ICD-10-CM | POA: Diagnosis not present

## 2016-11-27 NOTE — ED Triage Notes (Signed)
Pt presents with c/o pressure wounds to buttocks. The wounds occurred after he fell asleep in a wicker chair for about 10 hours this tuesday. He has wounds to bilateral buttocks. He is diabetic and wants to be sure that he is not going to get an infection in the wounds. He denies any signs of infection now.

## 2016-11-28 ENCOUNTER — Emergency Department (HOSPITAL_COMMUNITY)
Admission: EM | Admit: 2016-11-28 | Discharge: 2016-11-28 | Disposition: A | Discharge status: Home / Self Care | Payer: Non-veteran care | Attending: Emergency Medicine | Admitting: Emergency Medicine

## 2016-11-28 ENCOUNTER — Encounter (HOSPITAL_COMMUNITY): Payer: Self-pay | Admitting: Emergency Medicine

## 2016-11-28 DIAGNOSIS — Z7984 Long term (current) use of oral hypoglycemic drugs: Secondary | ICD-10-CM | POA: Insufficient documentation

## 2016-11-28 DIAGNOSIS — R531 Weakness: Secondary | ICD-10-CM | POA: Diagnosis present

## 2016-11-28 DIAGNOSIS — E860 Dehydration: Secondary | ICD-10-CM | POA: Diagnosis not present

## 2016-11-28 DIAGNOSIS — F17210 Nicotine dependence, cigarettes, uncomplicated: Secondary | ICD-10-CM | POA: Diagnosis not present

## 2016-11-28 DIAGNOSIS — E119 Type 2 diabetes mellitus without complications: Secondary | ICD-10-CM | POA: Diagnosis not present

## 2016-11-28 DIAGNOSIS — Z8673 Personal history of transient ischemic attack (TIA), and cerebral infarction without residual deficits: Secondary | ICD-10-CM | POA: Diagnosis not present

## 2016-11-28 LAB — URINALYSIS, ROUTINE W REFLEX MICROSCOPIC
BACTERIA UA: NONE SEEN
Bilirubin Urine: NEGATIVE
Glucose, UA: >=500 mg/dL — AB
Ketones, ur: NEGATIVE mg/dL
LEUKOCYTES UA: NEGATIVE
Nitrite: NEGATIVE
PH: 6 (ref 5.0–8.0)
Protein, ur: NEGATIVE mg/dL
SQUAMOUS EPITHELIAL / LPF: NONE SEEN
Specific Gravity, Urine: 1.005 (ref 1.005–1.030)
WBC, UA: NONE SEEN WBC/hpf (ref 0–5)

## 2016-11-28 LAB — BASIC METABOLIC PANEL
Anion gap: 10 (ref 5–15)
BUN: 9 mg/dL (ref 6–20)
CHLORIDE: 99 mmol/L — AB (ref 101–111)
CO2: 26 mmol/L (ref 22–32)
Calcium: 9.4 mg/dL (ref 8.9–10.3)
Creatinine, Ser: 1.18 mg/dL (ref 0.61–1.24)
GFR calc Af Amer: >60 mL/min (ref >60)
GFR calc non Af Amer: >60 mL/min (ref >60)
GLUCOSE: 402 mg/dL — AB (ref 65–99)
POTASSIUM: 3.5 mmol/L (ref 3.5–5.1)
Sodium: 135 mmol/L (ref 135–145)

## 2016-11-28 LAB — CBC
HEMATOCRIT: 43 % (ref 39.0–52.0)
Hemoglobin: 14.9 g/dL (ref 13.0–17.0)
MCH: 25.5 pg — ABNORMAL LOW (ref 26.0–34.0)
MCHC: 34.7 g/dL (ref 30.0–36.0)
MCV: 73.5 fL — AB (ref 78.0–100.0)
Platelets: 226 10*3/uL (ref 150–400)
RBC: 5.85 MIL/uL — ABNORMAL HIGH (ref 4.22–5.81)
RDW: 12.9 % (ref 11.5–15.5)
WBC: 8.1 10*3/uL (ref 4.0–10.5)

## 2016-11-28 LAB — MAGNESIUM: MAGNESIUM: 1.9 mg/dL (ref 1.7–2.4)

## 2016-11-28 MED ORDER — MAGNESIUM SULFATE 2 GM/50ML IV SOLN
2.0000 g | Freq: Once | INTRAVENOUS | Status: AC
  Administered 2016-11-28: 2 g via INTRAVENOUS
  Filled 2016-11-28: qty 50

## 2016-11-28 MED ORDER — SODIUM CHLORIDE 0.9 % IV BOLUS (SEPSIS)
1000.0000 mL | Freq: Once | INTRAVENOUS | Status: AC
  Administered 2016-11-28: 1000 mL via INTRAVENOUS

## 2016-11-28 NOTE — ED Notes (Signed)
ED Provider at bedside. 

## 2016-11-28 NOTE — Discharge Instructions (Signed)
U had a long QT on her EKG. This is likely because of some the medications are taken. This is why discussed stopping either the Benadryl or hydroxyzine. Would also say that you should get a repeat EKG in a couple weeks to make sure that is continued to be lower as it was when you're discharged. Also just to remind you to discuss smoking cessation strategies with your primary doctor.

## 2016-11-28 NOTE — ED Notes (Signed)
Patient was able to ambulate hallway without difficulty.

## 2016-11-28 NOTE — ED Triage Notes (Addendum)
Pt c/o lightheadedness, weakness, intermittent irregular rapid heartbeat, "like my head is in a cloud." Also reports LLQ abdominal pain onset about same time. No vision changes, CP, SOB, dizziness, nausea, emesis. Diarrhea is at pt's baseline.

## 2016-11-28 NOTE — ED Provider Notes (Signed)
[facility] DEPT Provider Note   CSN: [id number] Arrival date & time: 11/28/16  1851     History   Chief Complaint Chief Complaint  Patient presents with  . Weakness    HPI Troy Little is a 66 y.o. male.  The history is provided by the patient and medical records.  Weakness  Primary symptoms include dizziness. This is a new problem. The current episode started more than 2 days ago. The problem has not changed since onset.There has been no fever. Pertinent negatives include no shortness of breath. There were no medications administered prior to arrival. Associated medical issues do not include trauma.  Palpitations   Associated symptoms include dizziness and weakness. Pertinent negatives include no shortness of breath.    Past Medical History:  Diagnosis Date  . Agoraphobia   . Anxiety   . Diabetes mellitus without complication ([place])   . Essential Tremor   . MDD (major depressive disorder)   . Psychosis     Patient Active Problem List   Diagnosis Date Noted  . Hepatitis C antibody test positive 11/03/2015  . Diastolic dysfunction 09/28/3233  . Hyperlipidemia 11/01/2015  . Chronic neck pain 11/01/2015  . Leg weakness   . TIA (transient ischemic attack) 10/31/2015  . Diabetes mellitus ([place]) 10/31/2015  . Anxiety and depression 10/31/2015  . Status post cholecystectomy 10/31/2015  . Tobacco use 10/31/2015  . Essential tremor 10/31/2015    Past Surgical History:  Procedure Laterality Date  . CHOLECYSTECTOMY         Home Medications    Prior to Admission medications   Medication Sig Start Date End Date Taking? Authorizing Provider  atorvastatin (LIPITOR) 40 MG tablet Take 1 tablet (40 mg total) by mouth daily at 6 PM. 11/01/15  Yes Liberty Handy, MD  HYDROcodone-acetaminophen (NORCO/VICODIN) 5-325 MG tablet Take 1 tablet by mouth every 6 (six) hours as needed for moderate pain.   Yes Historical Provider, MD  hydrOXYzine (ATARAX/VISTARIL) 25 MG tablet  Take 25 mg by mouth 3 (three) times daily as needed for vomiting.   Yes Historical Provider, MD  metFORMIN (GLUCOPHAGE) 500 MG tablet Take 500 mg by mouth 2 (two) times daily with a meal.   Yes Historical Provider, MD  primidone (MYSOLINE) 50 MG tablet Take 50 mg by mouth 2 (two) times daily.    Yes Historical Provider, MD  cetirizine (ZYRTEC) 10 MG tablet Take 1 tablet at bedtime as needed for itching. Patient not taking: Reported on 11/28/2016 01/11/14   Shanon Rosser, MD  clobetasol cream (TEMOVATE) 0.05 % Apply to affected areas of face twice daily. Patient not taking: Reported on 11/28/2016 01/11/14   Shanon Rosser, MD  clopidogrel (PLAVIX) 75 MG tablet Take 1 tablet (75 mg total) by mouth daily. Patient not taking: Reported on 11/28/2016 11/02/15   Liberty Handy, MD  diphenhydrAMINE (BENADRYL) 50 MG tablet Take 50 mg by mouth at bedtime as needed for itching.    Historical Provider, MD    Family History History reviewed. No pertinent family history.  Social History Social History  Substance Use Topics  . Smoking status: Current Every Day Smoker    Packs/day: 0.50    Types: Cigarettes  . Smokeless tobacco: Never Used  . Alcohol use Yes     Allergies   Dye fdc red [red dye] and Prozac [fluoxetine hcl]   Review of Systems Review of Systems  Constitutional: Negative for chills.  Eyes: Negative for pain and itching.  Respiratory: Negative for shortness of  breath.   Cardiovascular: Positive for palpitations.  Genitourinary: Negative for flank pain.  Neurological: Positive for dizziness and weakness.  All other systems reviewed and are negative.    Physical Exam Updated Vital Signs BP 131/73   Pulse (!) 59   Temp 97.9 F (36.6 C) (Oral)   Resp 16   SpO2 99%   Physical Exam  Constitutional: He is oriented to person, place, and time. He appears well-developed and well-nourished.  HENT:  Head: Normocephalic and atraumatic.  Eyes: Conjunctivae and EOM are normal.  Neck: Normal  range of motion.  Cardiovascular: Normal rate.   Pulmonary/Chest: Effort normal. No respiratory distress.  Abdominal: Soft. He exhibits no distension.  Musculoskeletal: Normal range of motion.  Neurological: He is alert and oriented to person, place, and time.  Skin: Skin is warm and dry.  Nursing note and vitals reviewed.    ED Treatments / Results  Labs (all labs ordered are listed, but only abnormal results are displayed) Labs Reviewed  BASIC METABOLIC PANEL - Abnormal; Notable for the following:       Result Value   Chloride 99 (*)    Glucose, Bld 402 (*)    All other components within normal limits  CBC - Abnormal; Notable for the following:    RBC 5.85 (*)    MCV 73.5 (*)    MCH 25.5 (*)    All other components within normal limits  URINALYSIS, ROUTINE W REFLEX MICROSCOPIC - Abnormal; Notable for the following:    Color, Urine COLORLESS (*)    Glucose, UA >=500 (*)    Hgb urine dipstick SMALL (*)    All other components within normal limits  MAGNESIUM    EKG  EKG Interpretation  Date/Time:  "Sunday November 28 2016 18:58:50 EDT Ventricular Rate:  95 PR Interval:    QRS Duration: 111 QT Interval:  449 QTC Calculation: 565 R Axis:   102 Text Interpretation:  Sinus rhythm Left posterior fascicular block Low voltage, precordial leads RSR' in V1 or V2, probably normal variant Borderline T abnormalities, anterior leads Borderline ST elevation, lateral leads Prolonged QT interval Baseline wander in lead(s) II III aVF No significant change since last tracing Confirmed by  MD,  ([id number]) on 11/28/2016 7:01:23 PM       Radiology No results found.  Procedures Procedures (including critical care time)   Counseled patient for approximately 8 minutes regarding smoking cessation. Discussed risks of smoking and how they applied and affected their visit here today. Patient not ready to quit at this time, however will follow up with their primary doctor when they are.     CPT code: 99406: intermediate counseling for smoking cessation   Medications Ordered in ED Medications  sodium chloride 0.9 % bolus 1,000 mL (0 mLs Intravenous Stopped 11/28/16 2117)  magnesium sulfate IVPB 2 g 50 mL (0 g Intravenous Stopped 11/28/16 2126)     Initial Impression / Assessment and Plan / ED Course  I have reviewed the triage vital signs and the nursing notes.  Pertinent labs & imaging results that were available during my care of the patient were reviewed by me and considered in my medical decision making (see chart for details).     65"  year old male here with likely dehydration. On initial EKG did have a prolonged QTC however magnesium and fluids is improved. He is onto QT prolonging medications at home both for the same indication. Patient will pick one of the other and not use both at the  same time otherwise will follow his primary doctor for repeat EKG to ensure no qt syndrome.  Also counseled him on smoking cessation offered medications however he declined and said he would like to do this through his primary doctor as well.  Final Clinical Impressions(s) / ED Diagnoses   Final diagnoses:  Dehydration  Weakness     Merrily Pew, MD 11/29/16 [phone]

## 2016-12-08 ENCOUNTER — Emergency Department (HOSPITAL_COMMUNITY): Payer: Medicare Other

## 2016-12-08 ENCOUNTER — Encounter (HOSPITAL_COMMUNITY): Payer: Self-pay | Admitting: Emergency Medicine

## 2016-12-08 ENCOUNTER — Emergency Department (HOSPITAL_COMMUNITY)
Admission: EM | Admit: 2016-12-08 | Discharge: 2016-12-08 | Disposition: A | Discharge status: Home / Self Care | Payer: Medicare Other | Attending: Emergency Medicine | Admitting: Emergency Medicine

## 2016-12-08 DIAGNOSIS — Z8673 Personal history of transient ischemic attack (TIA), and cerebral infarction without residual deficits: Secondary | ICD-10-CM | POA: Diagnosis not present

## 2016-12-08 DIAGNOSIS — R072 Precordial pain: Secondary | ICD-10-CM | POA: Insufficient documentation

## 2016-12-08 DIAGNOSIS — E119 Type 2 diabetes mellitus without complications: Secondary | ICD-10-CM | POA: Insufficient documentation

## 2016-12-08 DIAGNOSIS — R0789 Other chest pain: Secondary | ICD-10-CM

## 2016-12-08 DIAGNOSIS — Z79899 Other long term (current) drug therapy: Secondary | ICD-10-CM | POA: Diagnosis not present

## 2016-12-08 DIAGNOSIS — Z7984 Long term (current) use of oral hypoglycemic drugs: Secondary | ICD-10-CM | POA: Diagnosis not present

## 2016-12-08 DIAGNOSIS — F17210 Nicotine dependence, cigarettes, uncomplicated: Secondary | ICD-10-CM | POA: Insufficient documentation

## 2016-12-08 LAB — CBC
HEMATOCRIT: 39.6 % (ref 39.0–52.0)
HEMOGLOBIN: 13.7 g/dL (ref 13.0–17.0)
MCH: 25.3 pg — AB (ref 26.0–34.0)
MCHC: 34.6 g/dL (ref 30.0–36.0)
MCV: 73.2 fL — AB (ref 78.0–100.0)
PLATELETS: 204 10*3/uL (ref 150–400)
RBC: 5.41 MIL/uL (ref 4.22–5.81)
RDW: 13.8 % (ref 11.5–15.5)
WBC: 6.6 10*3/uL (ref 4.0–10.5)

## 2016-12-08 LAB — I-STAT TROPONIN, ED
TROPONIN I, POC: 0 ng/mL (ref 0.00–0.08)
Troponin i, poc: 0 ng/mL (ref 0.00–0.08)

## 2016-12-08 LAB — BASIC METABOLIC PANEL
ANION GAP: 8 (ref 5–15)
BUN: 8 mg/dL (ref 6–20)
CHLORIDE: 107 mmol/L (ref 101–111)
CO2: 22 mmol/L (ref 22–32)
CREATININE: 0.97 mg/dL (ref 0.61–1.24)
Calcium: 9.2 mg/dL (ref 8.9–10.3)
GFR calc non Af Amer: >60 mL/min (ref >60)
Glucose, Bld: 166 mg/dL — ABNORMAL HIGH (ref 65–99)
Potassium: 3.5 mmol/L (ref 3.5–5.1)
SODIUM: 137 mmol/L (ref 135–145)

## 2016-12-08 LAB — CBG MONITORING, ED: Glucose-Capillary: 158 mg/dL — ABNORMAL HIGH (ref 65–99)

## 2016-12-08 MED ORDER — SUCRALFATE 1 GM/10ML PO SUSP: 1.0000 g | Freq: Three times a day (TID) | ORAL | 0 refills | Status: DC

## 2016-12-08 MED ORDER — GI COCKTAIL ~~LOC~~
30.0000 mL | Freq: Once | ORAL | Status: AC
  Administered 2016-12-08: 30 mL via ORAL
  Filled 2016-12-08: qty 30

## 2016-12-08 NOTE — ED Provider Notes (Signed)
[facility] DEPT Provider Note   CSN: [id number] Arrival date & time: 12/08/16  1945     History   Chief Complaint Chief Complaint  Patient presents with  . Chest Pain    HPI Troy Little is a 66 y.o. male with 4 hours of dull substernal ache nonradiating. He explains that he was taking a nap and woke up feeling sweaty with that discomfort. He hasn't tried anything for the pain. He also admits to eating and going straight to sleep. He endorses a history of reflux problems but has stopped all medications because he was recently admitted and thought that his troubles were related to medications. He explains that he initially thought that these were gas pains, but after passing gas he did not get relief. He has seen his primary care provider at the [facility] yesterday but was concerned that he was not going to get the results of his blood work for months. He denies nausea, vomiting, fever, chills, shortness of breath, cough or other symptoms.  HPI  Past Medical History:  Diagnosis Date  . Agoraphobia   . Anxiety   . Diabetes mellitus without complication ([place])   . Essential Tremor   . MDD (major depressive disorder)   . Psychosis     Patient Active Problem List   Diagnosis Date Noted  . Hepatitis C antibody test positive 11/03/2015  . Diastolic dysfunction 74/25/9563  . Hyperlipidemia 11/01/2015  . Chronic neck pain 11/01/2015  . Leg weakness   . TIA (transient ischemic attack) 10/31/2015  . Diabetes mellitus ([place]) 10/31/2015  . Anxiety and depression 10/31/2015  . Status post cholecystectomy 10/31/2015  . Tobacco use 10/31/2015  . Essential tremor 10/31/2015    Past Surgical History:  Procedure Laterality Date  . CHOLECYSTECTOMY         Home Medications    Prior to Admission medications   Medication Sig Start Date End Date Taking? Authorizing Provider  atorvastatin (LIPITOR) 40 MG tablet Take 1 tablet (40 mg total) by mouth daily at 6 PM. 11/01/15  Yes Liberty Handy, MD  glipiZIDE (GLUCOTROL) 5 MG tablet Take 5 mg by mouth daily before breakfast.   Yes Historical Provider, MD  HYDROcodone-acetaminophen (NORCO/VICODIN) 5-325 MG tablet Take 1 tablet by mouth every 6 (six) hours as needed for moderate pain.   Yes Historical Provider, MD  metFORMIN (GLUCOPHAGE) 500 MG tablet Take 500 mg by mouth 2 (two) times daily with a meal.   Yes Historical Provider, MD  pantoprazole (PROTONIX) 40 MG tablet Take 40 mg by mouth daily.   Yes Historical Provider, MD  primidone (MYSOLINE) 50 MG tablet Take 50 mg by mouth 2 (two) times daily.    Yes Historical Provider, MD  cetirizine (ZYRTEC) 10 MG tablet Take 1 tablet at bedtime as needed for itching. Patient not taking: Reported on 11/28/2016 01/11/14   Shanon Rosser, MD  clobetasol cream (TEMOVATE) 0.05 % Apply to affected areas of face twice daily. Patient not taking: Reported on 11/28/2016 01/11/14   Shanon Rosser, MD  clopidogrel (PLAVIX) 75 MG tablet Take 1 tablet (75 mg total) by mouth daily. Patient not taking: Reported on 11/28/2016 11/02/15   Liberty Handy, MD  diphenhydrAMINE (BENADRYL) 50 MG tablet Take 50 mg by mouth at bedtime as needed for itching.    Historical Provider, MD    Family History No family history on file.  Social History Social History  Substance Use Topics  . Smoking status: Current Every Day Smoker  Packs/day: 0.50    Types: Cigarettes  . Smokeless tobacco: Never Used  . Alcohol use Yes     Allergies   Dye fdc red [red dye] and Prozac [fluoxetine hcl]   Review of Systems Review of Systems  Constitutional: Negative for chills, diaphoresis and fever.  HENT: Negative for congestion, ear pain and sore throat.   Eyes: Negative for pain and visual disturbance.  Respiratory: Negative for cough, choking, shortness of breath, wheezing and stridor.   Cardiovascular: Positive for chest pain. Negative for palpitations.  Gastrointestinal: Positive for diarrhea. Negative for abdominal distention,  abdominal pain, blood in stool, nausea and vomiting.       Patient reports nonbloody loose stools  Genitourinary: Negative for difficulty urinating, dysuria, flank pain, frequency and hematuria.  Musculoskeletal: Negative for arthralgias, back pain, myalgias, neck pain and neck stiffness.  Skin: Negative for color change, pallor and rash.  Neurological: Negative for dizziness, tremors, seizures, syncope, speech difficulty, weakness, light-headedness, numbness and headaches.     Physical Exam Updated Vital Signs BP 135/69 (BP Location: Left Arm)   Pulse 69   Temp 98.1 F (36.7 C) (Oral)   Resp 20   Ht 5\' 8"  (1.727 m)   Wt 74.8 kg   SpO2 97%   BMI 25.09 kg/m   Physical Exam  Constitutional: He appears well-developed and well-nourished. No distress.  Patient is afebrile, nontoxic-appearing, lying comfortably in bed in no acute distress.  HENT:  Head: Normocephalic and atraumatic.  Eyes: Conjunctivae and EOM are normal. Right eye exhibits no discharge. Left eye exhibits no discharge.  Neck: Normal range of motion. Neck supple. No JVD present.  Cardiovascular: Normal rate, regular rhythm, normal heart sounds and intact distal pulses.   No murmur heard. Pulmonary/Chest: Effort normal and breath sounds normal. No respiratory distress. He has no wheezes. He has no rales. He exhibits no tenderness.  Abdominal: Soft. Bowel sounds are normal. He exhibits no distension and no mass. There is no tenderness. There is no rebound and no guarding.  Musculoskeletal: Normal range of motion. He exhibits no edema or deformity.  Neurological: He is alert.  Skin: Skin is warm and dry. No rash noted. He is not diaphoretic. No erythema. No pallor.  Psychiatric: He has a normal mood and affect.  Nursing note and vitals reviewed.    ED Treatments / Results  Labs (all labs ordered are listed, but only abnormal results are displayed) Labs Reviewed  BASIC METABOLIC PANEL - Abnormal; Notable for the  following:       Result Value   Glucose, Bld 166 (*)    All other components within normal limits  CBC - Abnormal; Notable for the following:    MCV 73.2 (*)    MCH 25.3 (*)    All other components within normal limits  CBG MONITORING, ED - Abnormal; Notable for the following:    Glucose-Capillary 158 (*)    All other components within normal limits  I-STAT TROPOININ, ED    EKG  EKG Interpretation  Date/Time:  Wednesday December 08 2016 20:04:56 EDT Ventricular Rate:  69 PR Interval:    QRS Duration: 93 QT Interval:  404 QTC Calculation: 433 R Axis:   92 Text Interpretation:  Sinus rhythm Right axis deviation Borderline T wave abnormalities Baseline wander in lead(s) V1 No STEMI.  Confirmed by LONG MD, JOSHUA [id number]) on 12/08/2016 8:45:01 PM       Radiology Dg Chest 2 View  Result Date: 12/08/2016 CLINICAL DATA:  Chest  pain radiating to shoulders starting tonight. Diabetes. Smoker. EXAM: CHEST  2 VIEW COMPARISON:  Chest x-ray dated 10/31/2015. FINDINGS: Heart size and mediastinal contours are normal. Atherosclerotic change noted at the aortic arch. No evidence of pneumonia. No pleural effusion or pneumothorax seen. Osseous structures about the chest are unremarkable. Slight elevation of the right hemidiaphragm is stable. IMPRESSION: No active cardiopulmonary disease. No evidence of pneumonia or pulmonary edema. Aortic atherosclerosis. Electronically Signed   By: Franki Cabot M.D.   On: 12/08/2016 20:34    Procedures Procedures (including critical care time)  Medications Ordered in ED Medications - No data to display   Initial Impression / Assessment and Plan / ED Course  I have reviewed the triage vital signs and the nursing notes.  Pertinent labs & imaging results that were available during my care of the patient were reviewed by me and considered in my medical decision making (see chart for details).     Patient presents with a few hours of substernal discomfort after  falling asleep immediately after a meal. He does have a history of GERD but has stopped all medications for the past couple weeks after being discharged from the hospital. No recent surgery or immobilization, no hx DVT/PE, no calf pain or swelling. Low suspision for PE.  Labs, chest x-ray and EKG and troponin unremarkable. Discomfort likely a result of non-compliance with gerd medications. Heart score: 3  Patient reports that he is not allergic to red dye. He explains that it was something else in a medication formula that caused him to have a rash reaction in the past. Will give GI cocktail.  Patient was discussed with Dr. Laverta Baltimore who agrees with assessment and plan. Discussed strict return precautions and advised to return to the emergency department if experiencing any new or worsening symptoms. Instructions were understood and patient agreed with discharge plan.  Transferred patient care at end of shift to [facility], PA-C pending Delta troponin. Anticipate discharge home if negative with close PCP follow up.  Final Clinical Impressions(s) / ED Diagnoses   Final diagnoses:  Atypical chest pain    New Prescriptions New Prescriptions   No medications on file     Dossie Der 12/08/16 2239    Margette Fast, MD 12/09/16 1910

## 2016-12-08 NOTE — Discharge Instructions (Signed)

## 2016-12-08 NOTE — ED Notes (Signed)
Pt reports [facility] yesterday and had some tests done but the results won't be back for a few days.

## 2016-12-08 NOTE — ED Triage Notes (Signed)
Pt presents with chest pain radiating to his shoulders that started tonight.  Stated his sugar has been fluctuating a lot lately.  States he thinks he took too much sugar medication today.

## 2017-02-15 IMAGING — MR MR MRA HEAD W/O CM
9 of 12 series · 30 of 48 positions shown · non-contrast
Comparison: None.

CLINICAL DATA: New onset intermittent dizziness and left lower
extremity weakness. Heaviness in the right leg. Tingling in the
right-sided the tongue. 3 episodes, each lasting 1-5 minutes.

EXAM:
MRI HEAD WITHOUT CONTRAST
MRA HEAD WITHOUT CONTRAST
TECHNIQUE: Multiplanar, multiecho pulse sequences of the brain and surrounding
structures were obtained without intravenous contrast. Angiographic
images of the head were obtained using MRA technique without
contrast.

[Series 3: T1 · sagittal · 5.0mm · 0.47mm/px · 1 of 23 slices shown]
[im 1/23]
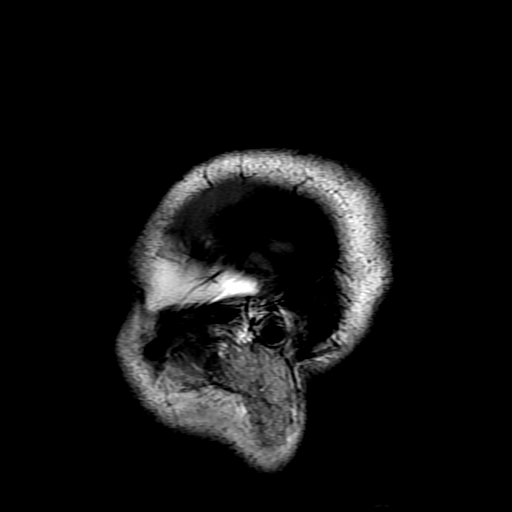

[Series 4: DWI · axial · 3.0mm · 1.09mm/px · z∈[-71,+66]mm · 6 of 94 slices shown (1 of 4)]
[im 1/94]
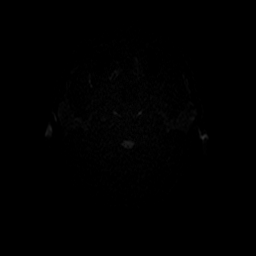
[im 19/94]
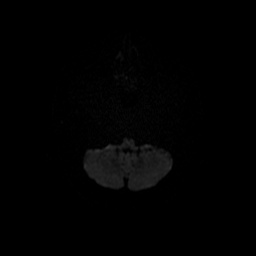
[im 38/94]
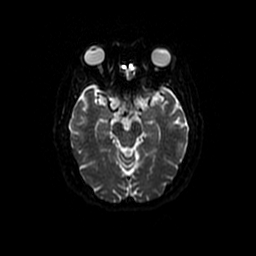
[im 56/94]
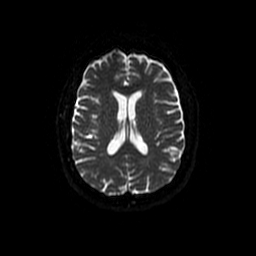
[im 75/94]
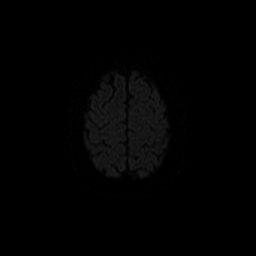
[im 94/94]
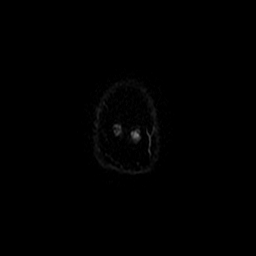

[Series 5: DWI · coronal · 5.0mm · 1.09mm/px · 5 of 66 slices shown (2 of 4)]
[im 1/66]
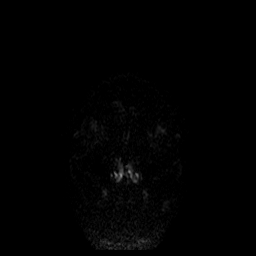
[im 17/66]
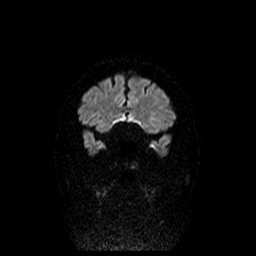
[im 33/66]
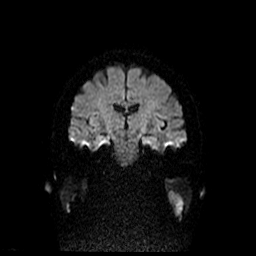
[im 49/66]
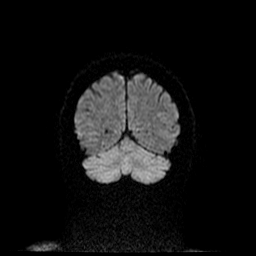
[im 66/66]
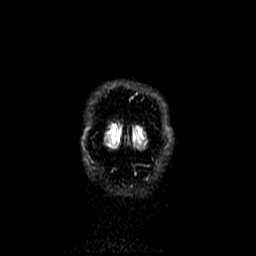

[Series 6: (id) mt fs · axial · 1.2mm · 0.43mm/px · z∈[-74,+1]mm · 6 of 182 slices shown]
[im 1/182]
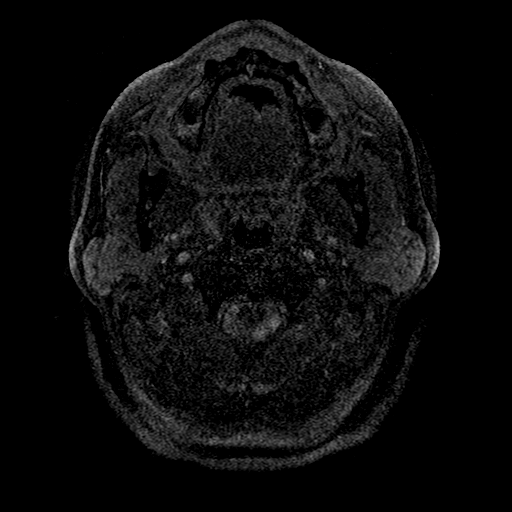
[im 28/182]
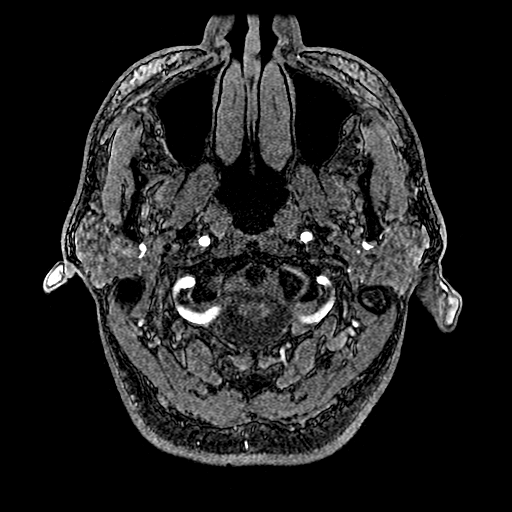
[im 56/182]
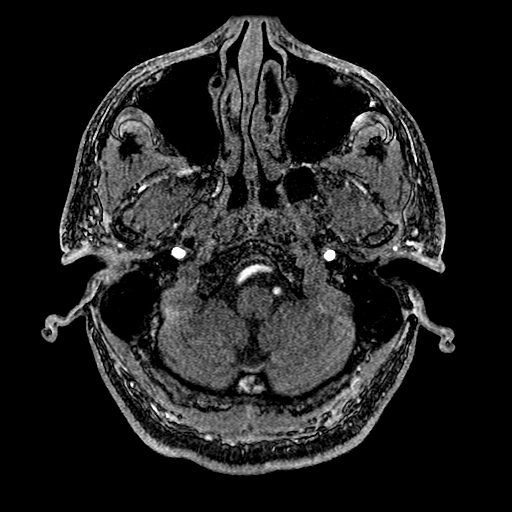
[im 84/182]
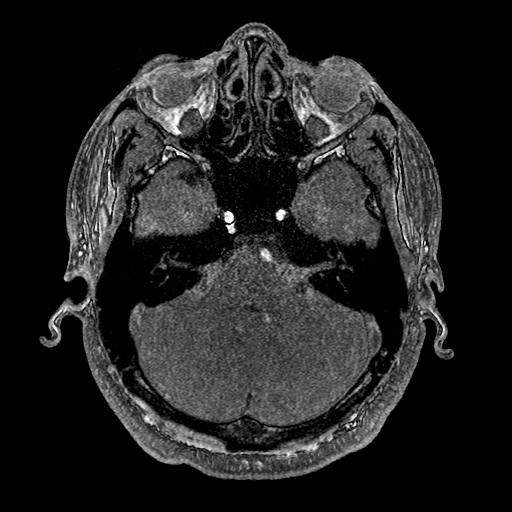
[im 98/182]
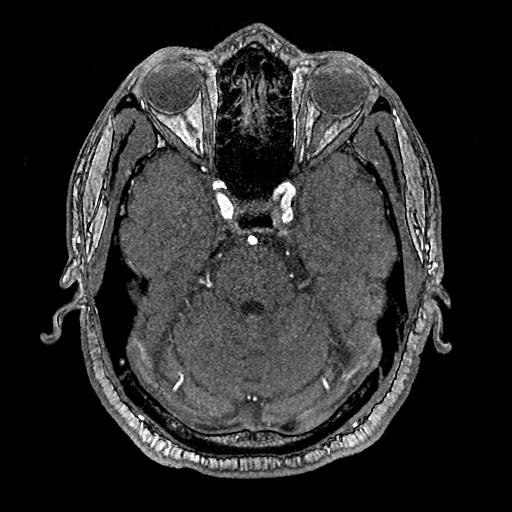
[im 126/182]
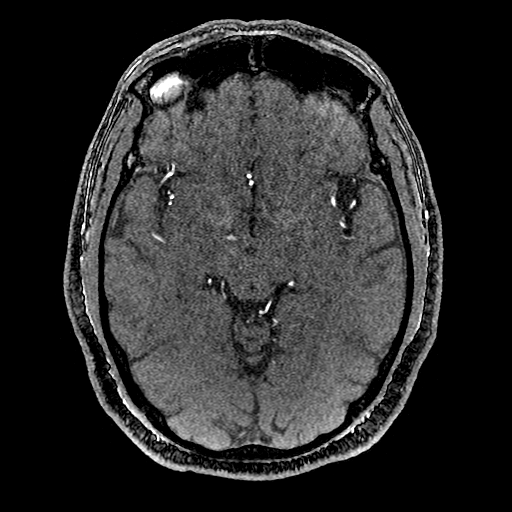

[Series 7: T2 · axial · 5.0mm · 0.43mm/px · z∈[-69,+69]mm · 2 of 24 slices shown (1 of 2)]
[im 1/24]
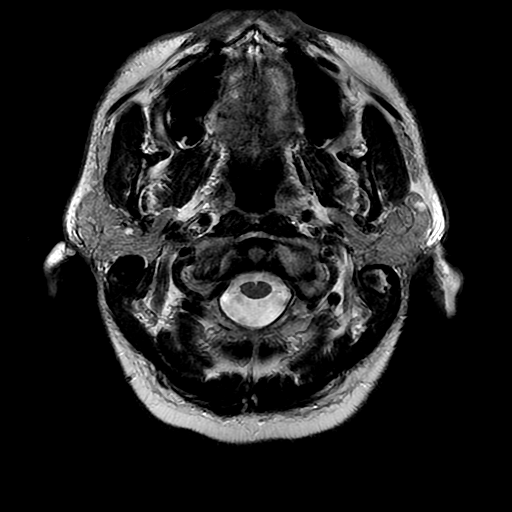
[im 24/24]
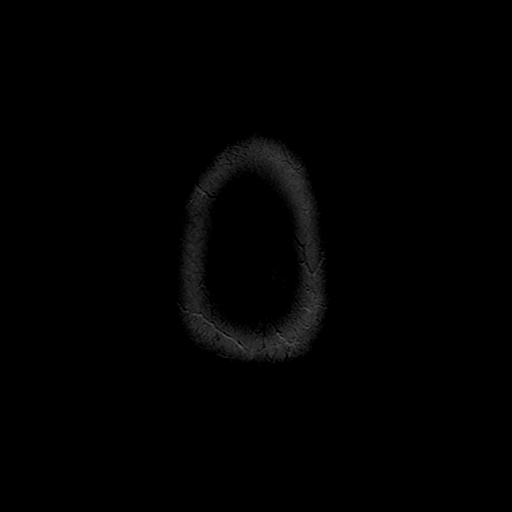

[Series 8: FLAIR · axial · 5.0mm · 0.43mm/px · z∈[-69,+69]mm · 2 of 24 slices shown]
[im 1/24]
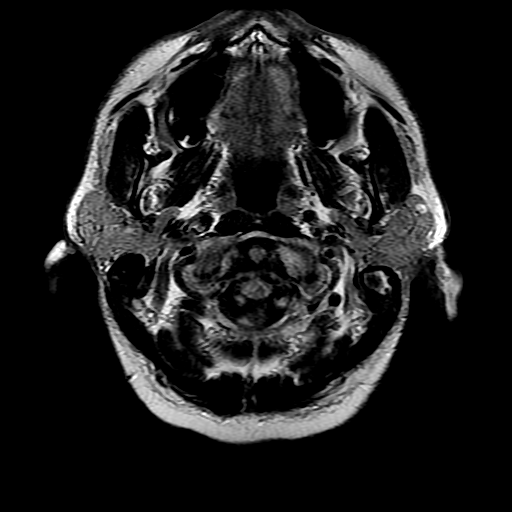
[im 24/24]
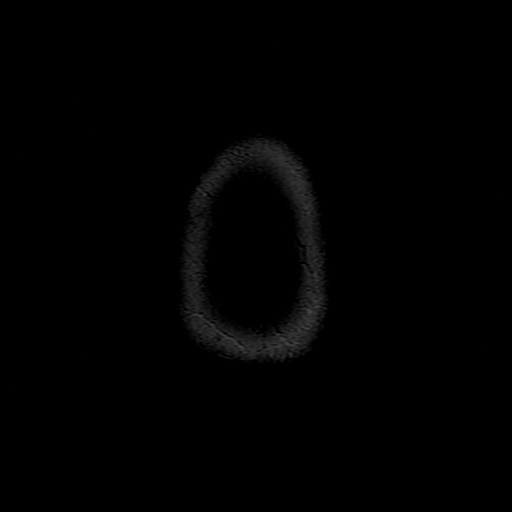

[Series 11: T2 · coronal · 5.0mm · 0.43mm/px · 2 of 28 slices shown (2 of 2)]
[im 1/28]
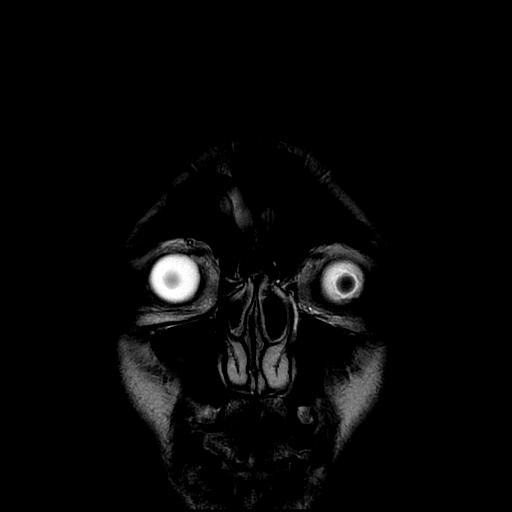
[im 28/28]
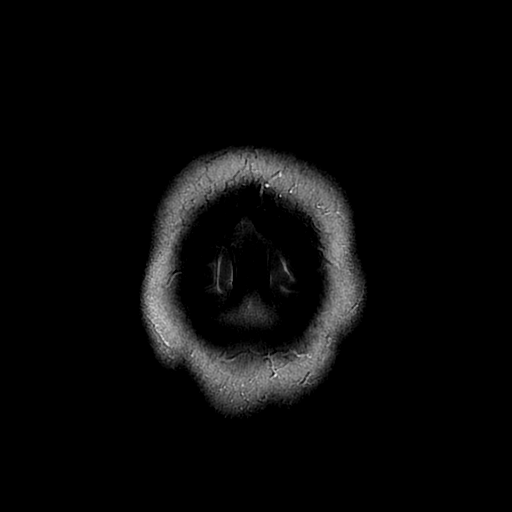

[Series 400: DWI · axial · 3.0mm · 1.09mm/px · z∈[-71,+66]mm · 4 of 47 slices shown (3 of 4)]
[im 1/47]
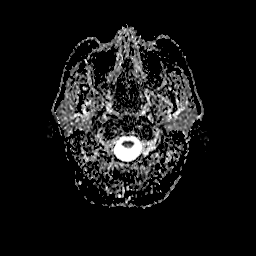
[im 16/47]
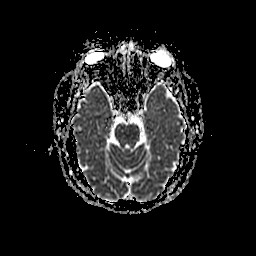
[im 31/47]
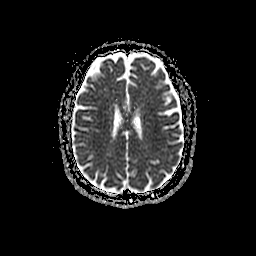
[im 47/47]
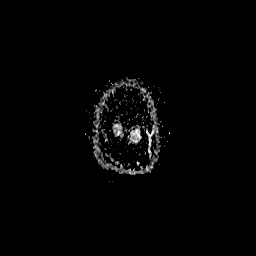

[Series 500: DWI · coronal · 5.0mm · 1.09mm/px · 2 of 33 slices shown (4 of 4)]
[im 1/33]
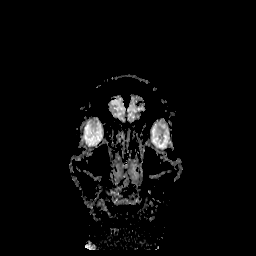
[im 33/33]
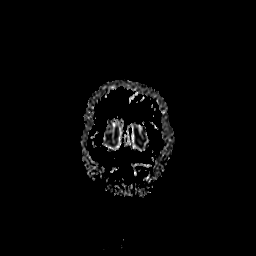

[30 of 48 positions shown; findings below may reference images not displayed]

FINDINGS: MRI HEAD FINDINGS

The diffusion-weighted images demonstrate no evidence for acute or
subacute infarction. No acute hemorrhage or mass lesion is present.
There is no significant white matter disease. The ventricles are of
normal size. No significant extraaxial fluid collection is present.

The internal auditory canals are within normal limits bilaterally.
Flow is present in the major intracranial arteries. The globes and
orbits are intact. The paranasal sinuses are clear. There is minimal
fluid in the mastoid air cells bilaterally. No obstructing
nasopharyngeal lesion is evident.

The skullbase is within normal limits. Midline sagittal images are
unremarkable.

MRA HEAD FINDINGS

Item of the internal carotid arteries are within normal limits from
the high cervical segments through the ICA termini. The A1 and M1
segments are normal. Anterior communicating artery is patent. ACA
and MCA branch vessels are within normal limits.

The vertebral arteries are codominant. The right vertebral artery is
slightly dominant the left. The PICA origins are not well seen. They
are likely obscured by some motion artifact. The basilar artery is
within normal limits. Both posterior cerebral arteries originate
from the basilar tip. The PCA branch vessels are within normal
limits.
IMPRESSION: 1. Normal MRI of the brain for age.
2. Normal MRA circle of Willis without significant proximal
stenosis, aneurysm, or branch vessel occlusion.

## 2017-02-15 IMAGING — DX DG CHEST 2V
2 series · 2 of 2 positions shown · non-contrast
Comparison: 04/05/2005 and prior radiographs

CLINICAL DATA: 64-year-old male with dizziness, right leg weakness
and paresthesia.

EXAM:
CHEST  2 VIEW

[w chest pa]
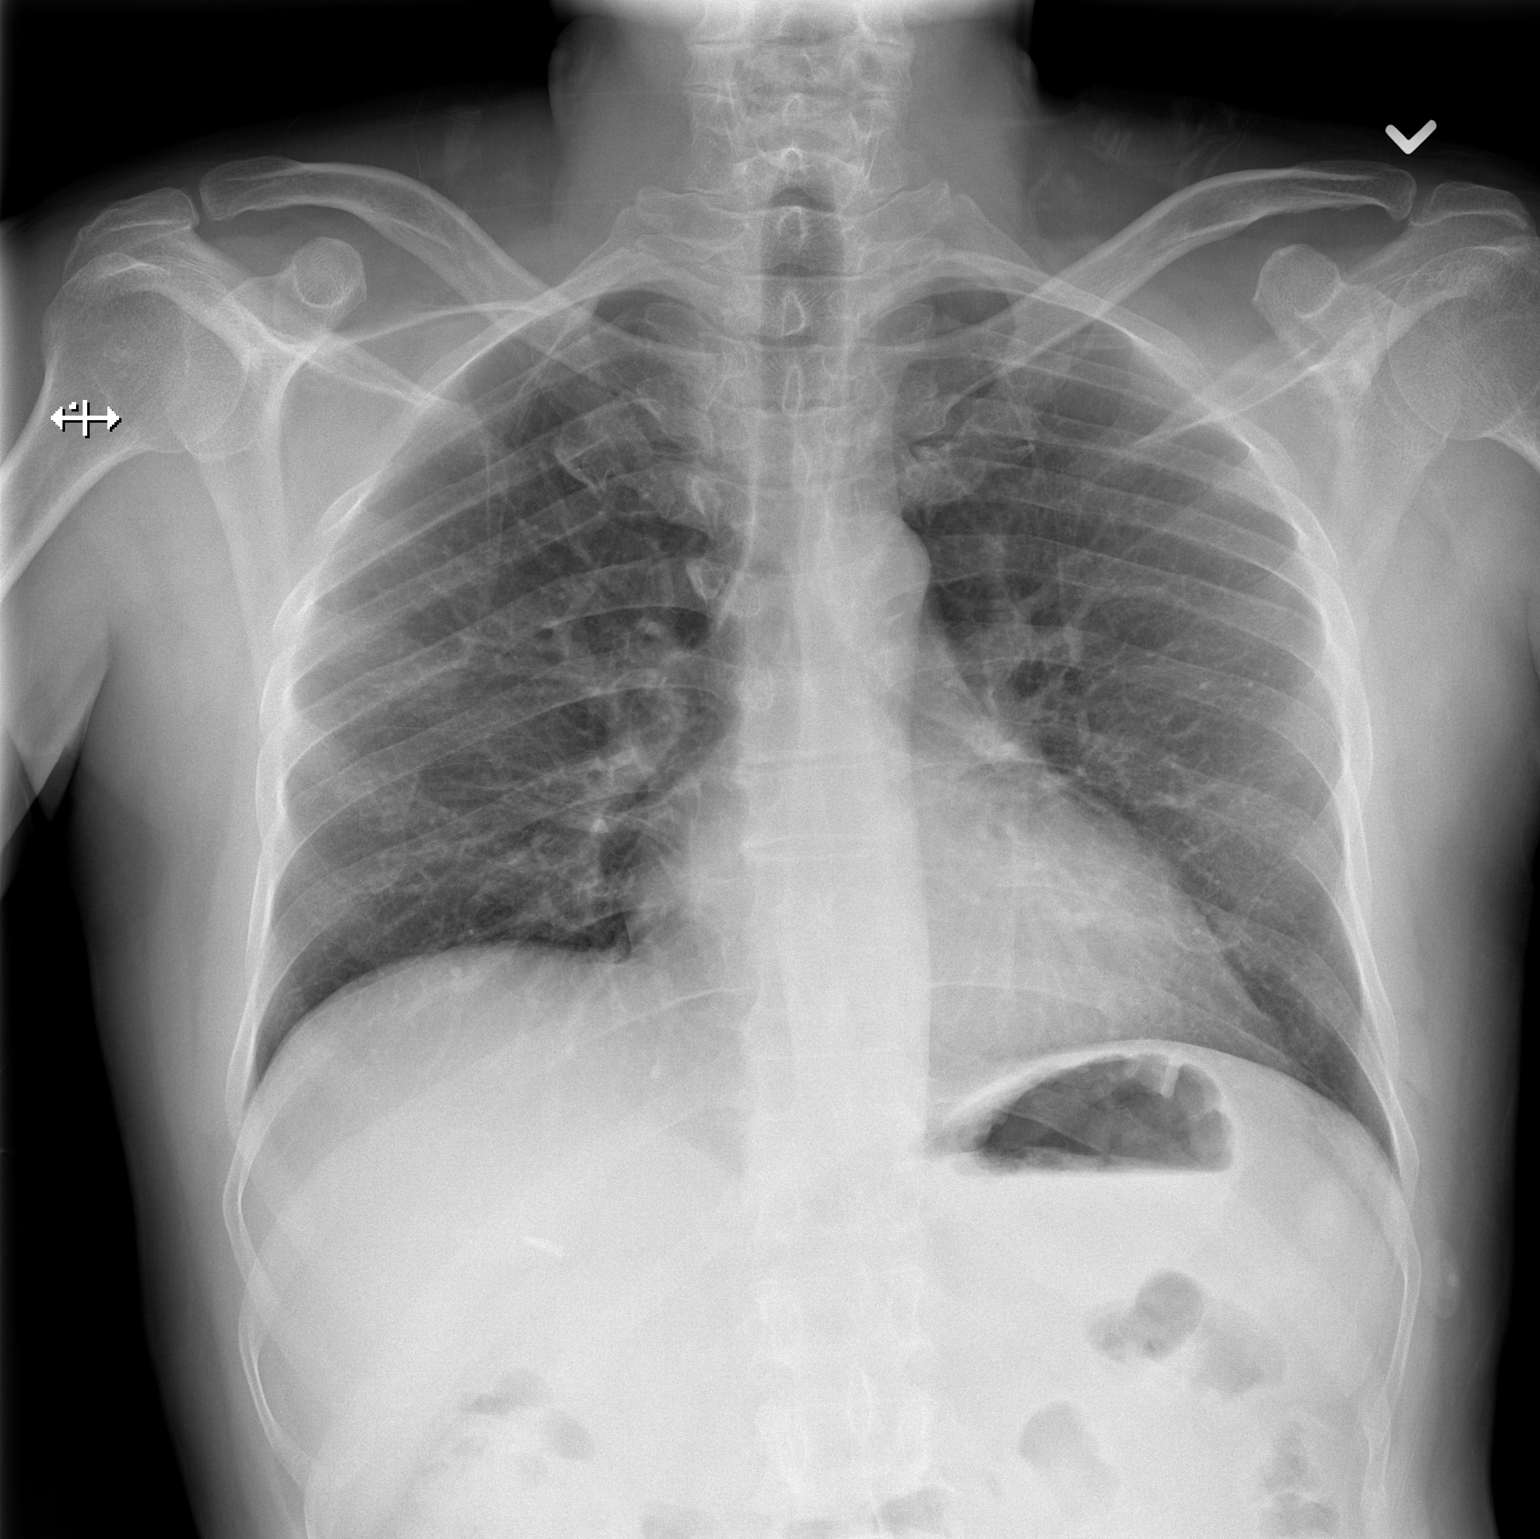

[w chest lat]
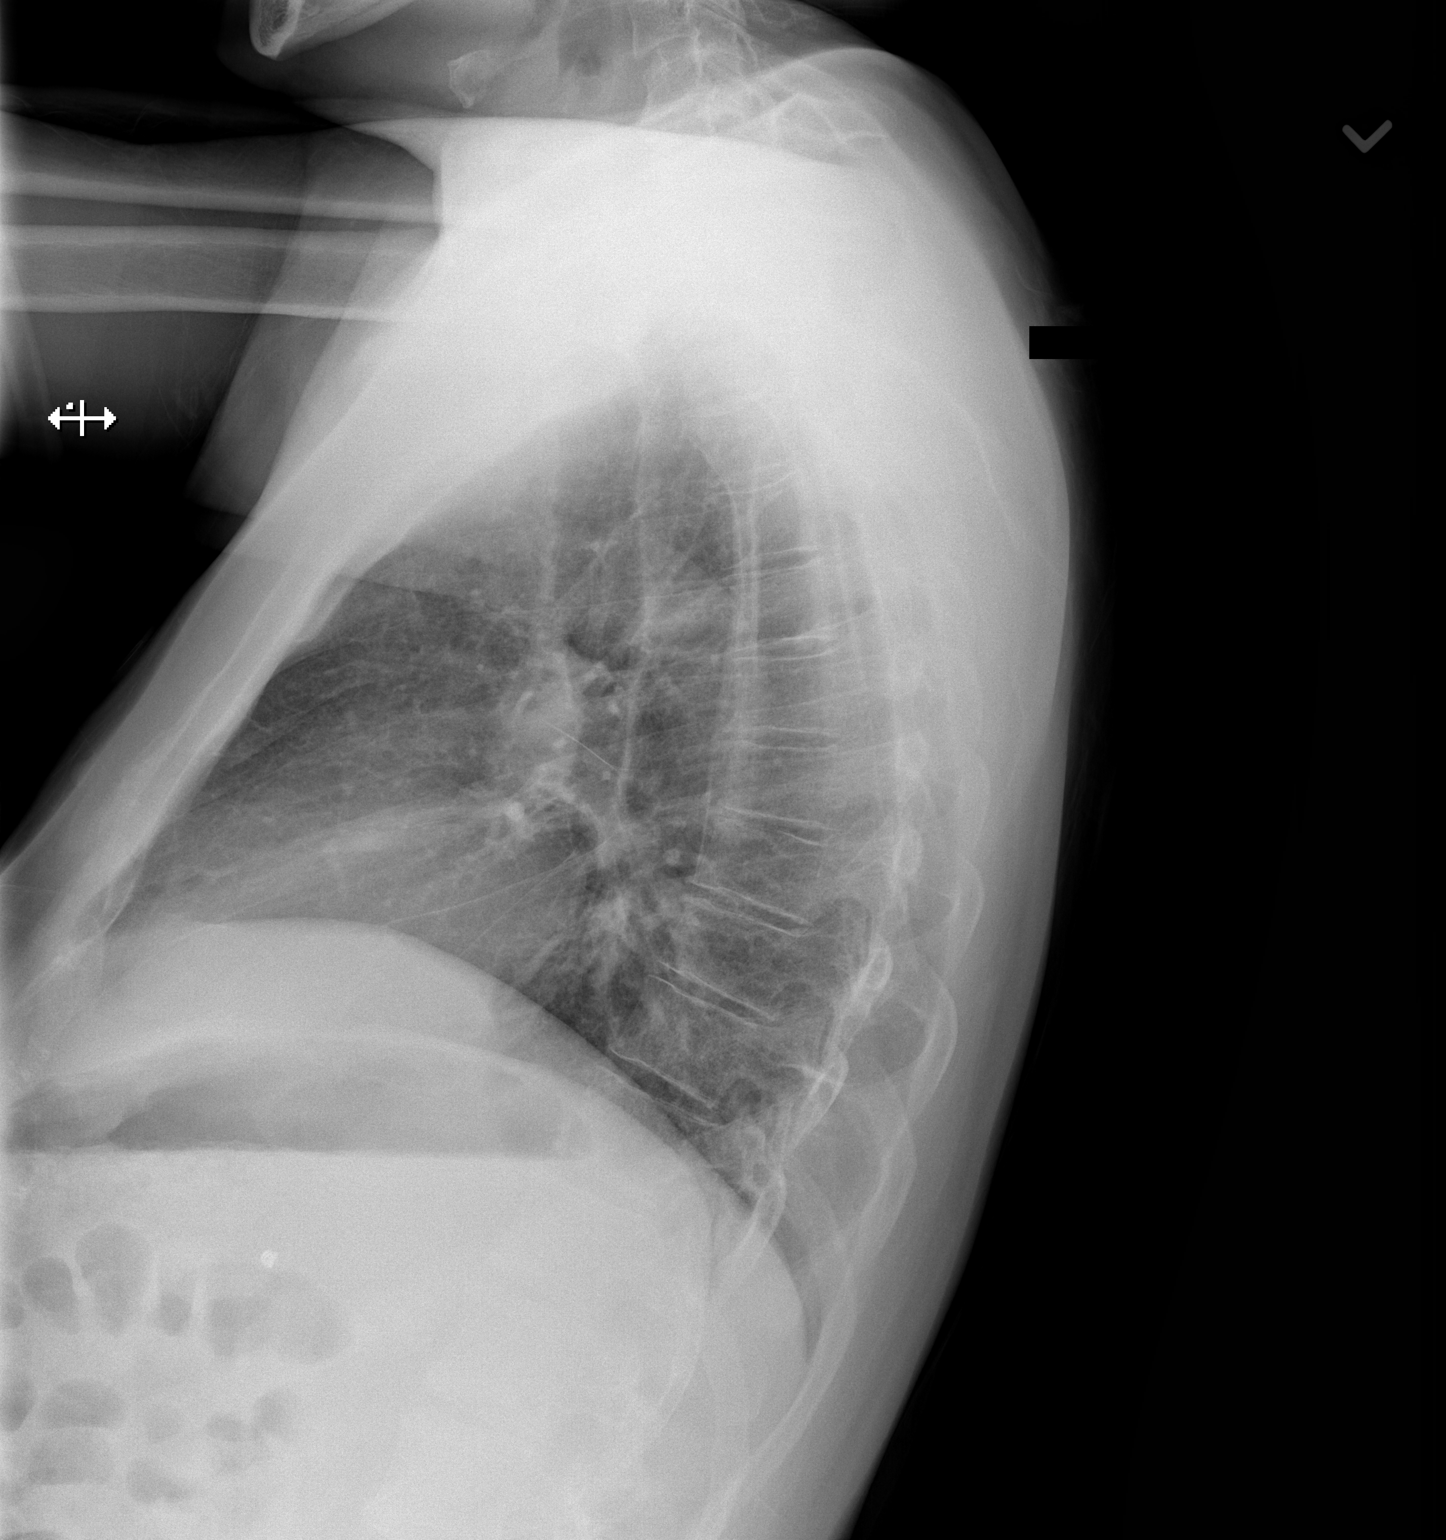

[2 of 2 positions shown; findings below may reference images not displayed]

FINDINGS: The cardiomediastinal silhouette is unremarkable.

There is no evidence of focal airspace disease, pulmonary edema,
suspicious pulmonary nodule/mass, pleural effusion, or pneumothorax.
No acute bony abnormalities are identified.
IMPRESSION: No active cardiopulmonary disease.

## 2017-05-06 ENCOUNTER — Emergency Department (HOSPITAL_COMMUNITY): Payer: Medicare Other

## 2017-05-06 ENCOUNTER — Encounter (HOSPITAL_COMMUNITY): Payer: Self-pay | Admitting: Neurology

## 2017-05-06 ENCOUNTER — Emergency Department (HOSPITAL_COMMUNITY)
Admission: EM | Admit: 2017-05-06 | Discharge: 2017-05-06 | Disposition: A | Discharge status: Home / Self Care | Payer: Medicare Other | Attending: Emergency Medicine | Admitting: Emergency Medicine

## 2017-05-06 DIAGNOSIS — Z79899 Other long term (current) drug therapy: Secondary | ICD-10-CM | POA: Diagnosis not present

## 2017-05-06 DIAGNOSIS — E119 Type 2 diabetes mellitus without complications: Secondary | ICD-10-CM | POA: Diagnosis not present

## 2017-05-06 DIAGNOSIS — F17210 Nicotine dependence, cigarettes, uncomplicated: Secondary | ICD-10-CM | POA: Insufficient documentation

## 2017-05-06 DIAGNOSIS — S199XXA Unspecified injury of neck, initial encounter: Secondary | ICD-10-CM | POA: Diagnosis present

## 2017-05-06 DIAGNOSIS — E7800 Pure hypercholesterolemia, unspecified: Secondary | ICD-10-CM | POA: Diagnosis not present

## 2017-05-06 DIAGNOSIS — Y939 Activity, unspecified: Secondary | ICD-10-CM | POA: Diagnosis not present

## 2017-05-06 DIAGNOSIS — Y92410 Unspecified street and highway as the place of occurrence of the external cause: Secondary | ICD-10-CM | POA: Insufficient documentation

## 2017-05-06 DIAGNOSIS — Z8673 Personal history of transient ischemic attack (TIA), and cerebral infarction without residual deficits: Secondary | ICD-10-CM | POA: Insufficient documentation

## 2017-05-06 DIAGNOSIS — E785 Hyperlipidemia, unspecified: Secondary | ICD-10-CM | POA: Insufficient documentation

## 2017-05-06 DIAGNOSIS — Y998 Other external cause status: Secondary | ICD-10-CM | POA: Insufficient documentation

## 2017-05-06 DIAGNOSIS — Z7984 Long term (current) use of oral hypoglycemic drugs: Secondary | ICD-10-CM | POA: Insufficient documentation

## 2017-05-06 DIAGNOSIS — S161XXA Strain of muscle, fascia and tendon at neck level, initial encounter: Secondary | ICD-10-CM | POA: Diagnosis not present

## 2017-05-06 HISTORY — DX: Pure hypercholesterolemia, unspecified: E78.00
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
## 2017-05-06 NOTE — ED Notes (Signed)
Pt given ginger ale, per MD.

## 2017-05-06 NOTE — Discharge Instructions (Signed)
Please call [facility] at [phone] to make an appointment for follow-up of your neck pain.   If your symptoms worsen, fail to improve or you develop nausea, vomiting, extreme neck pain, loss of sensation or strength on either side of your body(arms or legs), visual changes or other concerning symptoms please call your primary care doctor or return to the emergency department.   Thank you for visiting the [facility] ED and we hope you have a nice day.

## 2017-05-06 NOTE — ED Provider Notes (Signed)
I have personally seen and examined the patient. I have reviewed the documentation on PMH/FH/Soc Hx. I have discussed the plan of care with the resident and patient.  I have reviewed and agree with the resident's documentation. Please see associated encounter note.   EKG Interpretation None         Yeimi Debnam, Grayce Sessions, MD 05/06/17 [id number]
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
## 2018-10-03 ENCOUNTER — Encounter (INDEPENDENT_AMBULATORY_CARE_PROVIDER_SITE_OTHER): Payer: Self-pay

## 2018-10-03 ENCOUNTER — Ambulatory Visit (INDEPENDENT_AMBULATORY_CARE_PROVIDER_SITE_OTHER): Payer: Medicare HMO | Admitting: Family

## 2018-10-03 ENCOUNTER — Encounter: Payer: Self-pay | Admitting: Family

## 2018-10-03 ENCOUNTER — Other Ambulatory Visit (INDEPENDENT_AMBULATORY_CARE_PROVIDER_SITE_OTHER): Payer: Medicare HMO

## 2018-10-03 ENCOUNTER — Ambulatory Visit (INDEPENDENT_AMBULATORY_CARE_PROVIDER_SITE_OTHER)
Admission: RE | Admit: 2018-10-03 | Discharge: 2018-10-03 | Disposition: A | Discharge status: Home / Self Care | Payer: Medicare HMO | Source: Ambulatory Visit | Attending: Family | Admitting: Family

## 2018-10-03 VITALS — BP 138/78 | HR 63 | Temp 98.8°F | Ht 68.0 in | Wt 152.8 lb

## 2018-10-03 DIAGNOSIS — M542 Cervicalgia: Secondary | ICD-10-CM

## 2018-10-03 DIAGNOSIS — E1159 Type 2 diabetes mellitus with other circulatory complications: Secondary | ICD-10-CM | POA: Diagnosis not present

## 2018-10-03 DIAGNOSIS — R109 Unspecified abdominal pain: Secondary | ICD-10-CM

## 2018-10-03 DIAGNOSIS — Z125 Encounter for screening for malignant neoplasm of prostate: Secondary | ICD-10-CM

## 2018-10-03 DIAGNOSIS — Z720 Tobacco use: Secondary | ICD-10-CM | POA: Diagnosis not present

## 2018-10-03 LAB — CBC WITH DIFFERENTIAL/PLATELET
BASOS PCT: 0.8 % (ref 0.0–3.0)
Basophils Absolute: 0.1 10*3/uL (ref 0.0–0.1)
EOS PCT: 2.9 % (ref 0.0–5.0)
Eosinophils Absolute: 0.2 10*3/uL (ref 0.0–0.7)
HEMATOCRIT: 43.5 % (ref 39.0–52.0)
HEMOGLOBIN: 14.6 g/dL (ref 13.0–17.0)
LYMPHS PCT: 41.8 % (ref 12.0–46.0)
Lymphs Abs: 2.7 10*3/uL (ref 0.7–4.0)
MCHC: 33.5 g/dL (ref 30.0–36.0)
MCV: 80 fl (ref 78.0–100.0)
MONOS PCT: 9.3 % (ref 3.0–12.0)
Monocytes Absolute: 0.6 10*3/uL (ref 0.1–1.0)
NEUTROS ABS: 3 10*3/uL (ref 1.4–7.7)
Neutrophils Relative %: 45.2 % (ref 43.0–77.0)
PLATELETS: 169 10*3/uL (ref 150.0–400.0)
RBC: 5.44 Mil/uL (ref 4.22–5.81)
RDW: 13.6 % (ref 11.5–15.5)
WBC: 6.5 10*3/uL (ref 4.0–10.5)

## 2018-10-03 LAB — COMPREHENSIVE METABOLIC PANEL
ALT: 14 U/L (ref 0–53)
AST: 14 U/L (ref 0–37)
Albumin: 4.6 g/dL (ref 3.5–5.2)
Alkaline Phosphatase: 51 U/L (ref 39–117)
BUN: 13 mg/dL (ref 6–23)
CALCIUM: 9.8 mg/dL (ref 8.4–10.5)
CHLORIDE: 105 meq/L (ref 96–112)
CO2: 28 meq/L (ref 19–32)
Creatinine, Ser: 0.96 mg/dL (ref 0.40–1.50)
GFR: 101.06 mL/min (ref >60.00)
Glucose, Bld: 143 mg/dL — ABNORMAL HIGH (ref 70–99)
POTASSIUM: 4 meq/L (ref 3.5–5.1)
Sodium: 140 mEq/L (ref 135–145)
Total Bilirubin: 0.5 mg/dL (ref 0.2–1.2)
Total Protein: 7 g/dL (ref 6.0–8.3)

## 2018-10-03 LAB — MICROALBUMIN / CREATININE URINE RATIO
CREATININE, U: 65.1 mg/dL
MICROALB/CREAT RATIO: 1.1 mg/g (ref 0.0–30.0)
Microalb, Ur: <0.7 mg/dL (ref 0.0–1.9)

## 2018-10-03 LAB — LIPID PANEL
CHOL/HDL RATIO: 3
CHOLESTEROL: 127 mg/dL (ref 0–200)
HDL: 49 mg/dL (ref >39.00)
LDL CALC: 43 mg/dL (ref 0–99)
NONHDL: 78.49
Triglycerides: 178 mg/dL — ABNORMAL HIGH (ref 0.0–149.0)
VLDL: 35.6 mg/dL (ref 0.0–40.0)

## 2018-10-03 LAB — HEMOGLOBIN A1C: HEMOGLOBIN A1C: 7.2 % — AB (ref 4.6–6.5)

## 2018-10-03 LAB — PSA: PSA: 0.43 ng/mL (ref 0.10–4.00)

## 2018-10-03 NOTE — Progress Notes (Signed)
Troy Little is a 68 y.o. male with the following history as recorded in EpicCare:  Patient Active Problem List   Diagnosis Date Noted  . Hepatitis C antibody test positive 11/03/2015  . Diastolic dysfunction 66/29/4765  . Hyperlipidemia 11/01/2015  . Chronic neck pain 11/01/2015  . Leg weakness   . TIA (transient ischemic attack) 10/31/2015  . Diabetes mellitus ([place]) 10/31/2015  . Anxiety and depression 10/31/2015  . Status post cholecystectomy 10/31/2015  . Tobacco use 10/31/2015  . Essential tremor 10/31/2015    Current Outpatient Medications  Medication Sig Dispense Refill  . HYDROcodone-acetaminophen (NORCO/VICODIN) 5-325 MG tablet Take 1 tablet by mouth every 6 (six) hours as needed for moderate pain.    . primidone (MYSOLINE) 50 MG tablet Take 50 mg by mouth 2 (two) times daily.     Marland Kitchen atorvastatin (LIPITOR) 40 MG tablet Take 1 tablet (40 mg total) by mouth daily at 6 PM. (Patient not taking: Reported on 10/03/2018) 30 tablet 3  . cetirizine (ZYRTEC) 10 MG tablet Take 1 tablet at bedtime as needed for itching. (Patient not taking: Reported on 11/28/2016)    . clobetasol cream (TEMOVATE) 0.05 % Apply to affected areas of face twice daily. (Patient not taking: Reported on 11/28/2016) 30 g 0  . clopidogrel (PLAVIX) 75 MG tablet Take 1 tablet (75 mg total) by mouth daily. (Patient not taking: Reported on 11/28/2016) 30 tablet 1  . diphenhydrAMINE (BENADRYL) 50 MG tablet Take 50 mg by mouth at bedtime as needed for itching.    Marland Kitchen glipiZIDE (GLUCOTROL) 5 MG tablet Take 5 mg by mouth daily before breakfast.    . metFORMIN (GLUCOPHAGE) 500 MG tablet Take 1,000 mg by mouth 2 (two) times daily with a meal. Per patient, he takes 1/4 tablet per day "when he needs it"    . pantoprazole (PROTONIX) 40 MG tablet Take 40 mg by mouth daily.    . sucralfate (CARAFATE) 1 GM/10ML suspension Take 10 mLs (1 g total) by mouth 4 (four) times daily -  with meals and at bedtime. (Patient not taking: Reported  on 10/03/2018) 420 mL 0   No current facility-administered medications for this visit.     Allergies: Dye fdc red [red dye] and Prozac [fluoxetine hcl]  Past Medical History:  Diagnosis Date  . Agoraphobia   . Anxiety   . Diabetes mellitus without complication ([place])   . Essential Tremor   . Hypercholesteremia   . MDD (major depressive disorder)   . Psychosis [facility])     Past Surgical History:  Procedure Laterality Date  . CHOLECYSTECTOMY      History reviewed. No pertinent family history.  Social History   Tobacco Use  . Smoking status: Current Every Day Smoker    Packs/day: 0.50    Types: Cigarettes  . Smokeless tobacco: Never Used  Substance Use Topics  . Alcohol use: Yes    Subjective:  Patient presents today as a new patient; has [facility] [place] been under the care of the [place] since 2000; very difficult historian- brings no records or medications with him-notes he has a list of what has been prescribed and what he is willing to take- agrees to get those to me for review;   History of Type 2 Diabetes- unsure of date of last Hgba1c/ unsure of what medications he is taking; trying to manage his blood sugar with diet.  History of tremors- planning to stay with [place] neurologist  History of abdominal pain- very limited historian/ unsure what he is being treated for- wants to get referral to GI; notes he thinks he was taking Zantac- "whatever it was was recalled due to cancer concerns"  Notes he has a history of Hepatitis C- states it was treated to remission at [facility] has no records available today;  Per patient, he has lost 15-20 pounds since 2017; per patient, he has not been trying to lose weight;  Smoker since 1973- smokes 3/4 ppd;  History of chronic cervical pain- notes he has had an MRI in the past; thinks he was supposed to have neck surgery; states this is why his PCP has been giving him hydrocodone;       Objective:  Vitals:   10/03/18 1327  BP: 138/78  Pulse: 63  Temp: 98.8 F (37.1 C)  TempSrc: Oral  SpO2: 97%  Weight: 152 lb 12.8 oz (69.3 kg)  Height: '5\' 8"'  (1.727 m)    General: Well developed, well nourished, in no acute distress  Skin : Warm and dry.  Head: Normocephalic and atraumatic  Eyes: Sclera and conjunctiva clear; pupils round and reactive to light; extraocular movements intact  Lungs: Respirations unlabored; clear to auscultation bilaterally without wheeze, rales, rhonchi  CVS exam: normal rate and regular rhythm.  Abdomen: Soft; nontender; nondistended; normoactive bowel sounds; no masses or hepatosplenomegaly  Neurologic: Alert and oriented; speech intact; face symmetrical; moves all extremities well; CNII-XII intact without focal deficit   Assessment:  1. Type 2 diabetes mellitus with other circulatory complication, without long-term current use of insulin ([place])   2. Prostate cancer screening   3. Tobacco abuse   4. Abdominal pain, unspecified abdominal location   5. Neck pain     Plan:  Patient understands that to take proper care of him and provide safe care, it is essential that I know what medications he is taking; he agrees to get copy of medication list for review; 1. Check CBC, CMP, hgba1c, lipid pane and urine microalbumin today; follow-up in 1 month; 2. Check PSA; 3. Update lung cancer screen especially with unexplained weight loss; 4. Will refer to GI as he requests- stress the necessity that he get his records and take for GI so they can see what treatment has been offered/ done to this point. 5. Update cervical X-ray; will then be able to determine follow-up- may need MRI and/ or referral to specialist.   Patient defers any type of vaccine today;  Return in about 1 month (around 11/03/2018).  Orders Placed This Encounter  Procedures  . DG Cervical Spine 2 or 3 views    Standing Status:   Future    Number of Occurrences:   1    Standing  Expiration Date:   12/02/2019    Order Specific Question:   Reason for Exam (SYMPTOM  OR DIAGNOSIS REQUIRED)    Answer:   neck pain    Order Specific Question:   Preferred imaging location?    Answer:   [facility]    Order Specific Question:   Radiology Contrast Protocol - do NOT remove file path    Answer:   \\[email or web address]  . CBC w/Diff    Standing Status:   Future    Number of Occurrences:   1    Standing Expiration Date:   10/03/2019  . Comp Met (CMET)    Standing Status:   Future    Number of Occurrences:   1    Standing  Expiration Date:   10/03/2019  . Lipid panel    Standing Status:   Future    Number of Occurrences:   1    Standing Expiration Date:   10/04/2019  . HgB A1c    Standing Status:   Future    Number of Occurrences:   1    Standing Expiration Date:   10/03/2019  . PSA    Standing Status:   Future    Number of Occurrences:   1    Standing Expiration Date:   10/03/2019  . Urine Microalbumin w/creat. ratio    Standing Status:   Future    Number of Occurrences:   1    Standing Expiration Date:   10/03/2019  . Ambulatory Referral for Lung Cancer Scre    Referral Priority:   Routine    Referral Type:   Consultation    Referral Reason:   Specialty Services Required    Number of Visits Requested:   1  . Ambulatory referral to Gastroenterology    Referral Priority:   Routine    Referral Type:   Consultation    Referral Reason:   Specialty Services Required    Number of Visits Requested:   1    Requested Prescriptions    No prescriptions requested or ordered in this encounter

## 2018-10-06 ENCOUNTER — Other Ambulatory Visit: Payer: Self-pay | Admitting: Acute Care

## 2018-10-06 DIAGNOSIS — F17210 Nicotine dependence, cigarettes, uncomplicated: Principal | ICD-10-CM

## 2018-10-06 DIAGNOSIS — Z87891 Personal history of nicotine dependence: Secondary | ICD-10-CM

## 2018-10-06 DIAGNOSIS — Z122 Encounter for screening for malignant neoplasm of respiratory organs: Secondary | ICD-10-CM

## 2018-10-11 ENCOUNTER — Telehealth: Payer: Self-pay | Admitting: Internal Medicine

## 2018-10-11 NOTE — Addendum Note (Signed)
Addended by: Marcina Millard on: 10/11/2018 02:52 PM   Modules accepted: Orders

## 2018-10-11 NOTE — Addendum Note (Signed)
Addended by: Marcina Millard on: 10/11/2018 02:50 PM   Modules accepted: Orders

## 2018-10-13 ENCOUNTER — Encounter: Payer: Self-pay | Admitting: Family

## 2018-10-13 ENCOUNTER — Ambulatory Visit (INDEPENDENT_AMBULATORY_CARE_PROVIDER_SITE_OTHER): Payer: Medicare HMO | Admitting: Family

## 2018-10-13 VITALS — BP 140/74 | HR 58 | Temp 98.4°F | Ht 68.0 in | Wt 150.1 lb

## 2018-10-13 DIAGNOSIS — M25562 Pain in left knee: Secondary | ICD-10-CM | POA: Diagnosis not present

## 2018-10-13 DIAGNOSIS — M542 Cervicalgia: Secondary | ICD-10-CM

## 2018-10-13 DIAGNOSIS — E782 Mixed hyperlipidemia: Secondary | ICD-10-CM

## 2018-10-13 DIAGNOSIS — E1159 Type 2 diabetes mellitus with other circulatory complications: Secondary | ICD-10-CM

## 2018-10-13 NOTE — Progress Notes (Signed)
Troy Little is a 68 y.o. male with the following history as recorded in EpicCare:  Patient Active Problem List   Diagnosis Date Noted  . Hepatitis C antibody test positive 11/03/2015  . Diastolic dysfunction 44/11/4740  . Hyperlipidemia 11/01/2015  . Chronic neck pain 11/01/2015  . Leg weakness   . TIA (transient ischemic attack) 10/31/2015  . Diabetes mellitus ([place]) 10/31/2015  . Anxiety and depression 10/31/2015  . Status post cholecystectomy 10/31/2015  . Tobacco use 10/31/2015  . Essential tremor 10/31/2015    Current Outpatient Medications  Medication Sig Dispense Refill  . aspirin EC 81 MG tablet Take 81 mg by mouth daily.    [place] Kitchen glucose blood test strip 1 each by Other route as needed for other. Use as instructed    . HYDROcodone-acetaminophen (NORCO/VICODIN) 5-325 MG tablet Take 1 tablet by mouth every 6 (six) hours as needed for moderate pain.    Mar[place] Kitchen lisinopril (PRINIVIL,ZESTRIL) 40 MG tablet Take 40 mg by mouth daily.    . metFORMIN (GLUCOPHAGE) 500 MG tablet Take 1,000 mg by mouth 2 (two) times daily with a meal. Per patient, he takes 1/4 tablet per day "when he needs it"    . Omega-3 Fatty Acids (FISH OIL) 1000 MG CAPS Take by mouth.    . primidone (MYSOLINE) 50 MG tablet Take 50 mg by mouth 2 (two) times daily.     . propranolol (INDERAL) 10 MG tablet Take 10 mg by mouth 2 (two) times daily. Patient takes 10 mg ( 3 tablets 2 x daily)    . simvastatin (ZOCOR) 20 MG tablet Take 20 mg by mouth daily.     No current facility-administered medications for this visit.     Allergies: Dye fdc red [red dye] and Prozac [fluoxetine hcl]  Past Medical History:  Diagnosis Date  . Agoraphobia   . Anxiety   . Diabetes mellitus without complication ([place])   . Essential Tremor   . Hypercholesteremia   . MDD (major depressive disorder)   . Psychosis [facility])     Past Surgical History:  Procedure Laterality Date  . CHOLECYSTECTOMY      History reviewed. No pertinent family  history.  Social History   Tobacco Use  . Smoking status: Current Every Day Smoker    Packs/day: 0.50    Types: Cigarettes  . Smokeless tobacco: Never Used  Substance Use Topics  . Alcohol use: Yes    Subjective:   Patient presents with concerns for left knee pain "for some period of time." Became concerned when developed sensation of numbness in the left knee; symptoms are localized on the outer part of his knee; no swelling, no sensation that knee is going to give out on him.  Would like to proceed with cervical MRI that we called him about last week- has decided he prefers to keep majority of his care out of the [place] if possible.   Would like to review labs that were drawn at last OV;  Objective:  Vitals:   10/13/18 1048  BP: 140/74  Pulse: (!) 58  Temp: 98.4 F (36.9 C)  TempSrc: Oral  SpO2: 99%  Weight: 150 lb 1.9 oz (68.1 kg)  Height: 5\' 8"  (1.727 m)    General: Well developed, well nourished, in no acute distress  Skin : Warm and dry.  Head: Normocephalic and atraumatic  Lungs: Respirations unlabored; clear to auscultation bilaterally without wheeze, rales, rhonchi  CVS exam: normal rate and regular rhythm.  Musculoskeletal: No deformities;  no active joint inflammation  Extremities: No edema, cyanosis, clubbing  Vessels: Symmetric bilaterally  Neurologic: Alert and oriented; speech intact; face symmetrical; moves all extremities well; CNII-XII intact without focal deficit   Assessment:  1. Acute pain of left knee   2. Cervicalgia   3. Type 2 diabetes mellitus with other circulatory complication, without long-term current use of insulin ([place])   4. Mixed hyperlipidemia     Plan:  1. Exam is unremarkable; suspect ultrasound or MRI will be most beneficial as opposed to X-ray; recommend to see sports medicine. 2. Update order for cervical MRI; 3. Stable; continue same medication; Hgba1c at 7.2; stressed need to quit smoking. 4. Stable; continue same medication;    Return for 1 week with Dr. Raeford Razor.  Orders Placed This Encounter  Procedures  . MR Cervical Spine Wo Contrast    Standing Status:   Future    Standing Expiration Date:   12/12/2019    Order Specific Question:   What is the patient's sedation requirement?    Answer:   No Sedation    Order Specific Question:   Does the patient have a pacemaker or implanted devices?    Answer:   No    Order Specific Question:   Preferred imaging location?    Answer:   [facility] (table limit-550lbs)    Order Specific Question:   Radiology Contrast Protocol - do NOT remove file path    Answer:   \\[email or web address]    Requested Prescriptions    No prescriptions requested or ordered in this encounter

## 2018-10-20 ENCOUNTER — Encounter: Payer: Self-pay | Admitting: Family Medicine

## 2018-10-20 ENCOUNTER — Ambulatory Visit (INDEPENDENT_AMBULATORY_CARE_PROVIDER_SITE_OTHER): Payer: Medicare HMO | Admitting: Family Medicine

## 2018-10-20 VITALS — BP 130/68 | HR 63 | Ht 68.0 in | Wt 149.0 lb

## 2018-10-20 DIAGNOSIS — R200 Anesthesia of skin: Secondary | ICD-10-CM | POA: Insufficient documentation

## 2018-10-20 NOTE — Progress Notes (Signed)
Troy Little - 68 y.o. male MRN [id number]  Date of birth: [birth year]  SUBJECTIVE:  Including CC & ROS.  Chief Complaint  Patient presents with  . Knee Pain    right x1-2 months    Troy Little is a 68 y.o. male that is presenting with left lateral knee numbness.  This is ongoing for 1 to 2 months.  He feels like he had a snapping sensation on the lateral aspect of his knee and then developed this numbness.  It is only localized to the lateral aspect of the patella and joint line.  Denies any pain today.  Denies any swelling or mechanical symptoms.  No history of surgery or problems in the left knee.   Review of Systems  Constitutional: Negative for fever.  HENT: Negative for congestion.   Respiratory: Negative for cough.   Cardiovascular: Negative for chest pain.  Gastrointestinal: Negative for abdominal pain.  Musculoskeletal: Negative for back pain.  Skin: Negative for color change.  Neurological: Positive for numbness.  Hematological: Negative for adenopathy.  Psychiatric/Behavioral: Negative for agitation.    HISTORY: Past Medical, Surgical, Social, and Family History Reviewed & Updated per EMR.   Pertinent Historical Findings include:  Past Medical History:  Diagnosis Date  . Agoraphobia   . Anxiety   . Diabetes mellitus without complication ([place])   . Essential Tremor   . Hypercholesteremia   . MDD (major depressive disorder)   . Psychosis [facility])     Past Surgical History:  Procedure Laterality Date  . CHOLECYSTECTOMY      Allergies  Allergen Reactions  . Dye Fdc Red [Red Dye]   . Prozac [Fluoxetine Hcl]     agitation    No family history on file.   Social History   Socioeconomic History  . Marital status: Married    Spouse name: Not on file  . Number of children: Not on file  . Years of education: Not on file  . Highest education level: Not on file  Occupational History  . Not on file  Social Needs  . Financial resource strain: Not on  file  . Food insecurity:    Worry: Not on file    Inability: Not on file  . Transportation needs:    Medical: Not on file    Non-medical: Not on file  Tobacco Use  . Smoking status: Current Every Day Smoker    Packs/day: 0.50    Types: Cigarettes  . Smokeless tobacco: Never Used  Substance and Sexual Activity  . Alcohol use: Yes  . Drug use: No    Types: Cocaine    Comment: denies  . Sexual activity: Not on file  Lifestyle  . Physical activity:    Days per week: Not on file    Minutes per session: Not on file  . Stress: Not on file  Relationships  . Social connections:    Talks on phone: Not on file    Gets together: Not on file    Attends religious service: Not on file    Active member of club or organization: Not on file    Attends meetings of clubs or organizations: Not on file    Relationship status: Not on file  . Intimate partner violence:    Fear of current or ex partner: Not on file    Emotionally abused: Not on file    Physically abused: Not on file    Forced sexual activity: Not on file  Other Topics Concern  .  Not on file  Social History Narrative  . Not on file     PHYSICAL EXAM:  VS: BP 130/68   Pulse 63   Ht 5\' 8"  (1.727 m)   Wt 149 lb (67.6 kg)   SpO2 97%   BMI 22.66 kg/m  Physical Exam Gen: NAD, alert, cooperative with exam, well-appearing ENT: normal lips, normal nasal mucosa,  Eye: normal EOM, normal conjunctiva and lids CV:  no edema, +2 pedal pulses   Resp: no accessory muscle use, non-labored,  Skin: no rashes, no areas of induration  Neuro: normal tone, normal sensation to touch Psych:  normal insight, alert and oriented MSK:  Left knee:  No effusion  Normal ROM  No TTP of the medial or lateral joint line  Negative McMurray's test  No pain with patellar grind  Neurovascularly intact   Limited ultrasound: left knee:  No effusion  Normal appearing lateral and medial joint line  Normal appearing quad and patellar tendon    Summary: normal exam   Ultrasound and interpretation by Clearance Coots, MD          ASSESSMENT & PLAN:   Numbness Has numbness on the lateral aspect of the patella. No pain today. No mechanical symptoms or changes observed on [place]. Possible for nerve irritation that is localized to the knee  - counseled on supportive care and reassurance  - if no improvement consider imaging or gabapentin.

## 2018-10-20 NOTE — Assessment & Plan Note (Signed)
Has numbness on the lateral aspect of the patella. No pain today. No mechanical symptoms or changes observed on [place]. Possible for nerve irritation that is localized to the knee  - counseled on supportive care and reassurance  - if no improvement consider imaging or gabapentin.

## 2018-10-20 NOTE — Patient Instructions (Signed)
Nice to meet you  Please try the aspercreame with lidocaine  You can try compression on the knee  Please see me back in 4-6 weeks if no better

## 2018-10-24 ENCOUNTER — Ambulatory Visit
Admission: RE | Admit: 2018-10-24 | Discharge: 2018-10-24 | Disposition: A | Discharge status: Home / Self Care | Payer: Medicare HMO | Source: Ambulatory Visit | Attending: Family | Admitting: Family

## 2018-10-24 DIAGNOSIS — M542 Cervicalgia: Secondary | ICD-10-CM

## 2018-10-24 DIAGNOSIS — M4802 Spinal stenosis, cervical region: Secondary | ICD-10-CM | POA: Diagnosis not present

## 2018-10-25 ENCOUNTER — Encounter: Payer: Medicare HMO | Admitting: Acute Care

## 2018-10-25 ENCOUNTER — Inpatient Hospital Stay: Admission: RE | Admit: 2018-10-25 | Payer: Medicare HMO | Source: Ambulatory Visit

## 2018-10-25 ENCOUNTER — Telehealth: Payer: Self-pay | Admitting: Acute Care

## 2018-10-26 NOTE — Telephone Encounter (Signed)
Spoke with pt and rescheduled [facility] 11/15/18 10:30 CT will be rescheduled  Nothing further needed

## 2018-10-30 ENCOUNTER — Other Ambulatory Visit: Payer: Self-pay | Admitting: Family

## 2018-10-30 DIAGNOSIS — G8929 Other chronic pain: Secondary | ICD-10-CM

## 2018-10-30 DIAGNOSIS — M542 Cervicalgia: Principal | ICD-10-CM

## 2018-11-01 ENCOUNTER — Ambulatory Visit: Payer: Medicare HMO | Admitting: Family

## 2018-11-01 NOTE — Telephone Encounter (Signed)
Dr Hilarie Fredrickson has reviewed [place] records and indicates "patient seeing GI at [facility], as recently as 08/31/18. Recommend he continue care there."

## 2018-11-10 ENCOUNTER — Encounter: Payer: Self-pay | Admitting: Family

## 2018-11-10 ENCOUNTER — Ambulatory Visit (INDEPENDENT_AMBULATORY_CARE_PROVIDER_SITE_OTHER): Payer: Medicare HMO | Admitting: Family

## 2018-11-10 VITALS — BP 128/74 | HR 55 | Temp 98.4°F | Ht 68.0 in | Wt 155.0 lb

## 2018-11-10 DIAGNOSIS — K449 Diaphragmatic hernia without obstruction or gangrene: Secondary | ICD-10-CM

## 2018-11-10 DIAGNOSIS — G8929 Other chronic pain: Secondary | ICD-10-CM

## 2018-11-10 DIAGNOSIS — I10 Essential (primary) hypertension: Secondary | ICD-10-CM | POA: Diagnosis not present

## 2018-11-10 DIAGNOSIS — K219 Gastro-esophageal reflux disease without esophagitis: Secondary | ICD-10-CM | POA: Diagnosis not present

## 2018-11-10 DIAGNOSIS — E782 Mixed hyperlipidemia: Secondary | ICD-10-CM | POA: Diagnosis not present

## 2018-11-10 DIAGNOSIS — G250 Essential tremor: Secondary | ICD-10-CM | POA: Diagnosis not present

## 2018-11-10 DIAGNOSIS — M542 Cervicalgia: Secondary | ICD-10-CM | POA: Diagnosis not present

## 2018-11-10 DIAGNOSIS — E1159 Type 2 diabetes mellitus with other circulatory complications: Secondary | ICD-10-CM

## 2018-11-10 MED ORDER — METFORMIN HCL 500 MG PO TABS: 500.0000 mg | ORAL_TABLET | Freq: Every day | ORAL | Status: AC

## 2018-11-10 MED ORDER — PROPRANOLOL HCL 10 MG PO TABS: ORAL_TABLET | ORAL | Status: AC

## 2018-11-10 MED ORDER — PRIMIDONE 50 MG PO TABS: 50.0000 mg | ORAL_TABLET | Freq: Two times a day (BID) | ORAL | Status: AC

## 2018-11-10 NOTE — Progress Notes (Signed)
Troy Little is a 68 y.o. male with the following history as recorded in EpicCare:  Patient Active Problem List   Diagnosis Date Noted  . Numbness 10/20/2018  . Hepatitis C antibody test positive 11/03/2015  . Diastolic dysfunction 94/17/4081  . Hyperlipidemia 11/01/2015  . Chronic neck pain 11/01/2015  . Leg weakness   . TIA (transient ischemic attack) 10/31/2015  . Diabetes mellitus ([place]) 10/31/2015  . Anxiety and depression 10/31/2015  . Status post cholecystectomy 10/31/2015  . Tobacco use 10/31/2015  . Essential tremor 10/31/2015    Current Outpatient Medications  Medication Sig Dispense Refill  . aspirin EC 81 MG tablet Take 81 mg by mouth daily.    Marland Kitchen glucose blood test strip 1 each by Other route as needed for other. Use as instructed    . HYDROcodone-acetaminophen (NORCO/VICODIN) 5-325 MG tablet Take 1 tablet by mouth every 6 (six) hours as needed for moderate pain.    Marland Kitchen lisinopril (PRINIVIL,ZESTRIL) 40 MG tablet Take 40 mg by mouth daily.    . metFORMIN (GLUCOPHAGE) 500 MG tablet Take 1 tablet (500 mg total) by mouth daily with breakfast. Per patient, he takes 1/4 tablet per day "when he needs it"    . Omega-3 Fatty Acids (FISH OIL) 1000 MG CAPS Take by mouth.    . primidone (MYSOLINE) 50 MG tablet Take 1 tablet (50 mg total) by mouth 2 (two) times daily. Per patient, takes  1 1/2 tablet in the am and 1 tablet in the afternoon    . propranolol (INDERAL) 10 MG tablet Patient takes 1 1/2 tablet in the am, 1 in the afternoon, 1 in the evening    . simvastatin (ZOCOR) 20 MG tablet Take 20 mg by mouth daily.     No current facility-administered medications for this visit.     Allergies: Dye fdc red [red dye] and Prozac [fluoxetine hcl]  Past Medical History:  Diagnosis Date  . Agoraphobia   . Anxiety   . Diabetes mellitus without complication ([place])   . Essential Tremor   . Hypercholesteremia   . MDD (major depressive disorder)   . Psychosis [facility])     Past Surgical  History:  Procedure Laterality Date  . CHOLECYSTECTOMY      History reviewed. No pertinent family history.  Social History   Tobacco Use  . Smoking status: Current Every Day Smoker    Packs/day: 0.50    Types: Cigarettes  . Smokeless tobacco: Never Used  Substance Use Topics  . Alcohol use: Yes    Subjective:  Follow-up on chronic care needs- Is scheduled for lung cancer screening CT and has been contacted by neurosurgery to review recent cervical MRI; [facility]; Requesting for referral to be re-done to GI/ wants to transfer care from [place]- does not want just a second opinion; is concerned that he needs endoscopy as continues to struggle with persisting abdominal pain; Denies any concerns for low blood sugar; Takes 125 mg of Metformin 2x per day; last Hgba1c at 7.2; working very hard on healthy eating; Denies any chest pain, shortness of breath, blurred vision or headache   Objective:  Vitals:   11/10/18 1336  BP: 128/74  Pulse: (!) 55  Temp: 98.4 F (36.9 C)  TempSrc: Oral  SpO2: 98%  Weight: 155 lb 0.6 oz (70.3 kg)  Height: 5\' 8"  (1.727 m)    General: Well developed, well nourished, in no acute distress  Skin : Warm  and dry.  Head: Normocephalic and atraumatic  Lungs: Respirations unlabored; clear to auscultation bilaterally without wheeze, rales, rhonchi  CVS exam: normal rate and regular rhythm.  Neurologic: Alert and oriented; speech intact; face symmetrical; moves all extremities well; CNII-XII intact without focal deficit   Assessment:  1. Gastroesophageal reflux disease, esophagitis presence not specified   2. Hiatal hernia   3. Essential tremor   4. Chronic neck pain   5. Mixed hyperlipidemia   6. Type 2 diabetes mellitus with other circulatory complication, without long-term current use of insulin ([place])   7. Essential hypertension     Plan:  1. & 2. Have put in 2nd request for GI eval- clarified that he  wants to transfer care/ not 2nd opinion;  3. Continue with neurology at [facility]; 4. Plan to see neurosurgeon as discussed; 5. Labs were checked in January 2020; continue Zocor; 6. Stable; Hagba1c at 7.2; re-check in 6 months; stressed need to quit smoking. 7. Stable; refill updated;   Return in about 6 months (around 05/11/2019).  Orders Placed This Encounter  Procedures  . Ambulatory referral to Gastroenterology    Referral Priority:   Routine    Referral Type:   Consultation    Referral Reason:   Specialty Services Required    Number of Visits Requested:   1    Requested Prescriptions   Signed Prescriptions Disp Refills  . metFORMIN (GLUCOPHAGE) 500 MG tablet      Sig: Take 1 tablet (500 mg total) by mouth daily with breakfast. Per patient, he takes 1/4 tablet per day "when he needs it"  . propranolol (INDERAL) 10 MG tablet      Sig: Patient takes 1 1/2 tablet in the am, 1 in the afternoon, 1 in the evening  . primidone (MYSOLINE) 50 MG tablet      Sig: Take 1 tablet (50 mg total) by mouth 2 (two) times daily. Per patient, takes  1 1/2 tablet in the am and 1 tablet in the afternoon

## 2018-11-15 ENCOUNTER — Ambulatory Visit (INDEPENDENT_AMBULATORY_CARE_PROVIDER_SITE_OTHER)
Admission: RE | Admit: 2018-11-15 | Discharge: 2018-11-15 | Disposition: A | Discharge status: Home / Self Care | Payer: Medicare HMO | Source: Ambulatory Visit | Attending: Acute Care | Admitting: Acute Care

## 2018-11-15 ENCOUNTER — Encounter: Payer: Self-pay | Admitting: Acute Care

## 2018-11-15 ENCOUNTER — Ambulatory Visit (INDEPENDENT_AMBULATORY_CARE_PROVIDER_SITE_OTHER): Payer: Medicare HMO | Admitting: Acute Care

## 2018-11-15 VITALS — BP 138/72 | HR 54 | Ht 68.0 in | Wt 151.6 lb

## 2018-11-15 DIAGNOSIS — Z122 Encounter for screening for malignant neoplasm of respiratory organs: Secondary | ICD-10-CM

## 2018-11-15 DIAGNOSIS — F17210 Nicotine dependence, cigarettes, uncomplicated: Secondary | ICD-10-CM

## 2018-11-15 DIAGNOSIS — Z87891 Personal history of nicotine dependence: Secondary | ICD-10-CM | POA: Diagnosis not present

## 2018-11-15 NOTE — Progress Notes (Signed)
Shared Decision Making Visit Lung Cancer Screening Program [id number])   Eligibility:  Age 68 y.o.  Pack Years Smoking History Calculation 35 pack year smoking hisotry (# packs/per year x # years smoked)  Recent History of coughing up blood  no  Unexplained weight loss? no ( >Than 15 pounds within the last 6 months )  Prior History Lung / other cancer no (Diagnosis within the last 5 years already requiring surveillance chest CT Scans).  Smoking Status Current Smoker  Former Smokers: Years since quit: NA  Quit Date: NA  Visit Components:  Discussion included one or more decision making aids. yes  Discussion included risk/benefits of screening. yes  Discussion included potential follow up diagnostic testing for abnormal scans. yes  Discussion included meaning and risk of over diagnosis. yes  Discussion included meaning and risk of False Positives. yes  Discussion included meaning of total radiation exposure. yes  Counseling Included:  Importance of adherence to annual lung cancer LDCT screening. yes  Impact of comorbidities on ability to participate in the program. yes  Ability and willingness to under diagnostic treatment. yes  Smoking Cessation Counseling:  Current Smokers:   Discussed importance of smoking cessation. yes  Information about tobacco cessation classes and interventions provided to patient. yes  Patient provided with "ticket" for LDCT Scan. yes  Symptomatic Patient. no  Counseling  Diagnosis Code: Tobacco Use Z72.0  Asymptomatic Patient yes  Counseling (Intermediate counseling: > three minutes counseling) [id number]  Former Smokers:   Discussed the importance of maintaining cigarette abstinence. yes  Diagnosis Code: Personal History of Nicotine Dependence. X10.626  Information about tobacco cessation classes and interventions provided to patient. Yes  Patient provided with "ticket" for LDCT Scan. yes  Written Order for Lung Cancer  Screening with LDCT placed in Epic. Yes (CT Chest Lung Cancer Screening Low Dose W/O CM) [id number] Z12.2-Screening of respiratory organs Z87.891-Personal history of nicotine dependence  I have spent 25 minutes of face to face time with Troy Little discussing the risks and benefits of lung cancer screening. We viewed a power point together that explained in detail the above noted topics. We paused at intervals to allow for questions to be asked and answered to ensure understanding.We discussed that the single most powerful action that he can take to decrease his risk of developing lung cancer is to quit smoking. We discussed whether or not he is ready to commit to setting a quit date. He stated that h is planning on quitting after he finishes his current pack of cigarettes. There is half a pack left.We discussed options for tools to aid in quitting smoking including nicotine replacement therapy, non-nicotine medications, support groups, Quit Smart classes, and behavior modification. We discussed that often times setting smaller, more achievable goals, such as eliminating 1 cigarette a day for a week and then 2 cigarettes a day for a week can be helpful in slowly decreasing the number of cigarettes smoked. This allows for a sense of accomplishment as well as providing a clinical benefit. I gave him the " Be Stronger Than Your Excuses" card with contact information for community resources, classes, free nicotine replacement therapy, and access to mobile apps, text messaging, and on-line smoking cessation help. I have also given him my card and contact information in the event he needs to contact me. We discussed the time and location of the scan, and that either Doroteo Glassman RN or I will call with the results within 24-48 hours of receiving them. I have offered  him  a copy of the power point we viewed  as a resource in the event they need reinforcement of the concepts we discussed today in the office. The patient  verbalized understanding of all of  the above and had no further questions upon leaving the office. They have my contact information in the event they have any further questions.  I spent 3 minutes counseling on smoking cessation and the health risks of continued tobacco abuse.  I explained to the patient that there has been a high incidence of coronary artery disease noted on these exams. I explained that this is a non-gated exam therefore degree or severity cannot be determined. This patient is  currently on statin therapy. I have asked the patient to follow-up with their PCP regarding any incidental finding of coronary artery disease and management with diet or medication as their PCP  feels is clinically indicated. The patient verbalized understanding of the above and had no further questions upon completion of the visit.      Magdalen Spatz, NP 11/15/2018 10:32 AM

## 2018-11-16 ENCOUNTER — Encounter: Payer: Self-pay | Admitting: Family

## 2018-11-20 ENCOUNTER — Other Ambulatory Visit: Payer: Self-pay | Admitting: Acute Care

## 2018-11-20 DIAGNOSIS — F17210 Nicotine dependence, cigarettes, uncomplicated: Principal | ICD-10-CM

## 2018-11-20 DIAGNOSIS — Z122 Encounter for screening for malignant neoplasm of respiratory organs: Secondary | ICD-10-CM

## 2018-11-20 DIAGNOSIS — Z87891 Personal history of nicotine dependence: Secondary | ICD-10-CM

## 2019-01-19 ENCOUNTER — Ambulatory Visit: Payer: Self-pay | Admitting: *Deleted

## 2019-01-19 NOTE — Telephone Encounter (Signed)
Pt called with having an elevated b/p that started yesterday. He did not remember the exact but thought it was 190's over 80's.  He has a history of HTN and has medications prescribed for him but does not take his medications every day.  Today before he went for his walk, his b/p was 182/78 and after his walk it was 176/82  HR 61. He has denied any symptoms: such as headache, chest pain, blurred vision or feeling weak  Advised that if he starts having those symptoms with an elevation, then he needs to go to an ED. Pt voiced understanding.  He also was advised of having a virtual visit with verbal understanding. He will keep track of his readings and try taking his b/p medications over the weekend and notify his pcp. Routing to [facility] at [facility] for review and an appointment.  Reason for Disposition . Systolic BP  >= 021 OR Diastolic >= 117  Answer Assessment - Initial Assessment Questions 1. BLOOD PRESSURE: "What is the blood pressure?" "Did you take at least two measurements 5 minutes apart?"     182/78 and after walking 176//82 2. ONSET: "When did you take your blood pressure?"      now 3. HOW: "How did you obtain the blood pressure?" (e.g., visiting nurse, automatic home BP monitor)     Automatic home BP monitor 4. HISTORY: "Do you have a history of high blood pressure?"     yes 5. MEDICATIONS: "Are you taking any medications for blood pressure?" "Have you missed any doses recently?"     Only takes it as needed 6. OTHER SYMPTOMS: "Do you have any symptoms?" (e.g., headache, chest pain, blurred vision, difficulty breathing, weakness)     no 7. PREGNANCY: "Is there any chance you are pregnant?" "When was your last menstrual period?"     n/a  Protocols used: HIGH BLOOD PRESSURE-A-AH

## 2019-01-22 ENCOUNTER — Encounter: Payer: Self-pay | Admitting: Internal Medicine

## 2019-01-22 ENCOUNTER — Ambulatory Visit (INDEPENDENT_AMBULATORY_CARE_PROVIDER_SITE_OTHER): Payer: Medicare HMO | Admitting: Internal Medicine

## 2019-01-22 DIAGNOSIS — I10 Essential (primary) hypertension: Secondary | ICD-10-CM

## 2019-01-22 DIAGNOSIS — E1159 Type 2 diabetes mellitus with other circulatory complications: Secondary | ICD-10-CM | POA: Insufficient documentation

## 2019-01-22 NOTE — Telephone Encounter (Signed)
Noted thanks °

## 2019-01-22 NOTE — Assessment & Plan Note (Signed)
Educated that lisinopril is meant to be taken daily and not when he feels and that there can be problems from prn usage of this. Take daily for 1-2 weeks and monitor BP. Call back with readings and adjust as needed. Talked to him about low sodium and exercise to help as well.

## 2019-01-22 NOTE — Telephone Encounter (Signed)
Virtual scheduled today  

## 2019-01-22 NOTE — Progress Notes (Signed)
Virtual Visit via Video Note  I connected with Troy Little on 01/22/19 at 12:40 PM EDT by a video enabled telemedicine application and verified that I am speaking with the correct person using two identifiers.  The patient and the provider were at separate locations throughout the entire encounter.   I discussed the limitations of evaluation and management by telemedicine and the availability of in person appointments. The patient expressed understanding and agreed to proceed.  History of Present Illness: The patient is a 68 y.o. man with visit for blood pressure problems. Started in the last week or so. Denies feeling sick otherwise. Has been taking propranolol as on his med list. Has only been taking lisinopril when BP is high or running high. This has been typically once per month but in the last week he has taken for the last 3 days in a row. Denies fevers or chills or SOB. Does have headaches which are chronic from neck pain and overall unchanged. He did lose a filling in the last week or so which is not hurting at this time. Overall it is stable. Has tried nothing. BP running mostly 170-180/70-80s. Is a smoker and is still smoking.  Observations/Objective: Appearance: normal, breathing appears normal, casual grooming, abdomen does not appear distended, throat normal, mental status is A and O times 3, PERRLA, EOM intact  BP readings at home 2am 183/76,   Assessment and Plan: See problem oriented charting  Follow Up Instructions: take lisinopril 40 mg daily for the next 1-2 weeks and call back with readings, if he gets chest pains or SOB seek care at [facility]  I discussed the assessment and treatment plan with the patient. The patient was provided an opportunity to ask questions and all were answered. The patient agreed with the plan and demonstrated an understanding of the instructions.   The patient was advised to call back or seek an in-person evaluation if the symptoms worsen or  if the condition fails to improve as anticipated.  Hoyt Koch, MD

## 2019-03-12 ENCOUNTER — Other Ambulatory Visit: Payer: Self-pay

## 2019-03-12 ENCOUNTER — Emergency Department (HOSPITAL_COMMUNITY)
Admission: EM | Admit: 2019-03-12 | Discharge: 2019-03-13 | Disposition: A | Discharge status: Home / Self Care | Payer: No Typology Code available for payment source | Attending: Emergency Medicine | Admitting: Emergency Medicine

## 2019-03-12 ENCOUNTER — Encounter (HOSPITAL_COMMUNITY): Payer: Self-pay | Admitting: Emergency Medicine

## 2019-03-12 DIAGNOSIS — I10 Essential (primary) hypertension: Secondary | ICD-10-CM | POA: Diagnosis not present

## 2019-03-12 DIAGNOSIS — Z5321 Procedure and treatment not carried out due to patient leaving prior to being seen by health care provider: Secondary | ICD-10-CM | POA: Diagnosis not present

## 2019-03-12 DIAGNOSIS — M549 Dorsalgia, unspecified: Secondary | ICD-10-CM | POA: Diagnosis not present

## 2019-03-12 NOTE — ED Triage Notes (Signed)
Pt reports that his blood pressure was high at home 194/94.  States he put salt on his dinner which caused the increase. He took his bp medication and it has since decreased.  States he has been taking his BP several times a day due to the increased back pain.

## 2019-03-13 NOTE — ED Notes (Signed)
Pt called for vitals check with no answer

## 2019-04-13 ENCOUNTER — Ambulatory Visit: Payer: Medicare HMO | Admitting: Family

## 2019-05-08 ENCOUNTER — Other Ambulatory Visit (INDEPENDENT_AMBULATORY_CARE_PROVIDER_SITE_OTHER): Payer: Medicare HMO

## 2019-05-08 ENCOUNTER — Ambulatory Visit (INDEPENDENT_AMBULATORY_CARE_PROVIDER_SITE_OTHER): Payer: Medicare HMO | Admitting: Family

## 2019-05-08 ENCOUNTER — Other Ambulatory Visit: Payer: Self-pay

## 2019-05-08 ENCOUNTER — Encounter: Payer: Self-pay | Admitting: Family

## 2019-05-08 VITALS — BP 130/76 | HR 58 | Temp 97.9°F | Ht 68.0 in | Wt 142.4 lb

## 2019-05-08 DIAGNOSIS — E538 Deficiency of other specified B group vitamins: Secondary | ICD-10-CM | POA: Diagnosis not present

## 2019-05-08 DIAGNOSIS — E559 Vitamin D deficiency, unspecified: Secondary | ICD-10-CM

## 2019-05-08 DIAGNOSIS — R634 Abnormal weight loss: Secondary | ICD-10-CM | POA: Diagnosis not present

## 2019-05-08 DIAGNOSIS — E1159 Type 2 diabetes mellitus with other circulatory complications: Secondary | ICD-10-CM

## 2019-05-08 DIAGNOSIS — I10 Essential (primary) hypertension: Secondary | ICD-10-CM | POA: Diagnosis not present

## 2019-05-08 LAB — VITAMIN D 25 HYDROXY (VIT D DEFICIENCY, FRACTURES): VITD: 26.61 ng/mL — ABNORMAL LOW (ref 30.00–100.00)

## 2019-05-08 LAB — CBC WITH DIFFERENTIAL/PLATELET
Basophils Absolute: 0.1 10*3/uL (ref 0.0–0.1)
Basophils Relative: 1 % (ref 0.0–3.0)
Eosinophils Absolute: 0.2 10*3/uL (ref 0.0–0.7)
Eosinophils Relative: 3.6 % (ref 0.0–5.0)
HCT: 44.7 % (ref 39.0–52.0)
Hemoglobin: 14.6 g/dL (ref 13.0–17.0)
Lymphocytes Relative: 39.7 % (ref 12.0–46.0)
Lymphs Abs: 2.2 10*3/uL (ref 0.7–4.0)
MCHC: 32.6 g/dL (ref 30.0–36.0)
MCV: 81.2 fl (ref 78.0–100.0)
Monocytes Absolute: 0.5 10*3/uL (ref 0.1–1.0)
Monocytes Relative: 8.3 % (ref 3.0–12.0)
Neutro Abs: 2.6 10*3/uL (ref 1.4–7.7)
Neutrophils Relative %: 47.4 % (ref 43.0–77.0)
Platelets: 169 10*3/uL (ref 150.0–400.0)
RBC: 5.5 Mil/uL (ref 4.22–5.81)
RDW: 13.8 % (ref 11.5–15.5)
WBC: 5.5 10*3/uL (ref 4.0–10.5)

## 2019-05-08 LAB — COMPREHENSIVE METABOLIC PANEL
ALT: 23 U/L (ref 0–53)
AST: 20 U/L (ref 0–37)
Albumin: 4.8 g/dL (ref 3.5–5.2)
Alkaline Phosphatase: 48 U/L (ref 39–117)
BUN: 12 mg/dL (ref 6–23)
CO2: 30 mEq/L (ref 19–32)
Calcium: 9.8 mg/dL (ref 8.4–10.5)
Chloride: 103 mEq/L (ref 96–112)
Creatinine, Ser: 0.97 mg/dL (ref 0.40–1.50)
GFR: 93.78 mL/min (ref >60.00)
Glucose, Bld: 133 mg/dL — ABNORMAL HIGH (ref 70–99)
Potassium: 4 mEq/L (ref 3.5–5.1)
Sodium: 140 mEq/L (ref 135–145)
Total Bilirubin: 0.5 mg/dL (ref 0.2–1.2)
Total Protein: 7.5 g/dL (ref 6.0–8.3)

## 2019-05-08 LAB — LIPID PANEL
Cholesterol: 129 mg/dL (ref 0–200)
HDL: 48.4 mg/dL (ref >39.00)
LDL Cholesterol: 47 mg/dL (ref 0–99)
NonHDL: 80.38
Total CHOL/HDL Ratio: 3
Triglycerides: 167 mg/dL — ABNORMAL HIGH (ref 0.0–149.0)
VLDL: 33.4 mg/dL (ref 0.0–40.0)

## 2019-05-08 LAB — LIPASE: Lipase: 20 U/L (ref 11.0–59.0)

## 2019-05-08 LAB — AMYLASE: Amylase: 41 U/L (ref 27–131)

## 2019-05-08 LAB — TSH: TSH: 1 u[IU]/mL (ref 0.35–4.50)

## 2019-05-08 LAB — HEMOGLOBIN A1C: Hgb A1c MFr Bld: 6.7 % — ABNORMAL HIGH (ref 4.6–6.5)

## 2019-05-08 LAB — VITAMIN B12: Vitamin B-12: 459 pg/mL (ref 211–911)

## 2019-05-08 MED ORDER — LISINOPRIL 40 MG PO TABS: 40.0000 mg | ORAL_TABLET | Freq: Every day | ORAL | 1 refills | Status: DC

## 2019-05-08 MED ORDER — GLUCOSE BLOOD VI STRP: 1.0000 | ORAL_STRIP | Freq: Three times a day (TID) | 6 refills | Status: DC | PRN

## 2019-05-08 NOTE — Progress Notes (Signed)
Troy Little is a 68 y.o. male with the following history as recorded in EpicCare:  Patient Active Problem List   Diagnosis Date Noted  . Essential hypertension 01/22/2019  . Numbness 10/20/2018  . Hepatitis C antibody test positive 11/03/2015  . Diastolic dysfunction 19/75/8832  . Hyperlipidemia 11/01/2015  . Chronic neck pain 11/01/2015  . Leg weakness   . TIA (transient ischemic attack) 10/31/2015  . Diabetes mellitus ([place]) 10/31/2015  . Anxiety and depression 10/31/2015  . Status post cholecystectomy 10/31/2015  . Tobacco use 10/31/2015  . Essential tremor 10/31/2015    Current Outpatient Medications  Medication Sig Dispense Refill  . aspirin EC 81 MG tablet Take 81 mg by mouth daily.    Marland Kitchen glucose blood test strip 1 each by Other route 3 (three) times daily as needed for other. Use as instructed 100 each 6  . HYDROcodone-acetaminophen (NORCO/VICODIN) 5-325 MG tablet Take 1 tablet by mouth every 6 (six) hours as needed for moderate pain.    Marland Kitchen lisinopril (ZESTRIL) 40 MG tablet Take 1 tablet (40 mg total) by mouth daily. 90 tablet 1  . metFORMIN (GLUCOPHAGE) 500 MG tablet Take 1 tablet (500 mg total) by mouth daily with breakfast. Per patient, he takes 1/4 tablet per day "when he needs it"    . Omega-3 Fatty Acids (FISH OIL) 1000 MG CAPS Take by mouth.    . primidone (MYSOLINE) 50 MG tablet Take 1 tablet (50 mg total) by mouth 2 (two) times daily. Per patient, takes  1 1/2 tablet in the am and 1 tablet in the afternoon    . propranolol (INDERAL) 10 MG tablet Patient takes 1 1/2 tablet in the am, 1 in the afternoon, 1 in the evening    . simvastatin (ZOCOR) 20 MG tablet Take 20 mg by mouth daily.     No current facility-administered medications for this visit.     Allergies: Dye fdc red [red dye] and Prozac [fluoxetine hcl]  Past Medical History:  Diagnosis Date  . Agoraphobia   . Anxiety   . Diabetes mellitus without complication ([place])   . Essential Tremor   .  Hypercholesteremia   . MDD (major depressive disorder)   . Psychosis [facility])     Past Surgical History:  Procedure Laterality Date  . CHOLECYSTECTOMY      History reviewed. No pertinent family history.  Social History   Tobacco Use  . Smoking status: Current Every Day Smoker    Packs/day: 0.75    Years: 47.00    Pack years: 35.25    Types: Cigarettes  . Smokeless tobacco: Never Used  . Tobacco comment: plans on quitting after his current pack is completed.  Substance Use Topics  . Alcohol use: Yes    Subjective:  6 month follow-up on chronic care needs including hypertension and Type 2 diabetes; is not taking his Lisinopril regularly- only when is pressure "is high." Takes Propanolol daily; checks his blood sugar 3 x per day- takes 1/4 Metformin when needed;  Is concerned to see he has lost 10 pounds since OV in February; admits not trying to lose weight; denies any night sweats; lung cancer screen done in February was normal; no changes in bowel movements;    Objective:  Vitals:   05/08/19 1058  BP: 130/76  Pulse: (!) 58  Temp: 97.9 F (36.6 C)  TempSrc: Oral  SpO2: 98%  Weight: 142 lb 6.4 oz (64.6 kg)  Height: '5\' 8"'  (1.727 m)  General: Well developed, well nourished, in no acute distress  Skin : Warm and dry.  Head: Normocephalic and atraumatic  Eyes: Sclera and conjunctiva clear; pupils round and reactive to light; extraocular movements intact  Ears: External normal; canals clear; tympanic membranes normal  Oropharynx: Pink, supple. No suspicious lesions  Neck: Supple without thyromegaly, adenopathy  Lungs: Respirations unlabored; clear to auscultation bilaterally without wheeze, rales, rhonchi  CVS exam: normal rate and regular rhythm.  Neurologic: Alert and oriented; speech intact; face symmetrical; moves all extremities well; CNII-XII intact without focal deficit   Assessment:  1. Type 2 diabetes mellitus with other circulatory complication, without long-term  current use of insulin ([place])   2. Weight loss   3. Unexplained weight loss   4. B12 deficiency   5. Vitamin D deficiency   6. Essential hypertension     Plan:  1. Check Hgba1c today; refill updated on test strips; 2. ? Etiology; will not repeat CXR as lung cancer CT done in February; will need to consider abd/ pelvic CT and GI evaluation. Follow-up to be determined. 3. Check B12 level; 4. Check Vitamin D level;   No follow-ups on file.  Orders Placed This Encounter  Procedures  . CBC w/Diff    Standing Status:   Future    Number of Occurrences:   1    Standing Expiration Date:   05/07/2020  . Comp Met (CMET)    Standing Status:   Future    Number of Occurrences:   1    Standing Expiration Date:   05/07/2020  . Lipid panel    Standing Status:   Future    Number of Occurrences:   1    Standing Expiration Date:   05/07/2020  . TSH    Standing Status:   Future    Number of Occurrences:   1    Standing Expiration Date:   05/07/2020  . HgB A1c    Standing Status:   Future    Number of Occurrences:   1    Standing Expiration Date:   05/07/2020  . B12    Standing Status:   Future    Number of Occurrences:   1    Standing Expiration Date:   05/07/2020  . Vitamin D (25 hydroxy)    Standing Status:   Future    Number of Occurrences:   1    Standing Expiration Date:   05/07/2020  . Amylase    Standing Status:   Future    Number of Occurrences:   1    Standing Expiration Date:   05/07/2020  . Lipase    Standing Status:   Future    Number of Occurrences:   1    Standing Expiration Date:   05/07/2020    Requested Prescriptions   Signed Prescriptions Disp Refills  . lisinopril (ZESTRIL) 40 MG tablet 90 tablet 1    Sig: Take 1 tablet (40 mg total) by mouth daily.  Marland Kitchen glucose blood test strip 100 each 6    Sig: 1 each by Other route 3 (three) times daily as needed for other. Use as instructed

## 2019-05-09 ENCOUNTER — Other Ambulatory Visit: Payer: Self-pay | Admitting: Family

## 2019-05-09 DIAGNOSIS — R634 Abnormal weight loss: Secondary | ICD-10-CM

## 2019-05-09 MED ORDER — VITAMIN D (ERGOCALCIFEROL) 1.25 MG (50000 UNIT) PO CAPS: 50000.0000 [IU] | ORAL_CAPSULE | ORAL | 0 refills | Status: AC

## 2019-05-11 ENCOUNTER — Ambulatory Visit: Payer: Self-pay | Admitting: *Deleted

## 2019-05-11 ENCOUNTER — Telehealth: Payer: Self-pay | Admitting: Family

## 2019-05-11 NOTE — Telephone Encounter (Signed)
His Precision Extra strips are not available at [facility]. [facility]

## 2019-05-11 NOTE — Telephone Encounter (Addendum)
Pt called with having a b/p reading of 203/87. He felt a slight headache and decided to check his b/p. He denies dizziness, weakness, blurred vision, chest pain or shortness of breath. He stated it has been 2 days since he last took his lisinopril. He stated his b/p sometimes run low and will not take the medication. On the 80 th of August his b/p was 80/50, denied any symptoms.  And on the 16 th it was 88/56. He rechecked his b/p just now and it is 172/85 Hr 55. Denies being stressed. He is going to take his lisinopril and go for a walk and recheck his b/p in about an hour and call back.  advised that if needed he could do a virtual appointment with a provider in the morning. But also if he is experiencing  symptoms with an elevated b/p, he needs to be seen at the hospital. He voiced understanding and will cal back with his readings. Routing to [facility] at [place] or review.   Pt did not return call with blood pressure readings.   Reason for Disposition . Systolic BP  >= 0000000 OR Diastolic >= [id number]  Answer Assessment - Initial Assessment Questions 1. BLOOD PRESSURE: "What is the blood pressure?" "Did you take at least two measurements 5 minutes apart?"     203/87  172/85 HR 55 2. ONSET: "When did you take your blood pressure?"     Today this afternoon 3. HOW: "How did you obtain the blood pressure?" (e.g., visiting nurse, automatic home BP monitor)     Automatic Home BP monitor 4. HISTORY: "Do you have a history of high blood pressure?"     yes 5. MEDICATIONS: "Are you taking any medications for blood pressure?" "Have you missed any doses recently?"     Yes, took lisinopril last  2 days ago 6. OTHER SYMPTOMS: "Do you have any symptoms?" (e.g., headache, chest pain, blurred vision, difficulty breathing, weakness)     Light headache, mouth feels funny 7. PREGNANCY: "Is there any chance you are pregnant?" "When was your last menstrual period?"     n/a  Protocols used: HIGH BLOOD PRESSURE-A-AH

## 2019-05-11 NOTE — Telephone Encounter (Signed)
Spoke with patient and info given 

## 2019-05-17 ENCOUNTER — Ambulatory Visit
Admission: RE | Admit: 2019-05-17 | Discharge: 2019-05-17 | Disposition: A | Discharge status: Home / Self Care | Payer: Medicare HMO | Source: Ambulatory Visit | Attending: Family | Admitting: Family

## 2019-05-17 ENCOUNTER — Other Ambulatory Visit: Payer: Self-pay

## 2019-05-17 DIAGNOSIS — D1809 Hemangioma of other sites: Secondary | ICD-10-CM | POA: Diagnosis not present

## 2019-05-17 DIAGNOSIS — R634 Abnormal weight loss: Secondary | ICD-10-CM

## 2019-05-17 DIAGNOSIS — D1803 Hemangioma of intra-abdominal structures: Secondary | ICD-10-CM | POA: Diagnosis not present

## 2019-05-17 MED ORDER — IOPAMIDOL (ISOVUE-300) INJECTION 61%
100.0000 mL | Freq: Once | INTRAVENOUS | Status: AC | PRN
  Administered 2019-05-17: 15:00:00 100 mL via INTRAVENOUS

## 2019-05-18 ENCOUNTER — Other Ambulatory Visit: Payer: Self-pay | Admitting: Family

## 2019-05-18 DIAGNOSIS — R634 Abnormal weight loss: Secondary | ICD-10-CM

## 2019-05-18 DIAGNOSIS — R109 Unspecified abdominal pain: Secondary | ICD-10-CM

## 2019-05-23 ENCOUNTER — Telehealth: Payer: Self-pay | Admitting: Family

## 2019-05-23 NOTE — Telephone Encounter (Signed)
Spoke with patient and info given. He states he has been to the [place] regarding this issue and they have done nothing for him at all. He is wanting to know if there is any other GI doc you can refer him to as he doesn't really want to go back to the [place] for them to do nothing for him. I did let him know that Dr. Hilarie Fredrickson reviewed his notes and recommended he go back to the [place] for further work-up and treatment.

## 2019-05-23 NOTE — Telephone Encounter (Signed)
Please let Troy Little know that GI at [facility] wants him to continue to see GI at [facility]; they feel that will be most effective and quickest way to get him some answers. He needs to call provider at [facility] please.

## 2019-06-08 ENCOUNTER — Encounter: Payer: Self-pay | Admitting: Family

## 2019-06-08 ENCOUNTER — Other Ambulatory Visit: Payer: Self-pay

## 2019-06-08 ENCOUNTER — Ambulatory Visit (INDEPENDENT_AMBULATORY_CARE_PROVIDER_SITE_OTHER): Payer: Medicare HMO | Admitting: Family

## 2019-06-08 VITALS — BP 128/78 | HR 60 | Temp 98.1°F | Ht 68.0 in | Wt 140.1 lb

## 2019-06-08 DIAGNOSIS — R634 Abnormal weight loss: Secondary | ICD-10-CM

## 2019-06-08 DIAGNOSIS — R590 Localized enlarged lymph nodes: Secondary | ICD-10-CM

## 2019-06-08 NOTE — Progress Notes (Signed)
Troy Little is a 68 y.o. male with the following history as recorded in EpicCare:  Patient Active Problem List   Diagnosis Date Noted  . Essential hypertension 01/22/2019  . Numbness 10/20/2018  . Hepatitis C antibody test positive 11/03/2015  . Diastolic dysfunction 123456  . Hyperlipidemia 11/01/2015  . Chronic neck pain 11/01/2015  . Leg weakness   . TIA (transient ischemic attack) 10/31/2015  . Diabetes mellitus ([place]) 10/31/2015  . Anxiety and depression 10/31/2015  . Status post cholecystectomy 10/31/2015  . Tobacco use 10/31/2015  . Essential tremor 10/31/2015    Current Outpatient Medications  Medication Sig Dispense Refill  . amoxicillin (AMOXIL) 500 MG capsule TAKE 1 CAPSULE BY MOUTH THREE TIMES DAILY UNTIL GONE    . aspirin EC 81 MG tablet Take 81 mg by mouth daily.    Marland Kitchen glucose blood test strip 1 each by Other route 3 (three) times daily as needed for other. Use as instructed 100 each 6  . HYDROcodone-acetaminophen (NORCO/VICODIN) 5-325 MG tablet Take 1 tablet by mouth every 6 (six) hours as needed for moderate pain.    Marland Kitchen lisinopril (ZESTRIL) 40 MG tablet Take 1 tablet (40 mg total) by mouth daily. 90 tablet 1  . metFORMIN (GLUCOPHAGE) 500 MG tablet Take 1 tablet (500 mg total) by mouth daily with breakfast. Per patient, he takes 1/4 tablet per day "when he needs it"    . Omega-3 Fatty Acids (FISH OIL) 1000 MG CAPS Take by mouth.    . primidone (MYSOLINE) 50 MG tablet Take 1 tablet (50 mg total) by mouth 2 (two) times daily. Per patient, takes  1 1/2 tablet in the am and 1 tablet in the afternoon    . propranolol (INDERAL) 10 MG tablet Patient takes 1 1/2 tablet in the am, 1 in the afternoon, 1 in the evening    . simvastatin (ZOCOR) 20 MG tablet Take 20 mg by mouth daily.    . Vitamin D, Ergocalciferol, (DRISDOL) 1.25 MG (50000 UT) CAPS capsule Take 1 capsule (50,000 Units total) by mouth every 7 (seven) days for 12 doses. 12 capsule 0   No current  facility-administered medications for this visit.     Allergies: Dye fdc red [red dye] and Prozac [fluoxetine hcl]  Past Medical History:  Diagnosis Date  . Agoraphobia   . Anxiety   . Diabetes mellitus without complication ([place])   . Essential Tremor   . Hypercholesteremia   . MDD (major depressive disorder)   . Psychosis [facility])     Past Surgical History:  Procedure Laterality Date  . CHOLECYSTECTOMY      History reviewed. No pertinent family history.  Social History   Tobacco Use  . Smoking status: Current Every Day Smoker    Packs/day: 0.75    Years: 47.00    Pack years: 35.25    Types: Cigarettes  . Smokeless tobacco: Never Used  . Tobacco comment: plans on quitting after his current pack is completed.  Substance Use Topics  . Alcohol use: Yes    Subjective:  Patient had a root canal done on May 31, 2019; was given prescription for antibiotic ( Amoxicillin) to use if there were any concerns; started Amoxicillin 2 days ago; 500 mg 3 x per day and can already tell improvement in symptoms;   [facility] about [facility]; did have labs in that clinic and still waiting on results; abd/ pelvic CT and lung cancer screen earlier  this year were all normal; admits he is not eating as much as he used to; no nausea, night sweats or changes in bowel movements.     Objective:  Vitals:   06/08/19 1525  BP: 128/78  Pulse: 60  Temp: 98.1 F (36.7 C)  TempSrc: Oral  SpO2: 99%  Weight: 140 lb 1.9 oz (63.6 kg)  Height: 5\' 8"  (1.727 m)    General: Well developed, well nourished, in no acute distress  Skin : Warm and dry.  Head: Normocephalic and atraumatic  Eyes: Sclera and conjunctiva clear; pupils round and reactive to light; extraocular movements intact  Ears: External normal; canals clear; tympanic membranes normal  Oropharynx: Pink, supple. No suspicious lesions  Neck: Supple without thyromegaly, adenopathy  Lungs:  Respirations unlabored; clear to auscultation bilaterally without wheeze, rales, rhonchi  CVS exam: normal rate and regular rhythm.  Musculoskeletal: No deformities; no active joint inflammation  Extremities: No edema, cyanosis, clubbing  Vessels: Symmetric bilaterally  Neurologic: Alert and oriented; speech intact; face symmetrical; moves all extremities well; CNII-XII intact without focal deficit   Assessment:  1. Cervical lymphadenopathy   2. Unexplained weight loss     Plan:  1. Reassurance; suspect related to recent root canal; patient already on Amoxicillin and can tell improvement in symptoms within 24 hours; finish antibiotics as prescribed and keep planned up with endodontist; 2. Work up to this point has been reassuring- normal labs and normal lung, abd/ pelvic CT; encouraged increased caloric intake; follow-up in 2 months, sooner prn.   Return in about 2 months (around 08/08/2019).  No orders of the defined types were placed in this encounter.   Requested Prescriptions    No prescriptions requested or ordered in this encounter

## 2019-06-08 NOTE — Patient Instructions (Signed)
Try drinking Glucerna shakes; almonds, eggs, yogurt ( low-sugar);

## 2019-06-13 ENCOUNTER — Telehealth: Payer: Self-pay | Admitting: Family

## 2019-06-13 ENCOUNTER — Ambulatory Visit: Payer: Medicare HMO | Admitting: Family

## 2019-06-13 NOTE — Telephone Encounter (Signed)
Called and left message for patient today to contact dentis as there was not much that we would be able to help him with here.

## 2019-06-13 NOTE — Telephone Encounter (Signed)
Called and spoke with patient. He was already at the dentist office when I spoke with him.

## 2019-06-13 NOTE — Telephone Encounter (Signed)
Please call and let him know that he probably needs to call his dentist about his mouth issues. There is very little I can to help his mouth.

## 2019-08-06 ENCOUNTER — Ambulatory Visit: Payer: Medicare HMO | Admitting: Family

## 2019-08-13 ENCOUNTER — Encounter: Payer: Self-pay | Admitting: Family

## 2019-08-13 ENCOUNTER — Other Ambulatory Visit: Payer: Self-pay

## 2019-08-13 ENCOUNTER — Ambulatory Visit (INDEPENDENT_AMBULATORY_CARE_PROVIDER_SITE_OTHER): Payer: Medicare HMO | Admitting: Family

## 2019-08-13 VITALS — BP 124/80 | HR 58 | Temp 98.3°F | Ht 68.0 in | Wt 143.2 lb

## 2019-08-13 DIAGNOSIS — I10 Essential (primary) hypertension: Secondary | ICD-10-CM

## 2019-08-13 DIAGNOSIS — E119 Type 2 diabetes mellitus without complications: Secondary | ICD-10-CM | POA: Diagnosis not present

## 2019-08-13 NOTE — Progress Notes (Signed)
Troy Little is a 68 y.o. male with the following history as recorded in EpicCare:  Patient Active Problem List   Diagnosis Date Noted  . Essential hypertension 01/22/2019  . Numbness 10/20/2018  . Hepatitis C antibody test positive 11/03/2015  . Diastolic dysfunction 123456  . Hyperlipidemia 11/01/2015  . Chronic neck pain 11/01/2015  . Leg weakness   . TIA (transient ischemic attack) 10/31/2015  . Type 2 diabetes mellitus with hyperglycemia, without long-term current use of insulin ([place]) 10/31/2015  . Anxiety and depression 10/31/2015  . Status post cholecystectomy 10/31/2015  . Tobacco use 10/31/2015  . Essential tremor 10/31/2015    Current Outpatient Medications  Medication Sig Dispense Refill  . aspirin EC 81 MG tablet Take 81 mg by mouth daily.    Marland Kitchen glucose blood test strip 1 each by Other route 3 (three) times daily as needed for other. Use as instructed 100 each 6  . HYDROcodone-acetaminophen (NORCO/VICODIN) 5-325 MG tablet Take 1 tablet by mouth every 6 (six) hours as needed for moderate pain.    Marland Kitchen lisinopril (ZESTRIL) 40 MG tablet Take 1 tablet (40 mg total) by mouth daily. 90 tablet 1  . metFORMIN (GLUCOPHAGE) 500 MG tablet Take 1 tablet (500 mg total) by mouth daily with breakfast. Per patient, he takes 1/4 tablet per day "when he needs it"    . Omega-3 Fatty Acids (FISH OIL) 1000 MG CAPS Take by mouth.    . primidone (MYSOLINE) 50 MG tablet Take 1 tablet (50 mg total) by mouth 2 (two) times daily. Per patient, takes  1 1/2 tablet in the am and 1 tablet in the afternoon    . propranolol (INDERAL) 10 MG tablet Patient takes 1 1/2 tablet in the am, 1 in the afternoon, 1 in the evening    . simvastatin (ZOCOR) 20 MG tablet Take 20 mg by mouth daily.     No current facility-administered medications for this visit.     Allergies: Dye fdc red [red dye] and Prozac [fluoxetine hcl]  Past Medical History:  Diagnosis Date  . Agoraphobia   . Anxiety   . Diabetes  mellitus without complication ([place])   . Essential Tremor   . Hypercholesteremia   . MDD (major depressive disorder)   . Psychosis [facility])     Past Surgical History:  Procedure Laterality Date  . CHOLECYSTECTOMY      History reviewed. No pertinent family history.  Social History   Tobacco Use  . Smoking status: Current Every Day Smoker    Packs/day: 0.75    Years: 47.00    Pack years: 35.25    Types: Cigarettes  . Smokeless tobacco: Never Used  . Tobacco comment: plans on quitting after his current pack is completed.  Substance Use Topics  . Alcohol use: Yes    Subjective:  Patient presents for 2 month follow-up on his weight; notes that since his root canal was completed and he has been able to eat better, he has been "feeling great." Blood sugar and blood pressure have normalized; has gained 3 pounds according to scale here;       Objective:  Vitals:   08/13/19 1115  BP: 124/80  Pulse: (!) 58  Temp: 98.3 F (36.8 C)  TempSrc: Oral  SpO2: 99%  Weight: 143 lb 3.2 oz (65 kg)  Height: 5\' 8"  (1.727 m)    General: Well developed, well nourished, in no acute distress  Skin : Warm and dry.  Head: Normocephalic and atraumatic  Lungs: Respirations unlabored; clear to auscultation bilaterally without wheeze, rales, rhonchi  CVS exam: normal rate and regular rhythm.  Neurologic: Alert and oriented; speech intact; face symmetrical; moves all extremities well; CNII-XII intact without focal deficit   Assessment:  1. Essential hypertension   2. Type 2 diabetes mellitus without complication, without long-term current use of insulin ([facility]) Chronic    Plan:  Stable chronic conditions; patient defers labs today; correcting dental issue has helped with weight loss as patient now able to eat; follow up in 6 months, sooner prn.   This visit occurred during the SARS-CoV-2 public health emergency.  Safety protocols were in place, including screening questions prior to the visit,  additional usage of staff PPE, and extensive cleaning of exam room while observing appropriate contact time as indicated for disinfecting solutions.     Return in about 6 months (around 02/10/2020).  No orders of the defined types were placed in this encounter.   Requested Prescriptions    No prescriptions requested or ordered in this encounter

## 2019-08-28 ENCOUNTER — Other Ambulatory Visit: Payer: Self-pay | Admitting: Family

## 2019-08-28 MED ORDER — LISINOPRIL 40 MG PO TABS: 40.0000 mg | ORAL_TABLET | Freq: Every day | ORAL | 1 refills | Status: DC

## 2019-08-31 ENCOUNTER — Encounter (HOSPITAL_COMMUNITY): Payer: Self-pay | Admitting: Emergency Medicine

## 2019-08-31 ENCOUNTER — Emergency Department (HOSPITAL_COMMUNITY)
Admission: EM | Admit: 2019-08-31 | Discharge: 2019-08-31 | Disposition: A | Discharge status: Home / Self Care | Payer: No Typology Code available for payment source | Attending: Emergency Medicine | Admitting: Emergency Medicine

## 2019-08-31 ENCOUNTER — Other Ambulatory Visit: Payer: Self-pay

## 2019-08-31 DIAGNOSIS — I10 Essential (primary) hypertension: Secondary | ICD-10-CM | POA: Diagnosis not present

## 2019-08-31 DIAGNOSIS — Z7984 Long term (current) use of oral hypoglycemic drugs: Secondary | ICD-10-CM | POA: Insufficient documentation

## 2019-08-31 DIAGNOSIS — E119 Type 2 diabetes mellitus without complications: Secondary | ICD-10-CM | POA: Diagnosis not present

## 2019-08-31 DIAGNOSIS — F17210 Nicotine dependence, cigarettes, uncomplicated: Secondary | ICD-10-CM | POA: Insufficient documentation

## 2019-08-31 DIAGNOSIS — Z7982 Long term (current) use of aspirin: Secondary | ICD-10-CM | POA: Insufficient documentation

## 2019-08-31 DIAGNOSIS — Z79899 Other long term (current) drug therapy: Secondary | ICD-10-CM | POA: Insufficient documentation

## 2019-08-31 NOTE — ED Notes (Signed)
Patient left before completing the discharge process.

## 2019-08-31 NOTE — Discharge Instructions (Signed)
Your blood pressure is much improved here today.  Please only take your blood pressure medications as directed, taking extra doses of these can have bad effects on your kidneys.  Please continue checking your blood pressure and schedule a follow-up appointment with your primary care doctor to discuss your blood pressure medications.  If you develop chest pains, shortness of breath, headaches, vision changes, numbness, weakness or any other new or concerning symptoms return to the ED for reevaluation.

## 2019-08-31 NOTE — ED Provider Notes (Signed)
[facility] DEPT Provider Note   CSN: [id number] Arrival date & time: 08/31/19  1845     History Chief Complaint  Patient presents with  . Hypertension    Troy Little is a 68 y.o. male.  Troy Little is a 68 y.o. male with a history of hypertension, hyperlipidemia, diabetes, essential tremor, anxiety and depression, who presents to the emergency department for evaluation of elevated blood pressure.  Patient states that he takes lisinopril 40 mg every morning for his blood pressure, he also takes propranolol for his anxiety which he has been told will also help with blood pressure.  He states that he checks his blood pressure every morning and evening and records and he sometimes checks it if he is feeling a bit off.  He reports over the past 3-4 days he has noted that when he checks his blood pressure in the evening it is higher ranging from the [id number] systolic.  Tonight he noted that it was in the 180s, he states that he took half a tablet of his lisinopril, called the [facility] triage line and they recommended he come in to be checked out.  He states that he has not had any associated headaches, vision changes, numbness tingling or weakness.  He has not had any chest pain or shortness of breath.  No lower extremity swelling.  No lightheadedness or syncope.        Past Medical History:  Diagnosis Date  . Agoraphobia   . Anxiety   . Diabetes mellitus without complication ([place])   . Essential Tremor   . Hypercholesteremia   . MDD (major depressive disorder)   . Psychosis [facility])     Patient Active Problem List   Diagnosis Date Noted  . Essential hypertension 01/22/2019  . Numbness 10/20/2018  . Hepatitis C antibody test positive 11/03/2015  . Diastolic dysfunction 123456  . Hyperlipidemia 11/01/2015  . Chronic neck pain 11/01/2015  . Leg weakness   . TIA (transient ischemic attack) 10/31/2015  . Type 2 diabetes mellitus with  hyperglycemia, without long-term current use of insulin ([place]) 10/31/2015  . Anxiety and depression 10/31/2015  . Status post cholecystectomy 10/31/2015  . Tobacco use 10/31/2015  . Essential tremor 10/31/2015    Past Surgical History:  Procedure Laterality Date  . CHOLECYSTECTOMY         No family history on file.  Social History   Tobacco Use  . Smoking status: Current Every Day Smoker    Packs/day: 0.75    Years: 47.00    Pack years: 35.25    Types: Cigarettes  . Smokeless tobacco: Never Used  . Tobacco comment: plans on quitting after his current pack is completed.  Substance Use Topics  . Alcohol use: Yes  . Drug use: No    Types: Cocaine    Comment: denies    Home Medications Prior to Admission medications   Medication Sig Start Date End Date Taking? Authorizing Provider  aspirin EC 81 MG tablet Take 81 mg by mouth daily.    [provider]  glucose blood test strip 1 each by Other route 3 (three) times daily as needed for other. Use as instructed 05/08/19   Marrian Salvage, FNP  HYDROcodone-acetaminophen (NORCO/VICODIN) 5-325 MG tablet Take 1 tablet by mouth every 6 (six) hours as needed for moderate pain.    [provider]  lisinopril (ZESTRIL) 40 MG tablet Take 1 tablet (40 mg total) by mouth daily. 08/28/19  Marrian Salvage, FNP  metFORMIN (GLUCOPHAGE) 500 MG tablet Take 1 tablet (500 mg total) by mouth daily with breakfast. Per patient, he takes 1/4 tablet per day "when he needs it" 11/10/18   Marrian Salvage, FNP  Omega-3 Fatty Acids (FISH OIL) 1000 MG CAPS Take by mouth.    [provider]  primidone (MYSOLINE) 50 MG tablet Take 1 tablet (50 mg total) by mouth 2 (two) times daily. Per patient, takes  1 1/2 tablet in the am and 1 tablet in the afternoon 11/10/18   Marrian Salvage, FNP  propranolol (INDERAL) 10 MG tablet Patient takes 1 1/2 tablet in the am, 1 in the afternoon, 1 in the evening 11/10/18   Marrian Salvage, FNP  simvastatin (ZOCOR) 20 MG tablet Take 20 mg by mouth daily.    [provider]    Allergies    Dye fdc red [red dye] and Prozac [fluoxetine hcl]  Review of Systems   Review of Systems  Constitutional: Negative for chills and fever.  Eyes: Negative for visual disturbance.  Respiratory: Negative for shortness of breath.   Cardiovascular: Negative for chest pain and leg swelling.  Gastrointestinal: Negative for abdominal pain.  Neurological: Negative for dizziness, syncope, weakness, light-headedness, numbness and headaches.    Physical Exam Updated Vital Signs BP 119/73 (BP Location: Left Arm)   Pulse (!) 51   Temp 98 F (36.7 C) (Oral)   Resp 18   SpO2 99%   Physical Exam Vitals and nursing note reviewed.  Constitutional:      General: He is not in acute distress.    Appearance: Normal appearance. He is well-developed and normal weight. He is not diaphoretic.  HENT:     Head: Normocephalic and atraumatic.  Eyes:     General:        Right eye: No discharge.        Left eye: No discharge.  Cardiovascular:     Rate and Rhythm: Normal rate and regular rhythm.     Pulses: Normal pulses.     Heart sounds: Normal heart sounds. No murmur. No friction rub. No gallop.   Pulmonary:     Effort: Pulmonary effort is normal. No respiratory distress.     Breath sounds: Normal breath sounds. No wheezing or rales.     Comments: Respirations equal and unlabored, patient able to speak in full sentences, lungs clear to auscultation bilaterally Musculoskeletal:        General: No deformity.     Cervical back: Neck supple.  Skin:    General: Skin is warm and dry.     Capillary Refill: Capillary refill takes less than 2 seconds.  Neurological:     Mental Status: He is alert and oriented to person, place, and time.     Coordination: Coordination normal.     Comments: Speech is clear, able to follow commands Moves extremities without ataxia, coordination  intact  Psychiatric:        Mood and Affect: Mood normal.        Behavior: Behavior normal.     ED Results / Procedures / Treatments   Labs (all labs ordered are listed, but only abnormal results are displayed) Labs Reviewed - No data to display  EKG None  Radiology No results found.  Procedures Procedures (including critical care time)  Medications Ordered in ED Medications - No data to display  ED Course  I have reviewed the triage vital signs and the nursing  notes.  Pertinent labs & imaging results that were available during my care of the patient were reviewed by me and considered in my medical decision making (see chart for details).  Clinical Course as of Aug 30 1956  Fri Aug 31, 2019  1956 Patient arrives reporting elevated blood pressures over the past few days but on arrival patient's blood pressure is 119/73  BP: 119/73 [KF]    Clinical Course User Index [KF] Janet Berlin   MDM Rules/Calculators/A&P  68 year old male reporting elevated blood pressures, not hypertensive on arrival and all other vitals normal he is not having any symptoms to suggest hypertensive urgency or emergency.  Cautioned patient that he should not take extra doses of his blood pressure medications without the direction of his PCP as this can affect his kidney function.  Instructed patient to schedule follow-up appointment with his primary care doctor to discuss blood pressure medications, and to continue checking and recording his blood pressures.  Return precautions discussed.  Patient expresses understanding.  Discharged home in good condition.  Final Clinical Impression(s) / ED Diagnoses Final diagnoses:  Hypertension, unspecified type    Rx / DC Orders ED Discharge Orders    None       Janet Berlin 08/31/19 1957    Valarie Merino, MD 09/03/19 [phone]

## 2019-08-31 NOTE — ED Triage Notes (Signed)
Patient reports HTN x3 days despite taking medications as prescribed. Denies complaints. BP 119/73 in triage.

## 2019-09-02 ENCOUNTER — Encounter (HOSPITAL_COMMUNITY): Payer: Self-pay | Admitting: Emergency Medicine

## 2019-09-02 ENCOUNTER — Emergency Department (HOSPITAL_COMMUNITY)
Admission: EM | Admit: 2019-09-02 | Discharge: 2019-09-02 | Disposition: A | Discharge status: Home / Self Care | Payer: Medicare HMO | Attending: Emergency Medicine | Admitting: Emergency Medicine

## 2019-09-02 ENCOUNTER — Other Ambulatory Visit: Payer: Self-pay

## 2019-09-02 DIAGNOSIS — Z5321 Procedure and treatment not carried out due to patient leaving prior to being seen by health care provider: Secondary | ICD-10-CM | POA: Diagnosis not present

## 2019-09-02 DIAGNOSIS — R079 Chest pain, unspecified: Secondary | ICD-10-CM | POA: Diagnosis not present

## 2019-09-02 DIAGNOSIS — R001 Bradycardia, unspecified: Secondary | ICD-10-CM | POA: Diagnosis not present

## 2019-09-02 DIAGNOSIS — R0789 Other chest pain: Secondary | ICD-10-CM | POA: Insufficient documentation

## 2019-09-02 DIAGNOSIS — I10 Essential (primary) hypertension: Secondary | ICD-10-CM | POA: Diagnosis not present

## 2019-09-02 LAB — CBC
HCT: 42 % (ref 39.0–52.0)
Hemoglobin: 14 g/dL (ref 13.0–17.0)
MCH: 26.5 pg (ref 26.0–34.0)
MCHC: 33.3 g/dL (ref 30.0–36.0)
MCV: 79.4 fL — ABNORMAL LOW (ref 80.0–100.0)
Platelets: 160 K/uL (ref 150–400)
RBC: 5.29 MIL/uL (ref 4.22–5.81)
RDW: 12.9 % (ref 11.5–15.5)
WBC: 4.5 K/uL (ref 4.0–10.5)
nRBC: 0 % (ref 0.0–0.2)

## 2019-09-02 LAB — BASIC METABOLIC PANEL
Anion gap: 9 (ref 5–15)
BUN: 12 mg/dL (ref 8–23)
CO2: 25 mmol/L (ref 22–32)
Calcium: 9.4 mg/dL (ref 8.9–10.3)
Chloride: 105 mmol/L (ref 98–111)
Creatinine, Ser: 0.97 mg/dL (ref 0.61–1.24)
GFR calc Af Amer: >60 mL/min (ref >60)
GFR calc non Af Amer: >60 mL/min (ref >60)
Glucose, Bld: 140 mg/dL — ABNORMAL HIGH (ref 70–99)
Potassium: 4 mmol/L (ref 3.5–5.1)
Sodium: 139 mmol/L (ref 135–145)

## 2019-09-02 LAB — TROPONIN I (HIGH SENSITIVITY): Troponin I (High Sensitivity): 5 ng/L (ref <18)

## 2019-09-02 MED ORDER — SODIUM CHLORIDE 0.9% FLUSH: 3.0000 mL | Freq: Once | INTRAVENOUS | Status: DC

## 2019-09-02 NOTE — ED Notes (Signed)
Pt states he is feeling better and is leaving.  Encouraged him to stay and he declined.

## 2019-09-02 NOTE — ED Triage Notes (Signed)
Pt to triage by [facility] from home> c/o elevated BP today- only taking Lisinopril as needed because he felt like he didn't need it everyday.  Reports substernal chest pain since 3pm.  5/10.  Pt took ASA 324mg  prior to EMS and took his BP medication.  BP for EMS 122/70 and pain free.

## 2019-09-04 ENCOUNTER — Other Ambulatory Visit: Payer: Self-pay | Admitting: Family

## 2019-09-04 ENCOUNTER — Telehealth: Payer: Self-pay

## 2019-09-04 DIAGNOSIS — E1165 Type 2 diabetes mellitus with hyperglycemia: Secondary | ICD-10-CM

## 2019-09-06 DIAGNOSIS — M5412 Radiculopathy, cervical region: Secondary | ICD-10-CM | POA: Diagnosis not present

## 2019-09-06 DIAGNOSIS — R030 Elevated blood-pressure reading, without diagnosis of hypertension: Secondary | ICD-10-CM | POA: Diagnosis not present

## 2019-09-17 ENCOUNTER — Telehealth: Payer: Self-pay

## 2019-09-17 NOTE — Telephone Encounter (Signed)
New message  Call the patient to get additional information   Heart Cath done in [place] at [facility] clinic- was told  80/90 % blockage in heart. The patient is asking does this not constitutes an emergency visit.   The patient still having pain in chest and arm .  No emergency room visit.   Patient is asking for a call back to discuss with the nurse.  Copied from [place] [phone]. Topic: General - Other >> Sep 17, 2019 12:07 PM Reyne Dumas L wrote: Reason for CRM:   Pt states that he had a heart cath done on 12/15 and he wants to speak with Mickel Baas about that.  Pt will not give much information to me.  States that he doesn't understand everything.

## 2019-09-17 NOTE — Telephone Encounter (Signed)
Spoke with Dr. Ronnald Ramp regarding patient and symptoms. He was fine with bringing patient in office to be seen for further workup. Called and offered patient appointment to be seen today. Patient has declined office visit. States his chest and arm pain are gone for the moment and feels ok. Michela Pitcher he has spoken with the [place] and they were faxing notes over to a cardiologist at [facility] to review urgently and he is currently awaiting a phone call back from them for further instructions. He was instructed to go to ED should symptoms get worse by the [facility] and will plan to do so. He voiced understanding that he could be seen here today but he states he is fine with waiting on call from [facility] cardiologist for now.

## 2019-09-18 ENCOUNTER — Other Ambulatory Visit: Payer: Self-pay

## 2019-09-18 ENCOUNTER — Telehealth: Payer: Self-pay

## 2019-09-18 MED ORDER — CLOPIDOGREL BISULFATE 75 MG PO TABS: 75.0000 mg | ORAL_TABLET | Freq: Every day | ORAL | 3 refills | Status: DC

## 2019-09-18 NOTE — Telephone Encounter (Signed)
Spoke with Dr. Burt Knack, who reviewed the patient's films. The patient is scheduled for PCI 1/4 with Dr. Irish Lack and will need to start on Plavix 75 mg daily, with loading dose of 300 mg tomorrow.   Plavix called in to pharmacy.   Called patient to review medication instructions. Left message that he will be called tomorrow to confirm instructions.

## 2019-09-19 NOTE — Telephone Encounter (Signed)
Reviewed Plavix instructions with patient. He is leaving his house to go pick up medication now. He will take Plavix 300 mg when he picks it up and then 75 mg once daily starting tomorrow.  Reviewed COVID screening instructions and details for 1/2. Reviewed cath instructions. He understands to be at the [facility] at 0800 1/4. He understands not to eat after midnight and he may have clear liquids until 0500. He understands to take his ASA and Plavix the morning of the procedure and to hold metformin. He states he hasn't taken Metformin in 2 weeks and only takes it PRN.  He will bring one guest to sit in the waiting room. Both he and guest will wear masks. He understands he will need the guest to drive him home and to be with him 24 hours. He was grateful for assistance.

## 2019-09-22 ENCOUNTER — Other Ambulatory Visit (HOSPITAL_COMMUNITY)
Admission: RE | Admit: 2019-09-22 | Discharge: 2019-09-22 | Disposition: A | Discharge status: Home / Self Care | Payer: Medicare HMO | Source: Ambulatory Visit | Attending: Interventional Cardiology | Admitting: Interventional Cardiology

## 2019-09-22 DIAGNOSIS — Z01812 Encounter for preprocedural laboratory examination: Secondary | ICD-10-CM | POA: Diagnosis not present

## 2019-09-22 DIAGNOSIS — Z20822 Contact with and (suspected) exposure to covid-19: Secondary | ICD-10-CM | POA: Diagnosis not present

## 2019-09-22 DIAGNOSIS — R69 Illness, unspecified: Secondary | ICD-10-CM | POA: Diagnosis not present

## 2019-09-22 LAB — SARS CORONAVIRUS 2 (TAT 6-24 HRS): SARS Coronavirus 2: NEGATIVE

## 2019-09-24 ENCOUNTER — Encounter (HOSPITAL_COMMUNITY)
Admission: RE | Disposition: A | Discharge status: Home / Self Care | Payer: Self-pay | Source: Home / Self Care | Attending: Interventional Cardiology

## 2019-09-24 ENCOUNTER — Ambulatory Visit (HOSPITAL_COMMUNITY)
Admission: RE | Admit: 2019-09-24 | Discharge: 2019-09-24 | Disposition: A | Discharge status: Home / Self Care | Payer: No Typology Code available for payment source | Attending: Interventional Cardiology | Admitting: Interventional Cardiology

## 2019-09-24 ENCOUNTER — Other Ambulatory Visit: Payer: Self-pay

## 2019-09-24 DIAGNOSIS — E785 Hyperlipidemia, unspecified: Secondary | ICD-10-CM | POA: Diagnosis not present

## 2019-09-24 DIAGNOSIS — I10 Essential (primary) hypertension: Secondary | ICD-10-CM | POA: Diagnosis not present

## 2019-09-24 DIAGNOSIS — E119 Type 2 diabetes mellitus without complications: Secondary | ICD-10-CM | POA: Insufficient documentation

## 2019-09-24 DIAGNOSIS — F329 Major depressive disorder, single episode, unspecified: Secondary | ICD-10-CM | POA: Insufficient documentation

## 2019-09-24 DIAGNOSIS — F17210 Nicotine dependence, cigarettes, uncomplicated: Secondary | ICD-10-CM | POA: Diagnosis not present

## 2019-09-24 DIAGNOSIS — F419 Anxiety disorder, unspecified: Secondary | ICD-10-CM | POA: Diagnosis not present

## 2019-09-24 DIAGNOSIS — E7800 Pure hypercholesterolemia, unspecified: Secondary | ICD-10-CM | POA: Diagnosis not present

## 2019-09-24 DIAGNOSIS — Z79899 Other long term (current) drug therapy: Secondary | ICD-10-CM | POA: Insufficient documentation

## 2019-09-24 DIAGNOSIS — Z7982 Long term (current) use of aspirin: Secondary | ICD-10-CM | POA: Insufficient documentation

## 2019-09-24 DIAGNOSIS — Z7902 Long term (current) use of antithrombotics/antiplatelets: Secondary | ICD-10-CM | POA: Insufficient documentation

## 2019-09-24 DIAGNOSIS — Z8249 Family history of ischemic heart disease and other diseases of the circulatory system: Secondary | ICD-10-CM | POA: Insufficient documentation

## 2019-09-24 DIAGNOSIS — Z955 Presence of coronary angioplasty implant and graft: Secondary | ICD-10-CM

## 2019-09-24 DIAGNOSIS — Z7984 Long term (current) use of oral hypoglycemic drugs: Secondary | ICD-10-CM | POA: Insufficient documentation

## 2019-09-24 DIAGNOSIS — I25119 Atherosclerotic heart disease of native coronary artery with unspecified angina pectoris: Secondary | ICD-10-CM | POA: Diagnosis not present

## 2019-09-24 DIAGNOSIS — G250 Essential tremor: Secondary | ICD-10-CM | POA: Insufficient documentation

## 2019-09-24 DIAGNOSIS — I209 Angina pectoris, unspecified: Secondary | ICD-10-CM

## 2019-09-24 HISTORY — PX: CORONARY STENT INTERVENTION: CATH118234

## 2019-09-24 HISTORY — PX: LEFT HEART CATH: CATH118248

## 2019-09-24 LAB — BASIC METABOLIC PANEL
Anion gap: 8 (ref 5–15)
BUN: 15 mg/dL (ref 8–23)
CO2: 28 mmol/L (ref 22–32)
Calcium: 9.4 mg/dL (ref 8.9–10.3)
Chloride: 101 mmol/L (ref 98–111)
Creatinine, Ser: 0.94 mg/dL (ref 0.61–1.24)
GFR calc Af Amer: >60 mL/min (ref >60)
GFR calc non Af Amer: >60 mL/min (ref >60)
Glucose, Bld: 144 mg/dL — ABNORMAL HIGH (ref 70–99)
Potassium: 4.1 mmol/L (ref 3.5–5.1)
Sodium: 137 mmol/L (ref 135–145)

## 2019-09-24 LAB — CBC
HCT: 44.8 % (ref 39.0–52.0)
Hemoglobin: 14.6 g/dL (ref 13.0–17.0)
MCH: 26.1 pg (ref 26.0–34.0)
MCHC: 32.6 g/dL (ref 30.0–36.0)
MCV: 80 fL (ref 80.0–100.0)
Platelets: 185 10*3/uL (ref 150–400)
RBC: 5.6 MIL/uL (ref 4.22–5.81)
RDW: 13 % (ref 11.5–15.5)
WBC: 5 10*3/uL (ref 4.0–10.5)
nRBC: 0 % (ref 0.0–0.2)

## 2019-09-24 LAB — GLUCOSE, CAPILLARY
Glucose-Capillary: 152 mg/dL — ABNORMAL HIGH (ref 70–99)
Glucose-Capillary: 95 mg/dL (ref 70–99)

## 2019-09-24 LAB — POCT ACTIVATED CLOTTING TIME: Activated Clotting Time: 357 seconds

## 2019-09-24 SURGERY — CORONARY STENT INTERVENTION
Anesthesia: LOCAL

## 2019-09-24 MED ORDER — VERAPAMIL HCL 2.5 MG/ML IV SOLN
INTRAVENOUS | Status: DC | PRN
  Administered 2019-09-24: 12:00:00 10 mL via INTRA_ARTERIAL

## 2019-09-24 MED ORDER — SODIUM CHLORIDE 0.9% FLUSH: 3.0000 mL | INTRAVENOUS | Status: DC | PRN

## 2019-09-24 MED ORDER — HYDRALAZINE HCL 20 MG/ML IJ SOLN: 10.0000 mg | INTRAMUSCULAR | Status: DC | PRN

## 2019-09-24 MED ORDER — SODIUM CHLORIDE 0.9 % IV SOLN: 250.0000 mL | INTRAVENOUS | Status: DC | PRN

## 2019-09-24 MED ORDER — METFORMIN HCL 500 MG PO TABS: 500.0000 mg | ORAL_TABLET | Freq: Every day | ORAL | Status: DC

## 2019-09-24 MED ORDER — ASPIRIN EC 81 MG PO TBEC: 81.0000 mg | DELAYED_RELEASE_TABLET | Freq: Every day | ORAL | Status: DC

## 2019-09-24 MED ORDER — FENTANYL CITRATE (PF) 100 MCG/2ML IJ SOLN
INTRAMUSCULAR | Status: AC
  Filled 2019-09-24: qty 2

## 2019-09-24 MED ORDER — FISH OIL 500 MG PO CAPS: 500.0000 mg | ORAL_CAPSULE | Freq: Three times a day (TID) | ORAL | Status: DC

## 2019-09-24 MED ORDER — MIDAZOLAM HCL 2 MG/2ML IJ SOLN
INTRAMUSCULAR | Status: AC
  Filled 2019-09-24: qty 2

## 2019-09-24 MED ORDER — SODIUM CHLORIDE 0.9% FLUSH: 3.0000 mL | Freq: Two times a day (BID) | INTRAVENOUS | Status: DC

## 2019-09-24 MED ORDER — LISINOPRIL 40 MG PO TABS: 40.0000 mg | ORAL_TABLET | Freq: Every day | ORAL | Status: DC | PRN

## 2019-09-24 MED ORDER — ASPIRIN 81 MG PO CHEW: 81.0000 mg | CHEWABLE_TABLET | Freq: Every day | ORAL | Status: DC

## 2019-09-24 MED ORDER — ACETAMINOPHEN 325 MG PO TABS: 650.0000 mg | ORAL_TABLET | ORAL | Status: DC | PRN

## 2019-09-24 MED ORDER — LIDOCAINE HCL (PF) 1 % IJ SOLN
INTRAMUSCULAR | Status: AC
  Filled 2019-09-24: qty 30

## 2019-09-24 MED ORDER — ASPIRIN 81 MG PO CHEW
81.0000 mg | CHEWABLE_TABLET | ORAL | Status: AC
  Administered 2019-09-24: 81 mg via ORAL
  Filled 2019-09-24: qty 1

## 2019-09-24 MED ORDER — HEPARIN SODIUM (PORCINE) 1000 UNIT/ML IJ SOLN
INTRAMUSCULAR | Status: AC
  Filled 2019-09-24: qty 1

## 2019-09-24 MED ORDER — LABETALOL HCL 5 MG/ML IV SOLN: 10.0000 mg | INTRAVENOUS | Status: DC | PRN

## 2019-09-24 MED ORDER — SIMVASTATIN 20 MG PO TABS: 20.0000 mg | ORAL_TABLET | Freq: Every day | ORAL | Status: DC

## 2019-09-24 MED ORDER — MIDAZOLAM HCL 2 MG/2ML IJ SOLN
INTRAMUSCULAR | Status: DC | PRN
  Administered 2019-09-24: 2 mg via INTRAVENOUS

## 2019-09-24 MED ORDER — CLOPIDOGREL BISULFATE 75 MG PO TABS: 75.0000 mg | ORAL_TABLET | ORAL | Status: DC

## 2019-09-24 MED ORDER — PROPRANOLOL HCL 10 MG PO TABS: 10.0000 mg | ORAL_TABLET | Freq: Two times a day (BID) | ORAL | Status: DC

## 2019-09-24 MED ORDER — VERAPAMIL HCL 2.5 MG/ML IV SOLN
INTRAVENOUS | Status: AC
  Filled 2019-09-24: qty 2

## 2019-09-24 MED ORDER — SODIUM CHLORIDE 0.9 % WEIGHT BASED INFUSION
3.0000 mL/kg/h | INTRAVENOUS | Status: AC
  Administered 2019-09-24: 09:00:00 3 mL/kg/h via INTRAVENOUS

## 2019-09-24 MED ORDER — HEPARIN (PORCINE) IN NACL 1000-0.9 UT/500ML-% IV SOLN
INTRAVENOUS | Status: DC | PRN
  Administered 2019-09-24 (×2): 500 mL

## 2019-09-24 MED ORDER — HEPARIN SODIUM (PORCINE) 1000 UNIT/ML IJ SOLN
INTRAMUSCULAR | Status: DC | PRN
  Administered 2019-09-24: 8000 [IU] via INTRAVENOUS

## 2019-09-24 MED ORDER — LIDOCAINE HCL (PF) 1 % IJ SOLN
INTRAMUSCULAR | Status: DC | PRN
  Administered 2019-09-24: 2 mL

## 2019-09-24 MED ORDER — SODIUM CHLORIDE 0.9 % WEIGHT BASED INFUSION: 1.0000 mL/kg/h | INTRAVENOUS | Status: DC

## 2019-09-24 MED ORDER — FENTANYL CITRATE (PF) 100 MCG/2ML IJ SOLN
INTRAMUSCULAR | Status: DC | PRN
  Administered 2019-09-24: 25 ug via INTRAVENOUS

## 2019-09-24 MED ORDER — HEPARIN (PORCINE) IN NACL 1000-0.9 UT/500ML-% IV SOLN
INTRAVENOUS | Status: AC
  Filled 2019-09-24: qty 1000

## 2019-09-24 MED ORDER — CLOPIDOGREL BISULFATE 75 MG PO TABS: 75.0000 mg | ORAL_TABLET | Freq: Every day | ORAL | Status: DC

## 2019-09-24 MED ORDER — ONDANSETRON HCL 4 MG/2ML IJ SOLN: 4.0000 mg | Freq: Four times a day (QID) | INTRAMUSCULAR | Status: DC | PRN

## 2019-09-24 MED ORDER — HYDROCODONE-ACETAMINOPHEN 5-325 MG PO TABS: 1.0000 | ORAL_TABLET | Freq: Four times a day (QID) | ORAL | Status: DC | PRN

## 2019-09-24 MED ORDER — PRIMIDONE 50 MG PO TABS: 50.0000 mg | ORAL_TABLET | Freq: Two times a day (BID) | ORAL | Status: DC

## 2019-09-24 MED ORDER — SODIUM CHLORIDE 0.9 % IV SOLN: INTRAVENOUS | Status: AC

## 2019-09-24 MED ORDER — SODIUM CHLORIDE 0.9 % IV SOLN
INTRAVENOUS | Status: AC | PRN
  Administered 2019-09-24: 250 mL via INTRAVENOUS

## 2019-09-24 SURGICAL SUPPLY — 18 items
BALLN EMERGE MR 2.0X12 (BALLOONS) ×2
BALLN ~~LOC~~ EUPHORA RX 2.75X8 (BALLOONS) ×2
BALLOON EMERGE MR 2.0X12 (BALLOONS) IMPLANT
BALLOON ~~LOC~~ EUPHORA RX 2.75X8 (BALLOONS) IMPLANT
CATH VISTA GUIDE 6FR JR4 (CATHETERS) ×1 IMPLANT
DEVICE RAD COMP TR BAND LRG (VASCULAR PRODUCTS) ×1 IMPLANT
GLIDESHEATH SLEND SS 6F .021 (SHEATH) ×1 IMPLANT
GUIDEWIRE INQWIRE 1.5J.035X260 (WIRE) IMPLANT
INQWIRE 1.5J .035X260CM (WIRE) ×2
KIT ENCORE 26 ADVANTAGE (KITS) ×1 IMPLANT
KIT HEART LEFT (KITS) ×2 IMPLANT
KIT HEMO VALVE WATCHDOG (MISCELLANEOUS) ×1 IMPLANT
PACK CARDIAC CATHETERIZATION (CUSTOM PROCEDURE TRAY) ×2 IMPLANT
STENT SYNERGY XD 2.50X16 (Permanent Stent) IMPLANT
SYNERGY XD 2.50X16 (Permanent Stent) ×2 IMPLANT
TRANSDUCER W/STOPCOCK (MISCELLANEOUS) ×2 IMPLANT
TUBING CIL FLEX 10 FLL-RA (TUBING) ×2 IMPLANT
WIRE ASAHI PROWATER 180CM (WIRE) ×1 IMPLANT

## 2019-09-24 NOTE — Discharge Summary (Addendum)
Discharge Summary    Patient ID: [name] MRN: [id number]; DOB: 08-Aug-1951  Admit date: 09/24/2019 Discharge date: 09/24/2019  Primary Care Provider: Marrian Little, [place]  [facility]  Discharge Diagnoses    Active Problems:   Angina pectoris ([place])  HTN  HLD  Tobacco smoking   Diagnostic Studies/Procedures   CORONARY STENT INTERVENTION  Left Heart Cath  Conclusion    The left ventricular systolic function is normal.  LV end diastolic pressure is low; left heart cath done due to low BP post sedation. Additional IV hydration was given for low BP  The left ventricular ejection fraction is 55-65% by visual estimate. LVEF checked since there was no recent LVEF on the chart and additional fluids were needed.  There is no aortic valve stenosis.  Prox RCA lesion is 90% stenosed.  A drug-eluting stent was successfully placed using a SYNERGY XD 2.50X16, postdilated to 2.75 mm.  Post intervention, there is a 0% residual stenosis.  Hypotension resolved during the procedure.   Continue aspirin and Plavix.  He needs to stop smoking.       History of Present Illness     Troy Little is a 69 y.o. male with with a history of hypertension, diabetes mellitus, hyperlipidemia, ongoing tobacco smoking and psychosis presents for outpatient cath.  Troy Little recently dealing with left-sided chest pressure radiating to his left arm and shoulder.  Initially thought this is due to elevated systolic blood pressure in 170s.  No associated shortness of breath, diaphoresis or nausea.  Occasional dizziness.  Denies orthopnea, PND, syncope or melena.  40-pack-year tobacco smoking history.  Prior stress echocardiogram March 2017 showed normal LV function without evidence of ischemia.  Due to his symptoms concerning for angina he underwent cardiac catheterization on December 22 at [place].  His cath showed 80% stenosis at proximal RCA.  Otherwise mild nonobstructive  disease to 40% proximal LAD, 20% mid RCA and 20% second OM.  He was started on Plavix and   Hospital Course     Consultants: None  Cath showed 90% pRCA stenosis and underwent successful PCI using a SYNERGY XD 2.50X16, postdilated to 2.75. Given fluids for Low BP post sedation with good response. The patient was seen by Cardiac and Pulmonary Rehab will enrolled in phase II. Felt good candidate for same day PCI. Recommended smoking cessation.   Discharge Vitals Blood pressure (!) 103/41, pulse 62, temperature 98 F (36.7 C), temperature source Skin, resp. rate 10, height 5\' 8"  (1.727 m), weight 63.5 kg, SpO2 100 %.  Filed Weights   09/24/19 0814  Weight: 63.5 kg    Labs & Radiologic Studies    CBC Recent Labs    09/24/19 0814  WBC 5.0  HGB 14.6  HCT 44.8  MCV 80.0  PLT 123XX123   Basic Metabolic Panel Recent Labs    09/24/19 0814  NA 137  K 4.1  CL 101  CO2 28  GLUCOSE 144*  BUN 15  CREATININE 0.94  CALCIUM 9.4   High Sensitivity Troponin:   Recent Labs  Lab 09/02/19 1607  TROPONINIHS 5    CARDIAC CATHETERIZATION  Result Date: 09/24/2019  The left ventricular systolic function is normal.  LV end diastolic pressure is low; left heart cath done due to low BP post sedation. Additional IV hydration was given for low BP  The left ventricular ejection fraction is 55-65% by visual estimate. LVEF checked since there was no recent LVEF on the chart and additional  fluids were needed.  There is no aortic valve stenosis.  Prox RCA lesion is 90% stenosed.  A drug-eluting stent was successfully placed using a SYNERGY XD 2.50X16, postdilated to 2.75 mm.  Post intervention, there is a 0% residual stenosis.  Hypotension resolved during the procedure.  Continue aspirin and Plavix.  He needs to stop smoking.   Disposition   Pt is being discharged home today in good condition.  Follow-up Plans & Appointments    Follow-up Information    Clinic, [place]. Go on 10/03/2019.    Why: @9am  for cath follow up  Contact information: [place] [address] [phone]          Discharge Instructions    Amb Referral to Cardiac Rehabilitation   Complete by: As directed    Diagnosis: Coronary Stents   After initial evaluation and assessments completed: Virtual Based Care may be provided alone or in conjunction with Phase 2 Cardiac Rehab based on patient barriers.: Yes      Discharge Medications   Allergies as of 09/24/2019      Reactions   Prozac [fluoxetine Hcl] Other (See Comments)   agitation   Dye Fdc Red [red Dye] Rash      Medication List    TAKE these medications   aspirin EC 81 MG tablet Take 81 mg by mouth daily.   clopidogrel 75 MG tablet Commonly known as: PLAVIX Take 1 tablet (75 mg total) by mouth daily.   Fish Oil 500 MG Caps Take 500 mg by mouth 3 (three) times daily.   glucose blood test strip 1 each by Other route 3 (three) times daily as needed for other. Use as instructed   HYDROcodone-acetaminophen 5-325 MG tablet Commonly known as: NORCO/VICODIN Take 1 tablet by mouth every 6 (six) hours as needed for moderate pain.   lisinopril 40 MG tablet Commonly known as: ZESTRIL Take 1 tablet (40 mg total) by mouth daily. What changed:   when to take this  reasons to take this   metFORMIN 500 MG tablet Commonly known as: GLUCOPHAGE Take 1 tablet (500 mg total) by mouth daily with breakfast. Per patient, he takes 1/4 tablet per day "when he needs it"   primidone 50 MG tablet Commonly known as: MYSOLINE Take 1 tablet (50 mg total) by mouth 2 (two) times daily. Per patient, takes  1 1/2 tablet in the am and 1 tablet in the afternoon What changed: additional instructions   propranolol 10 MG tablet Commonly known as: INDERAL Patient takes 1 1/2 tablet in the am, 1 in the afternoon, 1 in the evening What changed:   how much to take  how to take this  when to take this  additional  instructions   simvastatin 20 MG tablet Commonly known as: ZOCOR Take 20 mg by mouth at bedtime.          Outstanding Labs/Studies   None  Duration of Discharge Encounter   Greater than 30 minutes including physician time.  SignedCrista Luria Landrum, PA 09/24/2019, 2:45 PM  I have examined the patient and reviewed assessment and plan and discussed with patient.  Agree with above as stated.    S/P PCI of RCA.  COntinue DAPT.  Stop smoking.  He will need lipid lowering therapy.  Normal LV function by cath.  Plan for discharge later today.   Larae Grooms

## 2019-09-24 NOTE — Progress Notes (Addendum)
CARDIAC REHAB PHASE I   Stent education completed with pt. Pt educated on importance of ASA, and Plavix. Pt given stent card, heart healthy and diabetic diets. Reviewed restrictions, site care, and exercise guidelines. Spoke with pt about smoking cessation, states he has been able to quit in the past, and is thinking about doing so again. Will refer to CRP II [place]. Pt is interested in participating in Virtual Cardiac and Pulmonary Rehab. Pt advised that Virtual Cardiac and Pulmonary Rehab is provided at no cost to the patient.  Checklist:  1. Pt has smart device  ie smartphone and/or ipad for downloading an app  Yes 2. Reliable internet/wifi service    Yes 3. Understands how to use their smartphone and navigate within an app.  Yes  Pt verbalized understanding and is in agreement.  [id number] Rufina Falco, RN BSN 09/24/2019 1:29 PM

## 2019-09-24 NOTE — H&P (Addendum)
Cardiology Admission History and Physical:   Patient ID: Troy Little MRN: [id number]; DOB: [birth year]   Admission date: 09/24/2019  Primary Care Provider: Marrian Little, [place] [facility]  Chief Complaint:  Outpatient cath   Patient Profile:   Troy Little is a 69 y.o. male with with a history of hypertension, diabetes mellitus, hyperlipidemia, ongoing tobacco smoking and psychosis presents for outpatient cath.  History of Present Illness:   Troy Little recently dealing with left-sided chest pressure radiating to his left arm and shoulder.  Initially thought this is due to elevated systolic blood pressure in 170s.  No associated shortness of breath, diaphoresis or nausea.  Occasional dizziness.  Denies orthopnea, PND, syncope or melena.  40-pack-year tobacco smoking history.  Prior stress echocardiogram March 2017 showed normal LV function without evidence of ischemia.  Due to his symptoms concerning for angina he underwent cardiac catheterization on December 22 at [place].  His cath showed 80% stenosis at proximal RCA.  Otherwise mild nonobstructive disease to 40% proximal LAD, 20% mid RCA and 20% second OM.  He was started on Plavix and recommended PCI of RCA.  Smoking cessation recommended.  Heart Pathway Score:     Past Medical History:  Diagnosis Date  . Agoraphobia   . Anxiety   . Diabetes mellitus without complication ([place])   . Essential Tremor   . Hypercholesteremia   . MDD (major depressive disorder)   . Psychosis [facility])     Past Surgical History:  Procedure Laterality Date  . CHOLECYSTECTOMY       Medications Prior to Admission: Prior to Admission medications   Medication Sig Start Date End Date Taking? Authorizing Provider  aspirin EC 81 MG tablet Take 81 mg by mouth daily.   Yes [provider]  clopidogrel (PLAVIX) 75 MG tablet Take 1 tablet (75 mg total) by mouth daily. 09/18/19 09/12/20 Yes Troy Mocha, MD   HYDROcodone-acetaminophen (NORCO/VICODIN) 5-325 MG tablet Take 1 tablet by mouth every 6 (six) hours as needed for moderate pain.   Yes [provider]  lisinopril (ZESTRIL) 40 MG tablet Take 1 tablet (40 mg total) by mouth daily. Patient taking differently: Take 40 mg by mouth daily as needed (High Blood pressure).  08/28/19  Yes Troy Salvage, FNP  metFORMIN (GLUCOPHAGE) 500 MG tablet Take 1 tablet (500 mg total) by mouth daily with breakfast. Per patient, he takes 1/4 tablet per day "when he needs it" 11/10/18  Yes Troy Little, Marvis Repress, FNP  Omega-3 Fatty Acids (FISH OIL) 500 MG CAPS Take 500 mg by mouth 3 (three) times daily.    Yes [provider]  primidone (MYSOLINE) 50 MG tablet Take 1 tablet (50 mg total) by mouth 2 (two) times daily. Per patient, takes  1 1/2 tablet in the am and 1 tablet in the afternoon Patient taking differently: Take 50 mg by mouth 2 (two) times daily.  11/10/18  Yes Troy Salvage, FNP  propranolol (INDERAL) 10 MG tablet Patient takes 1 1/2 tablet in the am, 1 in the afternoon, 1 in the evening Patient taking differently: Take 10 mg by mouth 2 (two) times daily.  11/10/18  Yes Troy Salvage, FNP  simvastatin (ZOCOR) 20 MG tablet Take 20 mg by mouth at bedtime.    Yes [provider]  glucose blood test strip 1 each by Other route 3 (three) times daily as needed for other. Use as instructed 05/08/19   Troy Little, [place]  Allergies:    Allergies  Allergen Reactions  . Prozac [Fluoxetine Hcl] Other (See Comments)    agitation  . Dye Fdc Red [Red Dye] Rash    Social History:   Social History   Socioeconomic History  . Marital status: Married    Spouse name: Not on file  . Number of children: Not on file  . Years of education: Not on file  . Highest education level: Not on file  Occupational History  . Not on file  Tobacco Use  . Smoking status: Current Every Day Smoker    Packs/day: 0.75     Years: 47.00    Pack years: 35.25    Types: Cigarettes  . Smokeless tobacco: Never Used  . Tobacco comment: plans on quitting after his current pack is completed.  Substance and Sexual Activity  . Alcohol use: Yes  . Drug use: No    Types: Cocaine    Comment: denies  . Sexual activity: Not on file  Other Topics Concern  . Not on file  Social History Narrative  . Not on file   Social Determinants of Health   Financial Resource Strain:   . Difficulty of Paying Living Expenses: Not on file  Food Insecurity:   . Worried About Charity fundraiser in the Last Year: Not on file  . Ran Out of Food in the Last Year: Not on file  Transportation Needs:   . Lack of Transportation (Medical): Not on file  . Lack of Transportation (Non-Medical): Not on file  Physical Activity:   . Days of Exercise per Week: Not on file  . Minutes of Exercise per Session: Not on file  Stress:   . Feeling of Stress : Not on file  Social Connections:   . Frequency of Communication with Friends and Family: Not on file  . Frequency of Social Gatherings with Friends and Family: Not on file  . Attends Religious Services: Not on file  . Active Member of Clubs or Organizations: Not on file  . Attends Archivist Meetings: Not on file  . Marital Status: Not on file  Intimate Partner Violence:   . Fear of Current or Ex-Partner: Not on file  . Emotionally Abused: Not on file  . Physically Abused: Not on file  . Sexually Abused: Not on file    Family History:   Patient reports family history of CAD  ROS:  Please see the history of present illness.  All other ROS reviewed and negative.     Physical Exam/Data:   Vitals:   09/24/19 0814  BP: 137/67  Pulse: (!) 52  Resp: 20  Temp: 98 F (36.7 C)  TempSrc: Skin  SpO2: 100%  Weight: 63.5 kg  Height: 5\' 8"  (1.727 m)   No intake or output data in the 24 hours ending 09/24/19 1001 Last 3 Weights 09/24/2019 08/13/2019 06/08/2019  Weight (lbs) 140  lb 143 lb 3.2 oz 140 lb 1.9 oz  Weight (kg) 63.504 kg 64.955 kg 63.558 kg     Body mass index is 21.29 kg/m.  General:  Well nourished, well developed, in no acute distress HEENT: normal Lymph: no adenopathy Neck: no JVD Endocrine:  No thryomegaly Vascular: No carotid bruits; FA pulses 2+ bilaterally without bruits  Cardiac:  normal S1, S2; RRR; no murmur Lungs:  clear to auscultation bilaterally, no wheezing, rhonchi or rales  Abd: soft, nontender, no hepatomegaly  Ext: noedema Musculoskeletal:  No deformities, BUE and BLE strength normal  and equal Skin: warm and dry  Neuro:  CNs 2-12 intact, no focal abnormalities noted Psych:  Normal affect    EKG:  The ECG that was done 09/02/19 was personally reviewed and demonstrates sinus bradycardia at rate of 53 bpm, incomplete right bundle branch block  Relevant CV Studies: Reviewed outpatient cath report  Laboratory Data:  High Sensitivity Troponin:   Recent Labs  Lab 09/02/19 1607  TROPONINIHS 5      Chemistry Recent Labs  Lab 09/24/19 0814  NA 137  K 4.1  CL 101  CO2 28  GLUCOSE 144*  BUN 15  CREATININE 0.94  CALCIUM 9.4  GFRNONAA >60  GFRAA >60  ANIONGAP 8    No results for input(s): PROT, ALBUMIN, AST, ALT, ALKPHOS, BILITOT in the last 168 hours. Hematology Recent Labs  Lab 09/24/19 0814  WBC 5.0  RBC 5.60  HGB 14.6  HCT 44.8  MCV 80.0  MCH 26.1  MCHC 32.6  RDW 13.0  PLT 185   BNPNo results for input(s): BNP, PROBNP in the last 168 hours.  DDimer No results for input(s): DDIMER in the last 168 hours.   Radiology/Studies:  No results found.   Assessment and Plan:  1. CAD Recent cardiac cath showed 80% mid RCA lesion.  Here for PCI.  On dual antiplatelet therapy.   2.  Hyperlipidemia -Followed at [place].  Simvastatin 20 mg daily.  3.  Diabetes mellitus -Recent hemoglobin A1c was 6.2 on September 2020.  4.  Tobacco smoking -Cessation recommended.  Severity of Illness: The appropriate  patient status for this patient is OBSERVATION. Observation status is judged to be reasonable and necessary in order to provide the required intensity of service to ensure the patient's safety. The patient's presenting symptoms, physical exam findings, and initial radiographic and laboratory data in the context of their medical condition is felt to place them at decreased risk for further clinical deterioration. Furthermore, it is anticipated that the patient will be medically stable for discharge from the hospital within 2 midnights of admission. The following factors support the patient status of observation.   " The patient's presenting symptoms include CP. " The physical exam findings include None   The initial radiographic and laboratory data are 80% RCA stenosis     For questions or updates, please contact [place] Please consult [email or web address] for contact info under        Signed, Leanor Kail, PA  09/24/2019 10:01 AM   I have examined the patient and reviewed assessment and plan and discussed with patient.  Agree with above as stated.  I reviewed his films.  He has an RCA lesion.  Will treat with DES and plan for same day PCI if no issues.   Larae Grooms

## 2019-09-24 NOTE — Interval H&P Note (Signed)
Cath Lab Visit (complete for each Cath Lab visit)  Clinical Evaluation Leading to the Procedure:   ACS: No.  Non-ACS:    Anginal Classification: CCS III  Anti-ischemic medical therapy: Minimal Therapy (1 class of medications)  Non-Invasive Test Results: No non-invasive testing performed  Prior CABG: No previous CABG      History and Physical Interval Note:  09/24/2019 11:29 AM  Troy Little  has presented today for surgery, with the diagnosis of CAD.  The various methods of treatment have been discussed with the patient and family. After consideration of risks, benefits and other options for treatment, the patient has consented to  Procedure(s): CORONARY STENT INTERVENTION (N/A) as a surgical intervention.  The patient's history has been reviewed, patient examined, no change in status, stable for surgery.  I have reviewed the patient's chart and labs.  Questions were answered to the patient's satisfaction.     Larae Grooms

## 2019-09-24 NOTE — Discharge Instructions (Signed)
Radial Site Care  This sheet gives you information about how to care for yourself after your procedure. Your health care provider may also give you more specific instructions. If you have problems or questions, contact your health care provider. What can I expect after the procedure? After the procedure, it is common to have:  Bruising and tenderness at the catheter insertion area. Follow these instructions at home: Medicines  Take over-the-counter and prescription medicines only as told by your health care provider. Insertion site care  Follow instructions from your health care provider about how to take care of your insertion site. Make sure you: ? Wash your hands with soap and water before you change your bandage (dressing). If soap and water are not available, use hand sanitizer. ? Change your dressing as told by your health care provider. ? Leave stitches (sutures), skin glue, or adhesive strips in place. These skin closures may need to stay in place for 2 weeks or longer. If adhesive strip edges start to loosen and curl up, you may trim the loose edges. Do not remove adhesive strips completely unless your health care provider tells you to do that.  Check your insertion site every day for signs of infection. Check for: ? Redness, swelling, or pain. ? Fluid or blood. ? Pus or a bad smell. ? Warmth.  Do not take baths, swim, or use a hot tub until your health care provider approves.  You may shower 24-48 hours after the procedure, or as directed by your health care provider. ? Remove the dressing and gently wash the site with plain soap and water. ? Pat the area dry with a clean towel. ? Do not rub the site. That could cause bleeding.  Do not apply powder or lotion to the site. Activity   For 24 hours after the procedure, or as directed by your health care provider: ? Do not flex or bend the affected arm. ? Do not push or pull heavy objects with the affected arm. ? Do not  drive yourself home from the hospital or clinic. You may drive 24 hours after the procedure unless your health care provider tells you not to. ? Do not operate machinery or power tools.  Do not lift anything that is heavier than 10 lb (4.5 kg), or the limit that you are told, until your health care provider says that it is safe.  Ask your health care provider when it is okay to: ? Return to work or school. ? Resume usual physical activities or sports. ? Resume sexual activity. General instructions  If the catheter site starts to bleed, raise your arm and put firm pressure on the site. If the bleeding does not stop, get help right away. This is a medical emergency.  If you went home on the same day as your procedure, a responsible adult should be with you for the first 24 hours after you arrive home.  Keep all follow-up visits as told by your health care provider. This is important. Contact a health care provider if:  You have a fever.  You have redness, swelling, or yellow drainage around your insertion site. Get help right away if:  You have unusual pain at the radial site.  The catheter insertion area swells very fast.  The insertion area is bleeding, and the bleeding does not stop when you hold steady pressure on the area.  Your arm or hand becomes pale, cool, tingly, or numb. These symptoms may represent a serious problem   that is an emergency. Do not wait to see if the symptoms will go away. Get medical help right away. Call your local emergency services (911 in the [place] Do not drive yourself to the hospital. Summary  After the procedure, it is common to have bruising and tenderness at the site.  Follow instructions from your health care provider about how to take care of your radial site wound. Check the wound every day for signs of infection.  Do not lift anything that is heavier than 10 lb (4.5 kg), or the limit that you are told, until your health care provider says  that it is safe. This information is not intended to replace advice given to you by your health care provider. Make sure you discuss any questions you have with your health care provider. Document Revised: 10/12/2017 Document Reviewed: 10/12/2017 Elsevier Patient Education  [place]. No driving for 48 hours. No lifting over 5 lbs for 1 week. No sexual activity for 1 week. You may return to work on 10/01/19. Keep procedure site clean & dry. If you notice increased pain, swelling, bleeding or pus, call/return!  You may shower, but no soaking baths/hot tubs/pools for 1 week.   Hold metformin for 2 days. Resume 09/26/19.

## 2019-09-28 ENCOUNTER — Telehealth (HOSPITAL_COMMUNITY): Payer: Self-pay

## 2019-09-28 NOTE — Telephone Encounter (Signed)
Due to department closure will wait to check pt's insurance once we reopen.  

## 2019-09-28 NOTE — Telephone Encounter (Signed)
Referral recv'd and verified for MD signature. No follow up appt schedule yet.  Will pass to RN Navigator for review, insurance benefits and eligibility TBD

## 2019-10-05 ENCOUNTER — Telehealth (HOSPITAL_COMMUNITY): Payer: Self-pay

## 2019-10-05 NOTE — Telephone Encounter (Signed)
Called and spoke with pt in regards to CR, pt stated he would like to wait until CR reopens to participate.  Placed pt's ppw in purple folder in bin on the wall.

## 2019-10-12 ENCOUNTER — Telehealth: Payer: Self-pay | Admitting: Interventional Cardiology

## 2019-10-12 NOTE — Telephone Encounter (Signed)
Patient has never been seen in the office. Cath report and hospital discharge summary faxed to number below. [facility] Dr. Irish Lack [facility]

## 2019-10-12 NOTE — Telephone Encounter (Signed)
New Message  Pam from medical records from the [facility] is calling to get the office notes from 09/24/2019 faxed over to [phone]. Please assist.

## 2019-10-31 ENCOUNTER — Telehealth: Payer: Self-pay | Admitting: Interventional Cardiology

## 2019-10-31 NOTE — Telephone Encounter (Signed)
Pt c/o medication issue:  1. Name of Medication: clopidogrel (PLAVIX) 75 MG tablet  2. How are you currently taking this medication (dosage and times per day)? As directed  3. Are you having a reaction (difficulty breathing--STAT)? Rash/ red spot   4. What is your medication issue? Patient had a rash that started on his wrist but has went away. Now he has a red spot on his arm that  has started to spread down his forearm and possibly on his leg.   The pt looked up the side effects and noticed that it can cause a rash or purple discolorations. He is darker skinned, so it doesn't look purple, but it does feel uncomfortable.

## 2019-10-31 NOTE — Telephone Encounter (Signed)
See phone note from 10/12/19

## 2019-11-01 NOTE — Telephone Encounter (Signed)
Appt scheduled to discuss Eliquis/lesions

## 2019-11-06 ENCOUNTER — Ambulatory Visit: Payer: Medicare HMO | Admitting: Registered"

## 2019-11-06 NOTE — Progress Notes (Deleted)
Cardiology Office Note    Date:  11/06/2019   ID:  Troy Little, DOB 29-Jun-1951, MRN [id number]  PCP:  Marrian Salvage, [place]  Cardiologist: No primary care provider on file. EPS: None  No chief complaint on file.   History of Present Illness:  Troy Little is a 69 y.o. male with history of hypertension, DM, HLD, tobacco abuse, psychosis.  Cath at the [facility] December 22 showed 80% proximal RCA otherwise mild nonobstructive disease with 40% proximal LAD 20% mid RCA and 20% second OM.  He was started on Plavix and PCI recommended.  Patient presented with left-sided chest pain into his left arm.  He was taken to the Cath Lab and found to have a 90% RCA and underwent DES.  He was given fluids for low blood pressure post sedation with good response.      Past Medical History:  Diagnosis Date  . Agoraphobia   . Anxiety   . Diabetes mellitus without complication ([place])   . Essential Tremor   . Hypercholesteremia   . MDD (major depressive disorder)   . Psychosis [facility])     Past Surgical History:  Procedure Laterality Date  . CHOLECYSTECTOMY    . CORONARY STENT INTERVENTION N/A 09/24/2019   Procedure: CORONARY STENT INTERVENTION;  Surgeon: Jettie Booze, MD;  Location: [place] CV LAB;  Service: Cardiovascular;  Laterality: N/A;  . LEFT HEART CATH N/A 09/24/2019   Procedure: Left Heart Cath;  Surgeon: Jettie Booze, MD;  Location: [place] CV LAB;  Service: Cardiovascular;  Laterality: N/A;    Current Medications: No outpatient medications have been marked as taking for the 11/07/19 encounter (Appointment) with Imogene Burn, PA-C.     Allergies:   Prozac [fluoxetine hcl] and Dye fdc red [red dye]   Social History   Socioeconomic History  . Marital status: Married    Spouse name: Not on file  . Number of children: Not on file  . Years of education: Not on file  . Highest education level: Not on file  Occupational History  . Not on file    Tobacco Use  . Smoking status: Current Every Day Smoker    Packs/day: 0.75    Years: 47.00    Pack years: 35.25    Types: Cigarettes  . Smokeless tobacco: Never Used  . Tobacco comment: plans on quitting after his current pack is completed.  Substance and Sexual Activity  . Alcohol use: Yes  . Drug use: No    Types: Cocaine    Comment: denies  . Sexual activity: Not on file  Other Topics Concern  . Not on file  Social History Narrative  . Not on file   Social Determinants of Health   Financial Resource Strain:   . Difficulty of Paying Living Expenses: Not on file  Food Insecurity:   . Worried About Charity fundraiser in the Last Year: Not on file  . Ran Out of Food in the Last Year: Not on file  Transportation Needs:   . Lack of Transportation (Medical): Not on file  . Lack of Transportation (Non-Medical): Not on file  Physical Activity:   . Days of Exercise per Week: Not on file  . Minutes of Exercise per Session: Not on file  Stress:   . Feeling of Stress : Not on file  Social Connections:   . Frequency of Communication with Friends and Family: Not on file  . Frequency of Social Gatherings  with Friends and Family: Not on file  . Attends Religious Services: Not on file  . Active Member of Clubs or Organizations: Not on file  . Attends Archivist Meetings: Not on file  . Marital Status: Not on file     Family History:  The patient's ***family history is not on file.   ROS:   Please see the history of present illness.    ROS All other systems reviewed and are negative.   PHYSICAL EXAM:   VS:  There were no vitals taken for this visit.  Physical Exam  GEN: Well nourished, well developed, in no acute distress  HEENT: normal  Neck: no JVD, carotid bruits, or masses Cardiac:RRR; no murmurs, rubs, or gallops  Respiratory:  clear to auscultation bilaterally, normal work of breathing GI: soft, nontender, nondistended, + BS Ext: without cyanosis,  clubbing, or edema, Good distal pulses bilaterally MS: no deformity or atrophy  Skin: warm and dry, no rash Neuro:  Alert and Oriented x 3, Strength and sensation are intact Psych: euthymic mood, full affect  Wt Readings from Last 3 Encounters:  09/24/19 140 lb (63.5 kg)  08/13/19 143 lb 3.2 oz (65 kg)  06/08/19 140 lb 1.9 oz (63.6 kg)      Studies/Labs Reviewed:   EKG:  EKG is*** ordered today.  The ekg ordered today demonstrates ***  Recent Labs: 05/08/2019: ALT 23; TSH 1.00 09/24/2019: BUN 15; Creatinine, Ser 0.94; Hemoglobin 14.6; Platelets 185; Potassium 4.1; Sodium 137   Lipid Panel    Component Value Date/Time   CHOL 129 05/08/2019 1123   TRIG 167.0 (H) 05/08/2019 1123   HDL 48.40 05/08/2019 1123   CHOLHDL 3 05/08/2019 1123   VLDL 33.4 05/08/2019 1123   LDLCALC 47 05/08/2019 1123    Additional studies/ records that were reviewed today include:  Cardiac cath 09/24/2019  Left Heart Cath  Conclusion      The left ventricular systolic function is normal.  LV end diastolic pressure is low; left heart cath done due to low BP post sedation. Additional IV hydration was given for low BP  The left ventricular ejection fraction is 55-65% by visual estimate. LVEF checked since there was no recent LVEF on the chart and additional fluids were needed.  There is no aortic valve stenosis.  Prox RCA lesion is 90% stenosed.  A drug-eluting stent was successfully placed using a SYNERGY XD [id number], postdilated to 2.75 mm.  Post intervention, there is a 0% residual stenosis.  Hypotension resolved during the procedure.   Continue aspirin and Plavix.  He needs to stop smoking.          ASSESSMENT:    1. Coronary artery disease involving native coronary artery of native heart without angina pectoris   2. Hyperlipidemia, unspecified hyperlipidemia type   3. Essential hypertension   4. Tobacco abuse   5. Type 2 diabetes mellitus with hyperglycemia, without long-term current  use of insulin (HCC)      PLAN:  In order of problems listed above:  CAD status post DES to the RCA 09/24/2019 with normal LVEF 55 to 65%  [facility]  Hypertension  DM A1c 6.2 05/2019  Tobacco abuse    Medication Adjustments/Labs and Tests Ordered: Current medicines are reviewed at length with the patient today.  Concerns regarding medicines are outlined above.  Medication changes, Labs and Tests ordered today are listed in the Patient Instructions below. There are no Patient Instructions on file  for this visit.   Sumner Boast, PA-C  11/06/2019 3:43 PM    [facility] [address], [place], [place]  [address] Phone: [phone]; Fax: [phone]

## 2019-11-07 ENCOUNTER — Ambulatory Visit: Payer: Medicare HMO | Admitting: Physician Assistant

## 2019-11-26 ENCOUNTER — Encounter: Payer: Self-pay | Admitting: Family

## 2019-11-26 ENCOUNTER — Ambulatory Visit (INDEPENDENT_AMBULATORY_CARE_PROVIDER_SITE_OTHER): Payer: Medicare HMO

## 2019-11-26 ENCOUNTER — Other Ambulatory Visit: Payer: Self-pay

## 2019-11-26 ENCOUNTER — Ambulatory Visit (INDEPENDENT_AMBULATORY_CARE_PROVIDER_SITE_OTHER): Payer: Medicare HMO | Admitting: Family

## 2019-11-26 VITALS — BP 140/80 | HR 60 | Temp 98.0°F | Ht 68.0 in | Wt 140.0 lb

## 2019-11-26 DIAGNOSIS — M25561 Pain in right knee: Secondary | ICD-10-CM

## 2019-11-26 NOTE — Progress Notes (Signed)
Troy Little is a 69 y.o. male with the following history as recorded in EpicCare:  Patient Active Problem List   Diagnosis Date Noted  . Angina pectoris ([place])   . Essential hypertension 01/22/2019  . Numbness 10/20/2018  . Hepatitis C antibody test positive 11/03/2015  . Diastolic dysfunction 123456  . Hyperlipidemia 11/01/2015  . Chronic neck pain 11/01/2015  . Leg weakness   . TIA (transient ischemic attack) 10/31/2015  . Type 2 diabetes mellitus with hyperglycemia, without long-term current use of insulin ([place]) 10/31/2015  . Anxiety and depression 10/31/2015  . Status post cholecystectomy 10/31/2015  . Tobacco use 10/31/2015  . Essential tremor 10/31/2015    Current Outpatient Medications  Medication Sig Dispense Refill  . aspirin EC 81 MG tablet Take 81 mg by mouth daily.    . clopidogrel (PLAVIX) 75 MG tablet Take 1 tablet (75 mg total) by mouth daily. 90 tablet 3  . glucose blood test strip 1 each by Other route 3 (three) times daily as needed for other. Use as instructed 100 each 6  . HYDROcodone-acetaminophen (NORCO/VICODIN) 5-325 MG tablet Take 1 tablet by mouth every 6 (six) hours as needed for moderate pain.    Marland Kitchen lisinopril (ZESTRIL) 40 MG tablet Take 1 tablet (40 mg total) by mouth daily. (Patient taking differently: Take 40 mg by mouth daily as needed (High Blood pressure). ) 90 tablet 1  . metFORMIN (GLUCOPHAGE) 500 MG tablet Take 1 tablet (500 mg total) by mouth daily with breakfast. Per patient, he takes 1/4 tablet per day "when he needs it"    . Omega-3 Fatty Acids (FISH OIL) 500 MG CAPS Take 500 mg by mouth 3 (three) times daily.     . primidone (MYSOLINE) 50 MG tablet Take 1 tablet (50 mg total) by mouth 2 (two) times daily. Per patient, takes  1 1/2 tablet in the am and 1 tablet in the afternoon (Patient taking differently: Take 50 mg by mouth 2 (two) times daily. )    . propranolol (INDERAL) 10 MG tablet Patient takes 1 1/2 tablet in the am, 1 in the  afternoon, 1 in the evening (Patient taking differently: Take 10 mg by mouth 2 (two) times daily. )    . simvastatin (ZOCOR) 20 MG tablet Take 20 mg by mouth at bedtime.      No current facility-administered medications for this visit.    Allergies: Prozac [fluoxetine hcl] and Dye fdc red [red dye]  Past Medical History:  Diagnosis Date  . Agoraphobia   . Anxiety   . Diabetes mellitus without complication ([place])   . Essential Tremor   . Hypercholesteremia   . MDD (major depressive disorder)   . Psychosis [facility])     Past Surgical History:  Procedure Laterality Date  . CHOLECYSTECTOMY    . CORONARY STENT INTERVENTION N/A 09/24/2019   Procedure: CORONARY STENT INTERVENTION;  Surgeon: Jettie Booze, MD;  Location: [place] [facility];  Service: Cardiovascular;  Laterality: N/A;  . LEFT HEART CATH N/A 09/24/2019   Procedure: Left Heart Cath;  Surgeon: Jettie Booze, MD;  Location: [place] [facility];  Service: Cardiovascular;  Laterality: N/A;    History reviewed. No pertinent family history.  Social History   Tobacco Use  . Smoking status: Current Every Day Smoker    Packs/day: 0.75    Years: 47.00    Pack years: 35.25    Types: Cigarettes  . Smokeless tobacco: Never Used  . Tobacco comment: plans  on quitting after his current pack is completed.  Substance Use Topics  . Alcohol use: Yes    Subjective:  Right knee pain x 1 week; no known injury or trauma; "just woke up and it was hurting." Localized pain over left medial side of knee; does feel that his knee occasionally gives out on him; notes that if he needs MRI, he will want to get his done through the [place] due to cost issues.  Is having his diabetes/ cholesterol labs done at the [place]; his PCP has been doing the follow-up for him.   Objective:  Vitals:   11/26/19 1115  BP: 140/80  Pulse: 60  Temp: 98 F (36.7 C)  TempSrc: Oral  SpO2: 95%  Weight: 140 lb (63.5 kg)  Height: 5\' 8"  (1.727 m)    General: Well  developed, well nourished, in no acute distress  Skin : Warm and dry.  Head: Normocephalic and atraumatic  Lungs: Respirations unlabored;  Musculoskeletal: No deformities; no active joint inflammation  Extremities: No edema, cyanosis, clubbing  Vessels: Symmetric bilaterally  Neurologic: Alert and oriented; speech intact; face symmetrical; moves all extremities well; CNII-XII intact without focal deficit   Assessment:  1. Acute pain of right knee     Plan:  Update X-ray today; not a candidate for NSAIDs due to Plavix; continue Tylenol; he will most likely end up at the [facility] for follow-up/ treatment;  This visit occurred during the SARS-CoV-2 public health emergency.  Safety protocols were in place, including screening questions prior to the visit, additional usage of staff PPE, and extensive cleaning of exam room while observing appropriate contact time as indicated for disinfecting solutions.     No follow-ups on file.  Orders Placed This Encounter  Procedures  . DG Knee Complete 4 Views Right    Standing Status:   Future    Number of Occurrences:   1    Standing Expiration Date:   01/25/2021    Order Specific Question:   Reason for Exam (SYMPTOM  OR DIAGNOSIS REQUIRED)    Answer:   right knee pain    Order Specific Question:   Preferred imaging location?    Answer:   [facility]    Order Specific Question:   Radiology Contrast Protocol - do NOT remove file path    Answer:   [email or web address]    Requested Prescriptions    No prescriptions requested or ordered in this encounter

## 2019-11-27 ENCOUNTER — Telehealth: Payer: Self-pay | Admitting: Family

## 2019-11-27 NOTE — Telephone Encounter (Signed)
      1. Which medications need to be refilled? (please list name of each medication and dose if known)  FreeStyle Lite test strips  2. Which pharmacy/location (including street and city if local pharmacy) is medication to be sent to? [place], [place].

## 2019-11-28 NOTE — Telephone Encounter (Signed)
Completed already. 

## 2019-11-29 ENCOUNTER — Other Ambulatory Visit: Payer: Self-pay | Admitting: Family

## 2019-11-29 MED ORDER — GLUCOSE BLOOD VI STRP: 1.0000 | ORAL_STRIP | Freq: Three times a day (TID) | 6 refills | Status: DC | PRN

## 2019-12-03 ENCOUNTER — Telehealth: Payer: Self-pay | Admitting: Family

## 2019-12-03 NOTE — Telephone Encounter (Signed)
New message:   Pt is calling and states that his insurance is requiring a prior authorization for his test strips. Please advise.

## 2019-12-04 NOTE — Progress Notes (Signed)
Cardiology Office Note    Date:  12/05/2019   ID:  Troy Little, DOB March 06, 1951, MRN [id number]  PCP:  Marrian Salvage, [place]  Cardiologist: Larae Grooms, MD EPS: None  Chief Complaint  Patient presents with  . Follow-up    History of Present Illness:  Troy Little is a 69 y.o. male with history of hypertension, DM, HLD, tobacco abuse, psychosis.  Cath at the [facility] December 22 showed 80% proximal RCA otherwise mild nonobstructive disease with 40% proximal LAD 20% mid RCA and 20% second OM.  He was started on Plavix and PCI recommended.  Patient presented with left-sided chest pain into his left arm.  He was taken to the Cath Lab and found to have a 90% RCA and underwent DES the proximal RCA 09/24/2019 sent home on Plavix and aspirin and advised to quit smoking. Marland Kitchen  He was given fluids for low blood pressure post sedation with good response.  Patient comes in for f/u. Feeling so much better without chest pain. A few occasions of BP going up with knee and neck pain. Wasn't able to do cardiac rehab because it shut down. Hasn't been exercising but has been painting and working on his son. Still smoking 1/2 ppd. Has the nicotine patches but hasn't tried them yet. Received both doses of the covid19 vaccine.    Past Medical History:  Diagnosis Date  . Agoraphobia   . Anxiety   . Diabetes mellitus without complication ([place])   . Essential Tremor   . Hypercholesteremia   . MDD (major depressive disorder)   . Psychosis [facility])     Past Surgical History:  Procedure Laterality Date  . CHOLECYSTECTOMY    . CORONARY STENT INTERVENTION N/A 09/24/2019   Procedure: CORONARY STENT INTERVENTION;  Surgeon: Jettie Booze, MD;  Location: [place] [facility];  Service: Cardiovascular;  Laterality: N/A;  . LEFT HEART CATH N/A 09/24/2019   Procedure: Left Heart Cath;  Surgeon: Jettie Booze, MD;  Location: [place] [facility];  Service: Cardiovascular;  Laterality: N/A;     Current Medications: No outpatient medications have been marked as taking for the 12/05/19 encounter (Office Visit) with Imogene Burn, PA-C.     Allergies:   Prozac [fluoxetine hcl] and Dye fdc red [red dye]   Social History   Socioeconomic History  . Marital status: Married    Spouse name: Not on file  . Number of children: Not on file  . Years of education: Not on file  . Highest education level: Not on file  Occupational History  . Not on file  Tobacco Use  . Smoking status: Current Every Day Smoker    Packs/day: 0.75    Years: 47.00    Pack years: 35.25    Types: Cigarettes  . Smokeless tobacco: Never Used  . Tobacco comment: plans on quitting after his current pack is completed.  Substance and Sexual Activity  . Alcohol use: Yes  . Drug use: No    Types: Cocaine    Comment: denies  . Sexual activity: Not on file  Other Topics Concern  . Not on file  Social History Narrative  . Not on file   Social Determinants of Health   Financial Resource Strain:   . Difficulty of Paying Living Expenses:   Food Insecurity:   . Worried About Charity fundraiser in the Last Year:   . Arboriculturist in the Last Year:   Transportation Needs:   .  Lack of Transportation (Medical):   Marland Kitchen Lack of Transportation (Non-Medical):   Physical Activity:   . Days of Exercise per Week:   . Minutes of Exercise per Session:   Stress:   . Feeling of Stress :   Social Connections:   . Frequency of Communication with Friends and Family:   . Frequency of Social Gatherings with Friends and Family:   . Attends Religious Services:   . Active Member of Clubs or Organizations:   . Attends Archivist Meetings:   Marland Kitchen Marital Status:      Family History:  The patient's family history includes Cancer in his mother.   ROS:   Please see the history of present illness.    ROS All other systems reviewed and are negative.   PHYSICAL EXAM:   VS:  BP 120/62   Pulse (!) 59   Ht 5\' 8"   (1.727 m)   Wt 140 lb (63.5 kg)   SpO2 96%   BMI 21.29 kg/m   Physical Exam  GEN: Well nourished, well developed, in no acute distress  Neck: no JVD, carotid bruits, or masses Cardiac:RRR;some skipping, no murmurs, rubs, or gallops  Respiratory:  clear to auscultation bilaterally, normal work of breathing GI: soft, nontender, nondistended, + BS Ext: without cyanosis, clubbing, or edema, Good distal pulses bilaterally Neuro:  Alert and Oriented x 3 Psych: euthymic mood, full affect  Wt Readings from Last 3 Encounters:  12/05/19 140 lb (63.5 kg)  11/26/19 140 lb (63.5 kg)  09/24/19 140 lb (63.5 kg)      Studies/Labs Reviewed:   EKG:  EKG is not ordered today.   Recent Labs: 05/08/2019: ALT 23; TSH 1.00 09/24/2019: BUN 15; Creatinine, Ser 0.94; Hemoglobin 14.6; Platelets 185; Potassium 4.1; Sodium 137   Lipid Panel    Component Value Date/Time   CHOL 129 05/08/2019 1123   TRIG 167.0 (H) 05/08/2019 1123   HDL 48.40 05/08/2019 1123   CHOLHDL 3 05/08/2019 1123   VLDL 33.4 05/08/2019 1123   LDLCALC 47 05/08/2019 1123    Additional studies/ records that were reviewed today include:  Left Heart Cath 09/24/2019  Conclusion      The left ventricular systolic function is normal.  LV end diastolic pressure is low; left heart cath done due to low BP post sedation. Additional IV hydration was given for low BP  The left ventricular ejection fraction is 55-65% by visual estimate. LVEF checked since there was no recent LVEF on the chart and additional fluids were needed.  There is no aortic valve stenosis.  Prox RCA lesion is 90% stenosed.  A drug-eluting stent was successfully placed using a SYNERGY XD 2.50X16, postdilated to 2.75 mm.  Post intervention, there is a 0% residual stenosis.  Hypotension resolved during the procedure.   Continue aspirin and Plavix.  He needs to stop smoking.           ASSESSMENT:    1. Coronary artery disease involving native coronary  artery of native heart without angina pectoris   2. Hyperlipidemia, unspecified hyperlipidemia type   3. Essential hypertension   4. Type 2 diabetes mellitus with hyperglycemia, without long-term current use of insulin (HCC)   5. Tobacco abuse      PLAN:  In order of problems listed above:  CAD status post DES to the RCA 09/24/2019 with normal LVEF 55 to 65% no further angina.  Continue Plavix aspirin lisinopril and Inderal and Zocor.  Interested in cardiac rehab.  We  will reach out to them.  Follow-up in 3 months with Dr. Irish Lack.  [facility] LDL 47 04/2019  Hypertension blood pressure well controlled.  DM A1c 6.2 05/2019  Tobacco abuse still smoking a half a pack daily.  Long discussion about smoking cessation.  He has a nicotine patches but has not tried them.  I encouraged him to quit smoking.     Medication Adjustments/Labs and Tests Ordered: Current medicines are reviewed at length with the patient today.  Concerns regarding medicines are outlined above.  Medication changes, Labs and Tests ordered today are listed in the Patient Instructions below. There are no Patient Instructions on file for this visit.   Signed, Ermalinda Barrios, PA-C  12/05/2019 1:02 PM    [facility] [address], [place], [place]  [address] Phone: [phone]; Fax: [phone]

## 2019-12-05 ENCOUNTER — Encounter: Payer: Self-pay | Admitting: Physician Assistant

## 2019-12-05 ENCOUNTER — Ambulatory Visit (INDEPENDENT_AMBULATORY_CARE_PROVIDER_SITE_OTHER): Payer: Medicare HMO | Admitting: Physician Assistant

## 2019-12-05 ENCOUNTER — Other Ambulatory Visit: Payer: Self-pay

## 2019-12-05 VITALS — BP 120/62 | HR 59 | Ht 68.0 in | Wt 140.0 lb

## 2019-12-05 DIAGNOSIS — E1165 Type 2 diabetes mellitus with hyperglycemia: Secondary | ICD-10-CM

## 2019-12-05 DIAGNOSIS — E785 Hyperlipidemia, unspecified: Secondary | ICD-10-CM | POA: Diagnosis not present

## 2019-12-05 DIAGNOSIS — Z720 Tobacco use: Secondary | ICD-10-CM

## 2019-12-05 DIAGNOSIS — I10 Essential (primary) hypertension: Secondary | ICD-10-CM

## 2019-12-05 DIAGNOSIS — I2510 Atherosclerotic heart disease of native coronary artery without angina pectoris: Secondary | ICD-10-CM

## 2019-12-05 NOTE — Patient Instructions (Signed)
Medication Instructions:  Your physician recommends that you continue on your current medications as directed. Please refer to the Current Medication list given to you today.  *If you need a refill on your cardiac medications before your next appointment, please call your pharmacy*   Lab Work: None ordered  If you have labs (blood work) drawn today and your tests are completely normal, you will receive your results only by: Marland Kitchen MyChart Message (if you have MyChart) OR . A paper copy in the mail If you have any lab test that is abnormal or we need to change your treatment, we will call you to review the results.   Testing/Procedures: None ordered   Follow-Up: Follow up with Dr. Irish Lack on 03/10/20 at 1:20 PM  Other Instructions Your provider recommends that you maintain 150 minutes per week of moderate aerobic activity.   Steps to Quit Smoking Smoking tobacco is the leading cause of preventable death. It can affect almost every organ in the body. Smoking puts you and people around you at risk for many serious, long-lasting (chronic) diseases. Quitting smoking can be hard, but it is one of the best things that you can do for your health. It is never too late to quit. How do I get ready to quit? When you decide to quit smoking, make a plan to help you succeed. Before you quit:  Pick a date to quit. Set a date within the next 2 weeks to give you time to prepare.  Write down the reasons why you are quitting. Keep this list in places where you will see it often.  Tell your family, friends, and co-workers that you are quitting. Their support is important.  Talk with your doctor about the choices that may help you quit.  Find out if your health insurance will pay for these treatments.  Know the people, places, things, and activities that make you want to smoke (triggers). Avoid them. What first steps can I take to quit smoking?  Throw away all cigarettes at home, at work, and in  your car.  Throw away the things that you use when you smoke, such as ashtrays and lighters.  Clean your car. Make sure to empty the ashtray.  Clean your home, including curtains and carpets. What can I do to help me quit smoking? Talk with your doctor about taking medicines and seeing a counselor at the same time. You are more likely to succeed when you do both.  If you are pregnant or breastfeeding, talk with your doctor about counseling or other ways to quit smoking. Do not take medicine to help you quit smoking unless your doctor tells you to do so. To quit smoking: Quit right away  Quit smoking totally, instead of slowly cutting back on how much you smoke over a period of time.  Go to counseling. You are more likely to quit if you go to counseling sessions regularly. Take medicine You may take medicines to help you quit. Some medicines need a prescription, and some you can buy over-the-counter. Some medicines may contain a drug called nicotine to replace the nicotine in cigarettes. Medicines may:  Help you to stop having the desire to smoke (cravings).  Help to stop the problems that come when you stop smoking (withdrawal symptoms). Your doctor may ask you to use:  Nicotine patches, gum, or lozenges.  Nicotine inhalers or sprays.  Non-nicotine medicine that is taken by mouth. Find resources Find resources and other ways to help you quit  smoking and remain smoke-free after you quit. These resources are most helpful when you use them often. They include:  Online chats with a Social worker.  Phone quitlines.  Printed Furniture conservator/restorer.  Support groups or group counseling.  Text messaging programs.  Mobile phone apps. Use apps on your mobile phone or tablet that can help you stick to your quit plan. There are many free apps for mobile phones and tablets as well as websites. Examples include Quit Guide from the [employer] and smokefree.gov  What things can I do to make it easier  to quit?   Talk to your family and friends. Ask them to support and encourage you.  Call a phone quitline (1-800-QUIT-NOW), reach out to support groups, or work with a Social worker.  Ask people who smoke to not smoke around you.  Avoid places that make you want to smoke, such as: ? Bars. ? Parties. ? Smoke-break areas at work.  Spend time with people who do not smoke.  Lower the stress in your life. Stress can make you want to smoke. Try these things to help your stress: ? Getting regular exercise. ? Doing deep-breathing exercises. ? Doing yoga. ? Meditating. ? Doing a body scan. To do this, close your eyes, focus on one area of your body at a time from head to toe. Notice which parts of your body are tense. Try to relax the muscles in those areas. How will I feel when I quit smoking? Day 1 to 3 weeks Within the first 24 hours, you may start to have some problems that come from quitting tobacco. These problems are very bad 2-3 days after you quit, but they do not often last for more than 2-3 weeks. You may get these symptoms:  Mood swings.  Feeling restless, nervous, angry, or annoyed.  Trouble concentrating.  Dizziness.  Strong desire for high-sugar foods and nicotine.  Weight gain.  Trouble pooping (constipation).  Feeling like you may vomit (nausea).  Coughing or a sore throat.  Changes in how the medicines that you take for other issues work in your body.  Depression.  Trouble sleeping (insomnia). Week 3 and afterward After the first 2-3 weeks of quitting, you may start to notice more positive results, such as:  Better sense of smell and taste.  Less coughing and sore throat.  Slower heart rate.  Lower blood pressure.  Clearer skin.  Better breathing.  Fewer sick days. Quitting smoking can be hard. Do not give up if you fail the first time. Some people need to try a few times before they succeed. Do your best to stick to your quit plan, and talk with  your doctor if you have any questions or concerns. Summary  Smoking tobacco is the leading cause of preventable death. Quitting smoking can be hard, but it is one of the best things that you can do for your health.  When you decide to quit smoking, make a plan to help you succeed.  Quit smoking right away, not slowly over a period of time.  When you start quitting, seek help from your doctor, family, or friends. This information is not intended to replace advice given to you by your health care provider. Make sure you discuss any questions you have with your health care provider. Document Revised: 06/01/2019 Document Reviewed: 11/25/2018 Elsevier Patient Education  [employer].

## 2019-12-06 ENCOUNTER — Encounter: Payer: Self-pay | Admitting: Family

## 2019-12-07 NOTE — Telephone Encounter (Signed)
Troy Little (Key: [id number])

## 2019-12-10 ENCOUNTER — Other Ambulatory Visit: Payer: Self-pay | Admitting: Family

## 2019-12-10 ENCOUNTER — Telehealth (HOSPITAL_COMMUNITY): Payer: Self-pay

## 2019-12-10 ENCOUNTER — Encounter (HOSPITAL_COMMUNITY): Payer: Self-pay

## 2019-12-10 MED ORDER — BLOOD GLUCOSE MONITOR KIT: PACK | 0 refills | Status: DC

## 2019-12-10 NOTE — Telephone Encounter (Signed)
Attempted to call patient in regards to Cardiac Rehab - LM on VM Mailed letter 

## 2019-12-10 NOTE — Telephone Encounter (Signed)
Pt insurance is active and benefits verified through [facility]. Co-pay $10.00, DED $0.00/$0.00 met, out of pocket $3,900.00/$0.00 met, co-insurance 0%. No pre-authorization required. Passport, 12/10/19 @ 3:39PM, KGK#[id number]

## 2019-12-11 ENCOUNTER — Ambulatory Visit: Payer: Medicare HMO | Admitting: Family Medicine

## 2019-12-11 NOTE — Telephone Encounter (Signed)
Received denial from insurance company.  Patient had new meter sent yesterday with testing supplies from [employer] [facility] that was on his covered [facility] plan. Patient didn't want to go through the [place] to obtain test strips for free.

## 2019-12-12 ENCOUNTER — Telehealth: Payer: Self-pay | Admitting: Family

## 2019-12-12 ENCOUNTER — Telehealth (HOSPITAL_COMMUNITY): Payer: Self-pay

## 2019-12-12 ENCOUNTER — Ambulatory Visit: Payer: Self-pay

## 2019-12-12 ENCOUNTER — Encounter: Payer: Self-pay | Admitting: Family Medicine

## 2019-12-12 ENCOUNTER — Other Ambulatory Visit: Payer: Self-pay

## 2019-12-12 ENCOUNTER — Ambulatory Visit (INDEPENDENT_AMBULATORY_CARE_PROVIDER_SITE_OTHER): Payer: Medicare HMO | Admitting: Family Medicine

## 2019-12-12 VITALS — BP 110/60 | HR 54 | Ht 68.0 in | Wt 139.6 lb

## 2019-12-12 DIAGNOSIS — M25561 Pain in right knee: Secondary | ICD-10-CM | POA: Diagnosis not present

## 2019-12-12 DIAGNOSIS — G8929 Other chronic pain: Secondary | ICD-10-CM

## 2019-12-12 MED ORDER — BLOOD GLUCOSE MONITOR KIT: PACK | 0 refills | Status: DC

## 2019-12-12 NOTE — Telephone Encounter (Signed)
Pt returned CR phone and stated he would like to participate in CR. Pt also stated he would like to use his [employer] to cover CR. Went over insurance, patient verbalized understanding.   Patient will come in for orientation on 01/01/20 @ 10AM and will attend the 9:15AM exercise class.  Mailed letter.

## 2019-12-12 NOTE — Patient Instructions (Signed)
Thank you for coming in today. Try the voltaren gel up to 4x daily.  Use the hinged knee brace as needed.  If not better let me know and I will get the process started to get the gel shots authorized as you cannot have a cortisone shot without a lot of risk.   Keep me updated.

## 2019-12-12 NOTE — Telephone Encounter (Signed)
Medicare is only going to pay for him to get 100 strips/ 3 months; essentially, he only needs to check once per day. There is nothing we can do about this.

## 2019-12-12 NOTE — Progress Notes (Signed)
Subjective:    CC: R knee pain  I, Troy Little, LAT, ATC, am serving as scribe for Dr. Lynne Little.  HPI: Pt is a 69 y/o male presenting w/ c/o R knee pain x 3 weeks w/ no known MOI.  He rates his pain at a 7/10 and describes his pain as sharp and aching.  Pt has a hx of 2 knee surgeries w/ the last one being in 1994 in [place].  He cannot think of any injury to explain his pain.  Pain is interfering with walking normally.  He recently had cardiac catheterization with drug-eluting stent in January.  He is on Plavix and cannot have surgery.  He also notes that he had a previous steroid injection which caused him to be hospitalized with hyperglycemia.  Normally his blood sugars in the 120s but he would like to avoid steroid injections if possible.  Radiating pain: yes into his R lower leg R knee swelling: Yes R knee mechanical symptoms:  Yes Aggravating factors: Going down stairs; walking; weight-bearing rotation Treatments tried: Tylenol; using a cane; tried a neoprene sleeve  Diagnostic imaging: R knee XR- 11/26/19  Pertinent review of Systems: No fevers or chills  Relevant historical information: Hypertension, CAD with recent drug-eluting stent placement.  History surgery as above 1994   Objective:    Vitals:   12/12/19 1000  BP: 110/60  Pulse: (!) 54  SpO2: 98%   General: Well Developed, well nourished, and in no acute distress.   MSK: Right knee No effusion decreased quad bulk.  No erythema. Range of motion 0-120 degrees. Mildly tender palpation medial joint line. Stable ligamentous exam. Mildly positive McMurray's test. Intact flexion extension strength.  Lab and Radiology Results  EXAM: RIGHT KNEE - COMPLETE 4+ VIEW  COMPARISON:  None.  FINDINGS: Mild medial and lateral joint space narrowing. Mild patellofemoral spur formation. Mild anterior patellar enthesophyte formation. Small effusion.  IMPRESSION: Mild tricompartmental degenerative changes and  small effusion.   Electronically Signed   By: Claudie Revering M.D.   On: 11/26/2019 16:30 I, Troy Little, personally (independently) visualized and performed the interpretation of the images attached in this note.  Diagnostic Limited MSK Ultrasound of: Right knee Tendon intact normal-appearing moderate effusion present. Normal patellar tendon. Medial joint line narrowed partially absent meniscus some bone spurring present. Lateral joint line possible linear tear posterior aspect of meniscus otherwise normal-appearing No Baker's cyst Impression: DJD probable meniscus tear   Impression and Recommendations:    Assessment and Plan: 69 y.o. male with right knee pain.  Pain due to either exacerbation of DJD or meniscus tear or some other internal derangement.  We discussed options.  Unfortunately he is not a good candidate for steroid injection given history of prior severe hyperglycemia.  Additionally is not a good candidate for systemic NSAIDs given his recent cardiac catheterization.  Plan for topical Voltaren gel and hinged knee brace.  If not sufficiently controlling symptoms we will work on authorization for hyaluronic acid injections.  His DJD should qualify for this treatment.    PDMP not reviewed this encounter. Orders Placed This Encounter  Procedures  . [place] LIMITED JOINT SPACE STRUCTURES LOW RIGHT(NO LINKED CHARGES)    Order Specific Question:   Reason for Exam (SYMPTOM  OR DIAGNOSIS REQUIRED)    Answer:   R knee pain    Order Specific Question:   Preferred imaging location?    Answer:   [place]   No orders of the  defined types were placed in this encounter.   Discussed warning signs or symptoms. Please see discharge instructions. Patient expresses understanding.   The above documentation has been reviewed and is accurate and complete Troy Little

## 2019-12-12 NOTE — Telephone Encounter (Signed)
lw 

## 2019-12-13 NOTE — Telephone Encounter (Signed)
Spoke with patient and info given 

## 2019-12-24 ENCOUNTER — Telehealth (HOSPITAL_COMMUNITY): Payer: Self-pay

## 2019-12-24 NOTE — Telephone Encounter (Signed)
Cardiac Rehab Medication Review by a Pharmacist  Does the patient  feel that his/her medications are working for him/her?  yes  Has the patient been experiencing any side effects to the medications prescribed?  no  Does the patient measure his/her own blood pressure or blood glucose at home?  yes Pt checks BP daily, typically low around 90/60s. Pt checks blood sugar three times a day, in the past week, the lowest is around 80s in the morning, and the highest is around 220 after eating. He states insurance only pays for 1 test strip per day and expresses frustration with that.   Does the patient have any problems obtaining medications due to transportation or finances?   no  Understanding of regimen: excellent Understanding of indications: excellent Potential of compliance: fair    Pharmacist comments: Pt does not have well controlled blood sugar but does not take metformin as instructed, also only on metformin 500 mg daily. Pt is a smoker, would benefit from smoking cessation. Pt is on simvastatin 20 mg and should be on a high-intensity statin like atorvastatin 40 or crestor 10, however statin therapy is being managed by the [place], and last LDL is at goal, so not an urgent issue.     Berenice Bouton, PharmD PGY1 Pharmacy Resident  Please check AMION for all [place] phone numbers After 10:00 PM, call [place] [phone]  12/24/2019 4:21 PM

## 2019-12-31 ENCOUNTER — Telehealth (HOSPITAL_COMMUNITY): Payer: Self-pay

## 2019-12-31 NOTE — Telephone Encounter (Signed)
Cardiac Rehab Note:  Unsuccessful telephone encounter to mr. Shyrl Numbers to confirm cardiac rehab orientation appointment for 01/01/20 at 10am. [employer] compliant VM message left requesting call back.  Latrina Guttman E. Rollene Rotunda RN, BSN [place]. [facility]  Cardiac and Pulmonary Rehabilitation Phone: [phone] Fax: [phone]

## 2020-01-01 ENCOUNTER — Ambulatory Visit (HOSPITAL_COMMUNITY): Payer: Medicare HMO

## 2020-01-01 ENCOUNTER — Telehealth (HOSPITAL_COMMUNITY): Payer: Self-pay

## 2020-01-01 NOTE — Telephone Encounter (Signed)
Pt called and cancel his orientation appt due to knee pain. Pt stated he will call back a later time to reschedule CR.  Closed referral.

## 2020-01-07 ENCOUNTER — Ambulatory Visit (HOSPITAL_COMMUNITY): Payer: Medicare HMO

## 2020-01-09 ENCOUNTER — Ambulatory Visit (HOSPITAL_COMMUNITY): Payer: Medicare HMO

## 2020-01-11 ENCOUNTER — Ambulatory Visit (HOSPITAL_COMMUNITY): Payer: Medicare HMO

## 2020-01-14 ENCOUNTER — Ambulatory Visit (HOSPITAL_COMMUNITY): Payer: Medicare HMO

## 2020-01-16 ENCOUNTER — Ambulatory Visit (HOSPITAL_COMMUNITY): Payer: Medicare HMO

## 2020-01-18 ENCOUNTER — Other Ambulatory Visit: Payer: Self-pay | Admitting: *Deleted

## 2020-01-18 ENCOUNTER — Ambulatory Visit (HOSPITAL_COMMUNITY): Payer: Medicare HMO

## 2020-01-18 DIAGNOSIS — F17210 Nicotine dependence, cigarettes, uncomplicated: Secondary | ICD-10-CM

## 2020-01-18 DIAGNOSIS — Z87891 Personal history of nicotine dependence: Secondary | ICD-10-CM

## 2020-01-21 ENCOUNTER — Ambulatory Visit (HOSPITAL_COMMUNITY): Payer: Medicare HMO

## 2020-01-23 ENCOUNTER — Ambulatory Visit (HOSPITAL_COMMUNITY): Payer: Medicare HMO

## 2020-01-25 ENCOUNTER — Ambulatory Visit (HOSPITAL_COMMUNITY): Payer: Medicare HMO

## 2020-01-28 ENCOUNTER — Ambulatory Visit (HOSPITAL_COMMUNITY): Payer: Medicare HMO

## 2020-01-30 ENCOUNTER — Ambulatory Visit (HOSPITAL_COMMUNITY): Payer: Medicare HMO

## 2020-02-01 ENCOUNTER — Ambulatory Visit (HOSPITAL_COMMUNITY): Payer: Medicare HMO

## 2020-02-04 ENCOUNTER — Ambulatory Visit (HOSPITAL_COMMUNITY): Payer: Medicare HMO

## 2020-02-06 ENCOUNTER — Ambulatory Visit (HOSPITAL_COMMUNITY): Payer: Medicare HMO

## 2020-02-08 ENCOUNTER — Ambulatory Visit (HOSPITAL_COMMUNITY): Payer: Medicare HMO

## 2020-02-11 ENCOUNTER — Ambulatory Visit: Payer: Medicare HMO | Admitting: Family

## 2020-02-11 ENCOUNTER — Ambulatory Visit (HOSPITAL_COMMUNITY): Payer: Medicare HMO

## 2020-02-13 ENCOUNTER — Ambulatory Visit (HOSPITAL_COMMUNITY): Payer: Medicare HMO

## 2020-02-15 ENCOUNTER — Ambulatory Visit (HOSPITAL_COMMUNITY): Payer: Medicare HMO

## 2020-02-20 ENCOUNTER — Ambulatory Visit (HOSPITAL_COMMUNITY): Payer: Medicare HMO

## 2020-02-22 ENCOUNTER — Ambulatory Visit (HOSPITAL_COMMUNITY): Payer: Medicare HMO

## 2020-02-25 ENCOUNTER — Ambulatory Visit: Payer: Medicare HMO | Admitting: Family

## 2020-02-25 ENCOUNTER — Ambulatory Visit (HOSPITAL_COMMUNITY): Payer: Medicare HMO

## 2020-02-27 ENCOUNTER — Ambulatory Visit (HOSPITAL_COMMUNITY): Payer: Medicare HMO

## 2020-02-29 ENCOUNTER — Ambulatory Visit (HOSPITAL_COMMUNITY): Payer: Medicare HMO

## 2020-03-09 NOTE — Progress Notes (Signed)
Cardiology Office Note   Date:  03/10/2020   ID:  Troy Little, Troy Little 1950-12-30, MRN [id number]  PCP:  Marrian Salvage, FNP    No chief complaint on file.  CAD  Wt Readings from Last 3 Encounters:  03/10/20 142 lb 12.8 oz (64.8 kg)  12/12/19 139 lb 9.6 oz (63.3 kg)  12/05/19 140 lb (63.5 kg)       History of Present Illness: Troy Little is a 69 y.o. male  A h/o CAD.  Recovered from alcohol and drug abuse.  COntinues to smoke.  Diagnostic cath was done at the [facility] and he needed PCI, which was done at [facility] in Jan 2021:  "The left ventricular systolic function is normal.  LV end diastolic pressure is low; left heart cath done due to low BP post sedation. Additional IV hydration was given for low BP  The left ventricular ejection fraction is 55-65% by visual estimate. LVEF checked since there was no recent LVEF on the chart and additional fluids were needed.  There is no aortic valve stenosis.  Prox RCA lesion is 90% stenosed.  A drug-eluting stent was successfully placed using a SYNERGY XD [id number], postdilated to 2.75 mm.  Post intervention, there is a 0% residual stenosis.  Hypotension resolved during the procedure.   Continue aspirin and Plavix.  He needs to stop smoking. "  He received his COVID vaccines.   Since the last visit, he has done well.  Denies : Chest pain. Dizziness. Leg edema. Nitroglycerin use. Orthopnea. Palpitations. Paroxysmal nocturnal dyspnea. Shortness of breath. Syncope.   Not walking much.  Rain has limited that activity as well.  He is interested in cardiac rehab.   Past Medical History:  Diagnosis Date  . Agoraphobia   . Anxiety   . Diabetes mellitus without complication ([place])   . Essential Tremor   . Hypercholesteremia   . MDD (major depressive disorder)   . Psychosis [facility])     Past Surgical History:  Procedure Laterality Date  . CHOLECYSTECTOMY    . CORONARY STENT INTERVENTION N/A 09/24/2019    Procedure: CORONARY STENT INTERVENTION;  Surgeon: Jettie Booze, MD;  Location: [place] CV LAB;  Service: Cardiovascular;  Laterality: N/A;  . LEFT HEART CATH N/A 09/24/2019   Procedure: Left Heart Cath;  Surgeon: Jettie Booze, MD;  Location: [place] CV LAB;  Service: Cardiovascular;  Laterality: N/A;     Current Outpatient Medications  Medication Sig Dispense Refill  . aspirin EC 81 MG tablet Take 81 mg by mouth daily.    . blood glucose meter kit and supplies KIT Dispense based on patient and insurance preference. Use up to four times daily as directed. (FOR ICD-9 250.00, 250.01). 1 each 0  . clopidogrel (PLAVIX) 75 MG tablet Take 1 tablet (75 mg total) by mouth daily. 90 tablet 3  . glucose blood test strip 1 each by Other route 3 (three) times daily as needed for other. Use as instructed 100 each 6  . HYDROcodone-acetaminophen (NORCO/VICODIN) 5-325 MG tablet Take 1 tablet by mouth every 6 (six) hours as needed for moderate pain.    Marland Kitchen lisinopril (ZESTRIL) 40 MG tablet Take 40 mg by mouth as needed.    . metFORMIN (GLUCOPHAGE) 500 MG tablet Take 1 tablet (500 mg total) by mouth daily with breakfast. Per patient, he takes 1/4 tablet per day "when he needs it"    . Omega-3 Fatty Acids (FISH OIL) 500 MG CAPS  Take 500 mg by mouth 3 (three) times daily.     . primidone (MYSOLINE) 50 MG tablet Take 1 tablet (50 mg total) by mouth 2 (two) times daily. Per patient, takes  1 1/2 tablet in the am and 1 tablet in the afternoon    . propranolol (INDERAL) 10 MG tablet Patient takes 1 1/2 tablet in the am, 1 in the afternoon, 1 in the evening    . simvastatin (ZOCOR) 20 MG tablet Take 20 mg by mouth at bedtime.      No current facility-administered medications for this visit.    Allergies:   Prozac [fluoxetine hcl] and Dye fdc red [red dye]    Social History:  The patient  reports that he has been smoking cigarettes. He has a 35.25 pack-year smoking history. He has never used  smokeless tobacco. He reports current alcohol use. He reports that he does not use drugs.   Family History:  The patient's family history includes Cancer in his mother.    ROS:  Please see the history of present illness.   Otherwise, review of systems are positive for less exercise; knee pain from bone spurs.   All other systems are reviewed and negative.    PHYSICAL EXAM: VS:  BP 124/62   Pulse (!) 58   Ht _0  (1.727 m)   Wt 142 lb 12.8 oz (64.8 kg)   SpO2 95%   BMI 21.71 kg/m  , BMI Body mass index is 21.71 kg/m. GEN: Well nourished, well developed, in no acute distress  HEENT: normal  Neck: no JVD, carotid bruits, or masses Cardiac: RRR; no murmurs, rubs, or gallops,no edema  Respiratory:  clear to auscultation bilaterally, normal work of breathing GI: soft, nontender, nondistended, + BS; thin MS: no deformity or atrophy  Skin: warm and dry, no rash Neuro:  Strength and sensation are intact Psych: euthymic mood, full affect   Recent Labs: 05/08/2019: ALT 23; TSH 1.00 09/24/2019: BUN 15; Creatinine, Ser 0.94; Hemoglobin 14.6; Platelets 185; Potassium 4.1; Sodium 137   Lipid Panel    Component Value Date/Time   CHOL 129 05/08/2019 1123   TRIG 167.0 (H) 05/08/2019 1123   HDL 48.40 05/08/2019 1123   CHOLHDL 3 05/08/2019 1123   VLDL 33.4 05/08/2019 1123   LDLCALC 47 05/08/2019 1123     Other studies Reviewed: Additional studies/ records that were reviewed today with results demonstrating: LDL 47 in 04/2019.   ASSESSMENT AND PLAN:  1. CAD: Continue aggressive secondary prevention.  No angina.  2. Hyperlipidemia: The current medical regimen is effective;  continue present plan and medications.  We discussed DAPT duration.  Continue DAPT for now.  Stop aspirin in Jan 2021.  3. HTN: The current medical regimen is effective;  continue present plan and medications.   4. DM2: Whole food, plant-based diet recommended.  A1C 6.7. 5. Tobacco abuse: He needs to stop smoking.  He  had patches at home that he did not use.  Cutting back.     Current medicines are reviewed at length with the patient today.  The patient concerns regarding his medicines were addressed.  The following changes have been made:  No change  Labs/ tests ordered today include:  No orders of the defined types were placed in this encounter.   Recommend 150 minutes/week of aerobic exercise Low fat, low carb, high fiber diet recommended  Disposition:   FU in 1 year from stent   Signed, Larae Grooms, MD  03/10/2020 1:32  PM    [facility] [address], [place], [place]  [address] Phone: [phone]; Fax: [phone]

## 2020-03-10 ENCOUNTER — Ambulatory Visit (INDEPENDENT_AMBULATORY_CARE_PROVIDER_SITE_OTHER): Payer: Medicare HMO | Admitting: Interventional Cardiology

## 2020-03-10 ENCOUNTER — Other Ambulatory Visit: Payer: Self-pay

## 2020-03-10 ENCOUNTER — Encounter (HOSPITAL_COMMUNITY): Payer: Self-pay | Admitting: *Deleted

## 2020-03-10 ENCOUNTER — Encounter: Payer: Self-pay | Admitting: Interventional Cardiology

## 2020-03-10 VITALS — BP 124/62 | HR 58 | Ht 68.0 in | Wt 142.8 lb

## 2020-03-10 DIAGNOSIS — Z720 Tobacco use: Secondary | ICD-10-CM

## 2020-03-10 DIAGNOSIS — I2510 Atherosclerotic heart disease of native coronary artery without angina pectoris: Secondary | ICD-10-CM | POA: Diagnosis not present

## 2020-03-10 DIAGNOSIS — E785 Hyperlipidemia, unspecified: Secondary | ICD-10-CM

## 2020-03-10 DIAGNOSIS — E1165 Type 2 diabetes mellitus with hyperglycemia: Secondary | ICD-10-CM | POA: Diagnosis not present

## 2020-03-10 DIAGNOSIS — I10 Essential (primary) hypertension: Secondary | ICD-10-CM | POA: Diagnosis not present

## 2020-03-10 NOTE — Progress Notes (Signed)
Received new referral for this pt to participate in cardiac rehab s/p DES to RCA on 09/24/19.  Pt previously scheduled for April but cancelled due to knee pain.  Pt seen in follow up 6/21, ok for support staff to verify insurance - [employer] medicare and schedule for cardiac rehab.  I do not see any VA authorization for this pt. Cherre Huger, BSN Cardiac and Training and development officer

## 2020-03-10 NOTE — Patient Instructions (Addendum)
Medication Instructions:  Your physician recommends that you continue on your current medications as directed. Please refer to the Current Medication list given to you today.  *If you need a refill on your cardiac medications before your next appointment, please call your pharmacy*   Lab Work: None ordered  If you have labs (blood work) drawn today and your tests are completely normal, you will receive your results only by: Marland Kitchen MyChart Message (if you have MyChart) OR . A paper copy in the mail If you have any lab test that is abnormal or we need to change your treatment, we will call you to review the results.   Testing/Procedures: None ordered   Follow-Up: At [facility], you and your health needs are our priority.  As part of our continuing mission to provide you with exceptional heart care, we have created designated Provider Care Teams.  These Care Teams include your primary Cardiologist (physician) and Advanced Practice Providers (APPs -  Physician Assistants and Nurse Practitioners) who all work together to provide you with the care you need, when you need it.  We recommend signing up for the patient portal called "MyChart".  Sign up information is provided on this After Visit Summary.  MyChart is used to connect with patients for Virtual Visits (Telemedicine).  Patients are able to view lab/test results, encounter notes, upcoming appointments, etc.  Non-urgent messages can be sent to your provider as well.   To learn more about what you can do with MyChart, go to [email or web address].    Your next appointment:   7 month(s)  The format for your next appointment:   In Person  Provider:   You may see Larae Grooms, MD or one of the following Advanced Practice Providers on your designated Care Team:    Melina Copa, PA-C  Ermalinda Barrios, PA-C    Other Instructions  High-Fiber Diet Fiber, also called dietary fiber, is a type of carbohydrate that is found in fruits,  vegetables, whole grains, and beans. A high-fiber diet can have many health benefits. Your health care provider may recommend a high-fiber diet to help:  Prevent constipation. Fiber can make your bowel movements more regular.  Lower your cholesterol.  Relieve the following conditions: ? Swelling of veins in the anus (hemorrhoids). ? Swelling and irritation (inflammation) of specific areas of the digestive tract (uncomplicated diverticulosis). ? A problem of the large intestine (colon) that sometimes causes pain and diarrhea (irritable bowel syndrome, IBS).  Prevent overeating as part of a weight-loss plan.  Prevent heart disease, type 2 diabetes, and certain cancers. What is my plan? The recommended daily fiber intake in grams (g) includes:  38 g for men age 80 or younger.  30 g for men over age 35.  17 g for women age 51 or younger.  21 g for women over age 61. You can get the recommended daily intake of dietary fiber by:  Eating a variety of fruits, vegetables, grains, and beans.  Taking a fiber supplement, if it is not possible to get enough fiber through your diet. What do I need to know about a high-fiber diet?  It is better to get fiber through food sources rather than from fiber supplements. There is not a lot of research about how effective supplements are.  Always check the fiber content on the nutrition facts label of any prepackaged food. Look for foods that contain 5 g of fiber or more per serving.  Talk with a diet and nutrition specialist (  dietitian) if you have questions about specific foods that are recommended or not recommended for your medical condition, especially if those foods are not listed below.  Gradually increase how much fiber you consume. If you increase your intake of dietary fiber too quickly, you may have bloating, cramping, or gas.  Drink plenty of water. Water helps you to digest fiber. What are tips for following this plan?  Eat a wide  variety of high-fiber foods.  Make sure that half of the grains that you eat each day are whole grains.  Eat breads and cereals that are made with whole-grain flour instead of refined flour or white flour.  Eat brown rice, bulgur wheat, or millet instead of white rice.  Start the day with a breakfast that is high in fiber, such as a cereal that contains 5 g of fiber or more per serving.  Use beans in place of meat in soups, salads, and pasta dishes.  Eat high-fiber snacks, such as berries, raw vegetables, nuts, and popcorn.  Choose whole fruits and vegetables instead of processed forms like juice or sauce. What foods can I eat?  Fruits Berries. Pears. Apples. Oranges. Avocado. Prunes and raisins. Dried figs. Vegetables Sweet potatoes. Spinach. Kale. Artichokes. Cabbage. Broccoli. Cauliflower. Green peas. Carrots. Squash. Grains Whole-grain breads. Multigrain cereal. Oats and oatmeal. Brown rice. Barley. Bulgur wheat. Millet. Quinoa. Bran muffins. Popcorn. Rye wafer crackers. Meats and other proteins Navy, kidney, and pinto beans. Soybeans. Split peas. Lentils. Nuts and seeds. Dairy Fiber-fortified yogurt. Beverages Fiber-fortified soy milk. Fiber-fortified orange juice. Other foods Fiber bars. The items listed above may not be a complete list of recommended foods and beverages. Contact a dietitian for more options. What foods are not recommended? Fruits Fruit juice. Cooked, strained fruit. Vegetables Fried potatoes. Canned vegetables. Well-cooked vegetables. Grains White bread. Pasta made with refined flour. White rice. Meats and other proteins Fatty cuts of meat. Fried chicken or fried fish. Dairy Milk. Yogurt. Cream cheese. Sour cream. Fats and oils Butters. Beverages Soft drinks. Other foods Cakes and pastries. The items listed above may not be a complete list of foods and beverages to avoid. Contact a dietitian for more information. Summary  Fiber is a type of  carbohydrate. It is found in fruits, vegetables, whole grains, and beans.  There are many health benefits of eating a high-fiber diet, such as preventing constipation, lowering blood cholesterol, helping with weight loss, and reducing your risk of heart disease, diabetes, and certain cancers.  Gradually increase your intake of fiber. Increasing too fast can result in cramping, bloating, and gas. Drink plenty of water while you increase your fiber.  The best sources of fiber include whole fruits and vegetables, whole grains, nuts, seeds, and beans. This information is not intended to replace advice given to you by your health care provider. Make sure you discuss any questions you have with your health care provider. Document Revised: 07/11/2017 Document Reviewed: 07/11/2017 Elsevier Patient Education  2020 Elsevier Inc.   

## 2020-03-11 ENCOUNTER — Encounter: Payer: Self-pay | Admitting: Family

## 2020-03-11 ENCOUNTER — Ambulatory Visit (INDEPENDENT_AMBULATORY_CARE_PROVIDER_SITE_OTHER): Payer: Medicare HMO | Admitting: Family

## 2020-03-11 ENCOUNTER — Telehealth (HOSPITAL_COMMUNITY): Payer: Self-pay

## 2020-03-11 VITALS — BP 120/62 | HR 48 | Temp 98.1°F | Wt 143.6 lb

## 2020-03-11 DIAGNOSIS — E1159 Type 2 diabetes mellitus with other circulatory complications: Secondary | ICD-10-CM

## 2020-03-11 DIAGNOSIS — L259 Unspecified contact dermatitis, unspecified cause: Secondary | ICD-10-CM

## 2020-03-11 MED ORDER — PREDNISONE 20 MG PO TABS: 20.0000 mg | ORAL_TABLET | Freq: Every day | ORAL | 0 refills | Status: DC

## 2020-03-11 MED ORDER — HYDROXYZINE HCL 10 MG PO TABS: 10.0000 mg | ORAL_TABLET | Freq: Three times a day (TID) | ORAL | 0 refills | Status: DC | PRN

## 2020-03-11 MED ORDER — FAMOTIDINE 20 MG PO TABS: 20.0000 mg | ORAL_TABLET | Freq: Two times a day (BID) | ORAL | 0 refills | Status: DC

## 2020-03-11 NOTE — Telephone Encounter (Signed)
Pt insurance is active and benefits verified through [facility]. Co-pay $10.00, DED $0.00/$0.00 met, out of pocket $3,900.00/$65.82 met, co-insurance 0%. No pre-authorization required. Passport, 03/11/20 @ 302pm, MCN#[id number]  Will contact patient to see if he is interested in the Cardiac Rehab Program.

## 2020-03-11 NOTE — Progress Notes (Signed)
Troy Little is a 69 y.o. male with the following history as recorded in [employer]:  Patient Active Problem List   Diagnosis Date Noted  . Angina pectoris ([place])   . Essential hypertension 01/22/2019  . Numbness 10/20/2018  . Hepatitis C antibody test positive 11/03/2015  . Diastolic dysfunction 51/76/1607  . Hyperlipidemia 11/01/2015  . Chronic neck pain 11/01/2015  . Leg weakness   . TIA (transient ischemic attack) 10/31/2015  . Type 2 diabetes mellitus with hyperglycemia, without long-term current use of insulin ([place]) 10/31/2015  . Anxiety and depression 10/31/2015  . Status post cholecystectomy 10/31/2015  . Tobacco use 10/31/2015  . Essential tremor 10/31/2015    Current Outpatient Medications  Medication Sig Dispense Refill  . aspirin EC 81 MG tablet Take 81 mg by mouth daily.    . clopidogrel (PLAVIX) 75 MG tablet Take 1 tablet (75 mg total) by mouth daily. 90 tablet 3  . glucose blood test strip 1 each by Other route 3 (three) times daily as needed for other. Use as instructed 100 each 6  . HYDROcodone-acetaminophen (NORCO/VICODIN) 5-325 MG tablet Take 1 tablet by mouth every 6 (six) hours as needed for moderate pain.    Marland Kitchen lisinopril (ZESTRIL) 40 MG tablet Take 40 mg by mouth as needed.    . metFORMIN (GLUCOPHAGE) 500 MG tablet Take 1 tablet (500 mg total) by mouth daily with breakfast. Per patient, he takes 1/4 tablet per day "when he needs it"    . Omega-3 Fatty Acids (FISH OIL) 500 MG CAPS Take 500 mg by mouth 3 (three) times daily.     . primidone (MYSOLINE) 50 MG tablet Take 1 tablet (50 mg total) by mouth 2 (two) times daily. Per patient, takes  1 1/2 tablet in the am and 1 tablet in the afternoon    . propranolol (INDERAL) 10 MG tablet Patient takes 1 1/2 tablet in the am, 1 in the afternoon, 1 in the evening    . simvastatin (ZOCOR) 20 MG tablet Take 20 mg by mouth at bedtime.     . famotidine (PEPCID) 20 MG tablet Take 1 tablet (20 mg total) by mouth 2 (two) times  daily. 30 tablet 0  . hydrOXYzine (ATARAX/VISTARIL) 10 MG tablet Take 1 tablet (10 mg total) by mouth 3 (three) times daily as needed. 30 tablet 0  . predniSONE (DELTASONE) 20 MG tablet Take 1 tablet (20 mg total) by mouth daily with breakfast. 5 tablet 0   No current facility-administered medications for this visit.    Allergies: Prozac [fluoxetine hcl] and Dye fdc red [red dye]  Past Medical History:  Diagnosis Date  . Agoraphobia   . Anxiety   . Diabetes mellitus without complication ([place])   . Essential Tremor   . Hypercholesteremia   . MDD (major depressive disorder)   . Psychosis [facility])     Past Surgical History:  Procedure Laterality Date  . CHOLECYSTECTOMY    . CORONARY STENT INTERVENTION N/A 09/24/2019   Procedure: CORONARY STENT INTERVENTION;  Surgeon: Jettie Booze, MD;  Location: [place] [facility];  Service: Cardiovascular;  Laterality: N/A;  . LEFT HEART CATH N/A 09/24/2019   Procedure: Left Heart Cath;  Surgeon: Jettie Booze, MD;  Location: [place] [facility];  Service: Cardiovascular;  Laterality: N/A;    Family History  Problem Relation Age of Onset  . Cancer Mother     Social History   Tobacco Use  . Smoking status: Current Every Day Smoker  Packs/day: 0.75    Years: 47.00    Pack years: 35.25    Types: Cigarettes  . Smokeless tobacco: Never Used  . Tobacco comment: plans on quitting after his current pack is completed.  Substance Use Topics  . Alcohol use: Yes    Subjective:  Patient notes that he was working in his yard and is suspicious that he is having allergic reaction to a plant; symptoms "on and off" for the past month; denies any new soaps, foods, detergents or medications.  Feels like he is having recurrent episodes of an 'itchy rash" on his arms and legs;   [facility]- per patient, they recently checked his hgba1c and most recent reading was 6.7;  Saw cardiology yesterday and in general is "feeling  very good."  Objective:  Vitals:   03/11/20 0935  BP: 120/62  Pulse: (!) 48  Temp: 98.1 F (36.7 C)  TempSrc: Oral  SpO2: 97%  Weight: 143 lb 9.6 oz (65.1 kg)    General: Well developed, well nourished, in no acute distress  Skin : Warm and dry. Localized areas of dry scaly skin noted on upper extremities bilaterally and lower right leg Head: Normocephalic and atraumatic  Eyes: Sclera and conjunctiva clear; pupils round and reactive to light; extraocular movements intact  Ears: External normal; canals clear; tympanic membranes normal  Oropharynx: Pink, supple. No suspicious lesions  Neck: Supple without thyromegaly, adenopathy  Lungs: Respirations unlabored;  Musculoskeletal: No deformities; no active joint inflammation  Extremities: No edema, cyanosis, clubbing  Vessels: Symmetric bilaterally  Neurologic: Alert and oriented; speech intact; face symmetrical; moves all extremities well; CNII-XII intact without focal deficit   Assessment:  1. Contact dermatitis, unspecified contact dermatitis type, unspecified trigger   2. Type 2 diabetes mellitus with other circulatory complication, without long-term current use of insulin (HCC)     Plan:  Symptoms have been present on and off for the past month; will treat for exposure to plant allergen with combination of Prednisone, Atarax and pepcid; follow-up worse, no better. [facility]- he understands the prednisone could cause his blood sugar to go up slightly.   This visit occurred during the SARS-CoV-2 public health emergency.  Safety protocols were in place, including screening questions prior to the visit, additional usage of staff PPE, and extensive cleaning of exam room while observing appropriate contact time as indicated for disinfecting solutions.      No follow-ups on file.  No orders of the defined types were placed in this encounter.   Requested Prescriptions   Signed Prescriptions Disp Refills  .  predniSONE (DELTASONE) 20 MG tablet 5 tablet 0    Sig: Take 1 tablet (20 mg total) by mouth daily with breakfast.  . hydrOXYzine (ATARAX/VISTARIL) 10 MG tablet 30 tablet 0    Sig: Take 1 tablet (10 mg total) by mouth 3 (three) times daily as needed.  . famotidine (PEPCID) 20 MG tablet 30 tablet 0    Sig: Take 1 tablet (20 mg total) by mouth 2 (two) times daily.

## 2020-03-11 NOTE — Telephone Encounter (Signed)
Called patient to see if he was interested in participating in the Cardiac Rehab Program. Patient stated yes. Patient will come in for orientation on 04/15/20 @ 930AM and will attend the 915AM exercise class.   Went over insurance, patient verbalized understanding.   Mailed letter.

## 2020-04-07 ENCOUNTER — Telehealth (HOSPITAL_COMMUNITY): Payer: Self-pay | Admitting: Pharmacist

## 2020-04-10 ENCOUNTER — Other Ambulatory Visit: Payer: Self-pay | Admitting: Family

## 2020-04-10 NOTE — Telephone Encounter (Signed)
New message:   1.Medication Requested: Allergy medication: Pt states he through the bottle away 2. Pharmacy (Name, Street, [place]): [place], [place]. 3. On Med List: Yes  4. Last Visit with PCP: 03/11/20  5. Next visit date with PCP: None   Agent: Please be advised that RX refills may take up to 3 business days. We ask that you follow-up with your pharmacy.

## 2020-04-10 NOTE — Telephone Encounter (Signed)
Which medication is he needing

## 2020-04-11 ENCOUNTER — Encounter (HOSPITAL_COMMUNITY)
Admission: RE | Admit: 2020-04-11 | Discharge: 2020-04-11 | Disposition: A | Discharge status: Home / Self Care | Payer: Medicare HMO | Source: Ambulatory Visit | Attending: Interventional Cardiology | Admitting: Interventional Cardiology

## 2020-04-11 ENCOUNTER — Telehealth (HOSPITAL_COMMUNITY): Payer: Self-pay | Admitting: *Deleted

## 2020-04-11 ENCOUNTER — Other Ambulatory Visit: Payer: Self-pay

## 2020-04-11 DIAGNOSIS — Z955 Presence of coronary angioplasty implant and graft: Secondary | ICD-10-CM | POA: Insufficient documentation

## 2020-04-11 MED ORDER — HYDROXYZINE HCL 10 MG PO TABS: 10.0000 mg | ORAL_TABLET | Freq: Three times a day (TID) | ORAL | 0 refills | Status: DC | PRN

## 2020-04-11 NOTE — Telephone Encounter (Signed)
New message:   Pt is calling back and would like to know the status of his allergy medication refill. Pt states he still can't remember the name of the medication. Please advise.

## 2020-04-11 NOTE — Telephone Encounter (Signed)
Spoke with Troy Little. Confirmed orientation appointment. Completed health history via telephone.Barnet Pall, RN,BSN 04/11/2020 3:09 PM

## 2020-04-11 NOTE — Telephone Encounter (Signed)
Left message to call cardiac rehab regarding orientation appointment.Barnet Pall, RN,BSN 04/11/2020 2:42 PM

## 2020-04-11 NOTE — Telephone Encounter (Signed)
1.Medication Requested:hydrOXYzine (ATARAX/VISTARIL) 10 MG tablet  2. Pharmacy (Name, Street, [place]):[facility], [place]  3. On Med List: Yes   4. Last Visit with PCP:  6.22.2021   5. Next visit date with PCP: n/a   Agent: Please be advised that RX refills may take up to 3 business days. We ask that you follow-up with your pharmacy.

## 2020-04-14 ENCOUNTER — Other Ambulatory Visit: Payer: Self-pay | Admitting: Family

## 2020-04-15 ENCOUNTER — Encounter (HOSPITAL_COMMUNITY)
Admission: RE | Admit: 2020-04-15 | Discharge: 2020-04-15 | Disposition: A | Discharge status: Home / Self Care | Payer: Medicare HMO | Source: Ambulatory Visit | Attending: Interventional Cardiology | Admitting: Interventional Cardiology

## 2020-04-15 ENCOUNTER — Other Ambulatory Visit: Payer: Self-pay

## 2020-04-15 ENCOUNTER — Encounter (HOSPITAL_COMMUNITY): Payer: Self-pay

## 2020-04-15 VITALS — BP 108/60 | HR 62 | Ht 67.5 in | Wt 141.1 lb

## 2020-04-15 DIAGNOSIS — Z955 Presence of coronary angioplasty implant and graft: Secondary | ICD-10-CM

## 2020-04-15 HISTORY — DX: Atherosclerotic heart disease of native coronary artery without angina pectoris: I25.10

## 2020-04-15 NOTE — Progress Notes (Signed)
Cardiac Rehab Medication Review by a Nurse  Does the patient  feel that his/her medications are working for him/her?  yes  Has the patient been experiencing any side effects to the medications prescribed?  no  Does the patient measure his/her own blood pressure or blood glucose at home?  yes   Does the patient have any problems obtaining medications due to transportation or finances?   no  Understanding of regimen: good Understanding of indications: good Potential of compliance: good    Nurse comments: Troy Little is taking his taking his medications as prescribed upon review.    [name] RN BSN 04/15/2020 12:29 PM

## 2020-04-15 NOTE — Progress Notes (Signed)
Cardiac Individual Treatment Plan  Patient Details  Name: Troy Little MRN: [id number] Date of Birth: [birth year] Referring Provider:     CARDIAC REHAB PHASE II ORIENTATION from 04/15/2020 in [facility]  Referring Provider Jettie Booze, MD      Initial Encounter Date:    CARDIAC REHAB PHASE II ORIENTATION from 04/15/2020 in [facility]  Date 04/15/20      Visit Diagnosis: 09/24/19 DES RCA  Patient's Home Medications on Admission:  Current Outpatient Medications:  .  aspirin EC 81 MG tablet, Take 81 mg by mouth daily., Disp: , Rfl:  .  clopidogrel (PLAVIX) 75 MG tablet, Take 1 tablet (75 mg total) by mouth daily., Disp: 90 tablet, Rfl: 3 .  famotidine (PEPCID) 20 MG tablet, Take 1 tablet (20 mg total) by mouth 2 (two) times daily., Disp: 30 tablet, Rfl: 0 .  glucose blood test strip, 1 each by Other route 3 (three) times daily as needed for other. Use as instructed, Disp: 100 each, Rfl: 6 .  HYDROcodone-acetaminophen (NORCO/VICODIN) 5-325 MG tablet, Take 1 tablet by mouth every 6 (six) hours as needed for moderate pain., Disp: , Rfl:  .  hydrOXYzine (ATARAX/VISTARIL) 10 MG tablet, Take 1 tablet (10 mg total) by mouth 3 (three) times daily as needed., Disp: 30 tablet, Rfl: 0 .  lisinopril (ZESTRIL) 40 MG tablet, Take 1 tablet by mouth once daily, Disp: 90 tablet, Rfl: 0 .  metFORMIN (GLUCOPHAGE) 500 MG tablet, Take 1 tablet (500 mg total) by mouth daily with breakfast. Per patient, he takes 1/4 tablet per day "when he needs it", Disp: , Rfl:  .  Omega-3 Fatty Acids (FISH OIL) 500 MG CAPS, Take 500 mg by mouth 3 (three) times daily. , Disp: , Rfl:  .  primidone (MYSOLINE) 50 MG tablet, Take 1 tablet (50 mg total) by mouth 2 (two) times daily. Per patient, takes  1 1/2 tablet in the am and 1 tablet in the afternoon, Disp: , Rfl:  .  propranolol (INDERAL) 10 MG tablet, Patient takes 1 1/2 tablet in the am, 1 in the  afternoon, 1 in the evening, Disp: , Rfl:  .  simvastatin (ZOCOR) 20 MG tablet, Take 20 mg by mouth at bedtime. , Disp: , Rfl:  .  predniSONE (DELTASONE) 20 MG tablet, Take 1 tablet (20 mg total) by mouth daily with breakfast., Disp: 5 tablet, Rfl: 0  Past Medical History: Past Medical History:  Diagnosis Date  . Agoraphobia   . Anxiety   . Coronary artery disease   . Diabetes mellitus without complication ([place])   . Essential Tremor   . Hypercholesteremia   . MDD (major depressive disorder)   . Psychosis ([place])     Tobacco Use: Social History   Tobacco Use  Smoking Status Current Every Day Smoker  . Packs/day: 0.50  . Years: 47.00  . Pack years: 23.50  . Types: Cigarettes  Smokeless Tobacco Never Used  Tobacco Comment   Patient given 1-800-quit-now for smoking cessation    Labs: Recent Review Flowsheet Data    Labs for ITP Cardiac and Pulmonary Rehab Latest Ref Rng & Units 03/29/2008 11/24/2012 10/31/2015 10/03/2018 05/08/2019   Cholestrol 0 - 200 mg/dL - - 188 127 129   LDLCALC 0 - 99 mg/dL - - 111(H) 43 47   HDL >39.00 mg/dL - - 45 49.00 48.40   Trlycerides 0 - 149 mg/dL - - 160(H) 178.0(H) 167.0(H)  Hemoglobin A1c 4.6 - 6.5 % - - 10.5(H) 7.2(H) 6.7(H)   TCO2 0 - 100 mmol/L 24 27 24  - -      Capillary Blood Glucose: Lab Results  Component Value Date   GLUCAP 95 09/24/2019   GLUCAP 152 (H) 09/24/2019   GLUCAP 158 (H) 12/08/2016   GLUCAP 257 (H) 11/01/2015   GLUCAP 230 (H) 11/01/2015     Exercise Target Goals: Exercise Program Goal: Individual exercise prescription set using results from initial 6 min walk test and THRR while considering  patient's activity barriers and safety.   Exercise Prescription Goal: Starting with aerobic activity 30 plus minutes a day, 3 days per week for initial exercise prescription. Provide home exercise prescription and guidelines that participant acknowledges understanding prior to discharge.  Activity Barriers & Risk  Stratification:  Activity Barriers & Cardiac Risk Stratification - 04/15/20 0954      Activity Barriers & Cardiac Risk Stratification   Activity Barriers Arthritis;Other (comment)    Comments History of multiple right knee surgeries    Cardiac Risk Stratification High           6 Minute Walk:  6 Minute Walk    Row Name 04/15/20 1009         6 Minute Walk   Phase Initial     Distance 1488 feet     Walk Time 6 minutes     # of Rest Breaks 0     MPH 2.82     METS 3.55     RPE 11     Perceived Dyspnea  0     VO2 Peak 12.44     Symptoms Yes (comment)     Comments Right knee pain, 3/10 on the pain scale.     Resting HR 62 bpm     Resting BP 108/60     Resting Oxygen Saturation  99 %     Exercise Oxygen Saturation  during 6 min walk 98 %     Max Ex. HR 81 bpm     Max Ex. BP 122/72     2 Minute Post BP 118/70            Oxygen Initial Assessment:   Oxygen Re-Evaluation:   Oxygen Discharge (Final Oxygen Re-Evaluation):   Initial Exercise Prescription:  Initial Exercise Prescription - 04/15/20 1200      Date of Initial Exercise RX and Referring Provider   Date 04/15/20    Referring Provider Jettie Booze, MD    Expected Discharge Date 06/13/20      NuStep   Level 2    SPM 85    Minutes 15    METs 2.5      Arm Ergometer   Level 2    Watts 20    Minutes 15    METs 2.61      Prescription Details   Frequency (times per week) 3    Duration Progress to 30 minutes of continuous aerobic without signs/symptoms of physical distress      Intensity   THRR 40-80% of Max Heartrate 62-123    Ratings of Perceived Exertion 11-13    Perceived Dyspnea 0-4      Progression   Progression Continue to progress workloads to maintain intensity without signs/symptoms of physical distress.      Resistance Training   Training Prescription Yes    Weight 3lbs    Reps 10-15           Perform Capillary Blood  Glucose checks as needed.  Exercise Prescription  Changes:   Exercise Comments:   Exercise Goals and Review:  Exercise Goals    Row Name 04/15/20 0956             Exercise Goals   Increase Physical Activity Yes       Intervention Provide advice, education, support and counseling about physical activity/exercise needs.;Develop an individualized exercise prescription for aerobic and resistive training based on initial evaluation findings, risk stratification, comorbidities and participant's personal goals.       Expected Outcomes Short Term: Attend rehab on a regular basis to increase amount of physical activity.;Long Term: Exercising regularly at least 3-5 days a week.;Long Term: Add in home exercise to make exercise part of routine and to increase amount of physical activity.       Increase Strength and Stamina Yes       Intervention Provide advice, education, support and counseling about physical activity/exercise needs.;Develop an individualized exercise prescription for aerobic and resistive training based on initial evaluation findings, risk stratification, comorbidities and participant's personal goals.       Expected Outcomes Short Term: Increase workloads from initial exercise prescription for resistance, speed, and METs.;Short Term: Perform resistance training exercises routinely during rehab and add in resistance training at home;Long Term: Improve cardiorespiratory fitness, muscular endurance and strength as measured by increased METs and functional capacity (6MWT)       Able to understand and use rate of perceived exertion (RPE) scale Yes       Intervention Provide education and explanation on how to use RPE scale       Expected Outcomes Short Term: Able to use RPE daily in rehab to express subjective intensity level;Long Term:  Able to use RPE to guide intensity level when exercising independently       Knowledge and understanding of Target Heart Rate Range (THRR) Yes       Intervention Provide education and explanation of THRR  including how the numbers were predicted and where they are located for reference       Expected Outcomes Short Term: Able to state/look up THRR;Short Term: Able to use daily as guideline for intensity in rehab;Long Term: Able to use THRR to govern intensity when exercising independently       Able to check pulse independently Yes       Intervention Provide education and demonstration on how to check pulse in carotid and radial arteries.;Review the importance of being able to check your own pulse for safety during independent exercise       Expected Outcomes Short Term: Able to explain why pulse checking is important during independent exercise;Long Term: Able to check pulse independently and accurately       Understanding of Exercise Prescription Yes       Intervention Provide education, explanation, and written materials on patient's individual exercise prescription       Expected Outcomes Short Term: Able to explain program exercise prescription;Long Term: Able to explain home exercise prescription to exercise independently              Exercise Goals Re-Evaluation :    Discharge Exercise Prescription (Final Exercise Prescription Changes):   Nutrition:  Target Goals: Understanding of nutrition guidelines, daily intake of sodium 1500mg , cholesterol 200mg , calories 30% from fat and 7% or less from saturated fats, daily to have 5 or more servings of fruits and vegetables.  Biometrics:  Pre Biometrics - 04/15/20 9169      Pre  Biometrics   Waist Circumference 30.5 inches    Hip Circumference 35.25 inches    Waist to Hip Ratio 0.87 %    Triceps Skinfold 5 mm    % Body Fat 16.5 %    Grip Strength 37 kg    Flexibility --   N/A: knee and low back pain.   Single Leg Stand 5.41 seconds            Nutrition Therapy Plan and Nutrition Goals:   Nutrition Assessments:   Nutrition Goals Re-Evaluation:   Nutrition Goals Discharge (Final Nutrition Goals  Re-Evaluation):   Psychosocial: Target Goals: Acknowledge presence or absence of significant depression and/or stress, maximize coping skills, provide positive support system. Participant is able to verbalize types and ability to use techniques and skills needed for reducing stress and depression.  Initial Review & Psychosocial Screening:  Initial Psych Review & Screening - 04/15/20 1241      Initial Review   Current issues with History of Depression      Family Dynamics   Good Support System? --   Kashton has his wife for support     Barriers   Psychosocial barriers to participate in program There are no identifiable barriers or psychosocial needs.      Screening Interventions   Interventions Encouraged to exercise           Quality of Life Scores:  Quality of Life - 04/15/20 1157      Quality of Life   Select Quality of Life      Quality of Life Scores   Health/Function Pre 24.8 %    Socioeconomic Pre 21.6 %    Psych/Spiritual Pre 24 %    Family Pre 22.5 %    GLOBAL Pre 23.81 %          Scores of 19 and below usually indicate a poorer quality of life in these areas.  A difference of  2-3 points is a clinically meaningful difference.  A difference of 2-3 points in the total score of the Quality of Life Index has been associated with significant improvement in overall quality of life, self-image, physical symptoms, and general health in studies assessing change in quality of life.  PHQ-9: Recent Review Flowsheet Data    Depression screen [facility] 352 2/9 04/15/2020   Decreased Interest 0   Down, Depressed, Hopeless 0   PHQ - 2 Score 0     Interpretation of Total Score  Total Score Depression Severity:  1-4 = Minimal depression, 5-9 = Mild depression, 10-14 = Moderate depression, 15-19 = Moderately severe depression, 20-27 = Severe depression   Psychosocial Evaluation and Intervention:   Psychosocial Re-Evaluation:   Psychosocial Discharge (Final Psychosocial  Re-Evaluation):   Vocational Rehabilitation: Provide vocational rehab assistance to qualifying candidates.   Vocational Rehab Evaluation & Intervention:  Vocational Rehab - 04/15/20 1244      Initial Vocational Rehab Evaluation & Intervention   Assessment shows need for Vocational Rehabilitation No   Donevin is disabled and does not need vocational rehab at this time          Education: Education Goals: Education classes will be provided on a weekly basis, covering required topics. Participant will state understanding/return demonstration of topics presented.  Learning Barriers/Preferences:  Learning Barriers/Preferences - 04/15/20 1243      Learning Barriers/Preferences   Learning Barriers Reading   wears reading glasses.   Learning Preferences Pictoral;Video;Audio;Verbal Instruction;Skilled Demonstration           Education  Topics: Hypertension, Hypertension Reduction -Define heart disease and high blood pressure. Discus how high blood pressure affects the body and ways to reduce high blood pressure.   Exercise and Your Heart -Discuss why it is important to exercise, the FITT principles of exercise, normal and abnormal responses to exercise, and how to exercise safely.   Angina -Discuss definition of angina, causes of angina, treatment of angina, and how to decrease risk of having angina.   Cardiac Medications -Review what the following cardiac medications are used for, how they affect the body, and side effects that may occur when taking the medications.  Medications include Aspirin, Beta blockers, calcium channel blockers, ACE Inhibitors, angiotensin receptor blockers, diuretics, digoxin, and antihyperlipidemics.   Congestive Heart Failure -Discuss the definition of CHF, how to live with CHF, the signs and symptoms of CHF, and how keep track of weight and sodium intake.   Heart Disease and Intimacy -Discus the effect sexual activity has on the heart, how changes  occur during intimacy as we age, and safety during sexual activity.   Smoking Cessation / COPD -Discuss different methods to quit smoking, the health benefits of quitting smoking, and the definition of COPD.   Nutrition I: Fats -Discuss the types of cholesterol, what cholesterol does to the heart, and how cholesterol levels can be controlled.   Nutrition II: Labels -Discuss the different components of food labels and how to read food label   Heart Parts/Heart Disease and PAD -Discuss the anatomy of the heart, the pathway of blood circulation through the heart, and these are affected by heart disease.   Stress I: Signs and Symptoms -Discuss the causes of stress, how stress may lead to anxiety and depression, and ways to limit stress.   Stress II: Relaxation -Discuss different types of relaxation techniques to limit stress.   Warning Signs of Stroke / TIA -Discuss definition of a stroke, what the signs and symptoms are of a stroke, and how to identify when someone is having stroke.   Knowledge Questionnaire Score:  Knowledge Questionnaire Score - 04/15/20 1139      Knowledge Questionnaire Score   Pre Score 22/28           Core Components/Risk Factors/Patient Goals at Admission:  Personal Goals and Risk Factors at Admission - 04/15/20 1158      Core Components/Risk Factors/Patient Goals on Admission    Weight Management Yes;Weight Gain    Intervention Weight Management: Develop a combined nutrition and exercise program designed to reach desired caloric intake, while maintaining appropriate intake of nutrient and fiber, sodium and fats, and appropriate energy expenditure required for the weight goal.;Weight Management: Provide education and appropriate resources to help participant work on and attain dietary goals.    Admit Weight 141 lb 1.5 oz (64 kg)    Goal Weight: Short Term 146 lb (66.2 kg)    Goal Weight: Long Term 151 lb (68.5 kg)    Expected Outcomes Short Term:  Continue to assess and modify interventions until short term weight is achieved;Long Term: Adherence to nutrition and physical activity/exercise program aimed toward attainment of established weight goal;Understanding recommendations for meals to include 15-35% energy as protein, 25-35% energy from fat, 35-60% energy from carbohydrates, less than 200mg  of dietary cholesterol, 20-35 gm of total fiber daily;Understanding of distribution of calorie intake throughout the day with the consumption of 4-5 meals/snacks;Weight Gain: Understanding of general recommendations for a high calorie, high protein meal plan that promotes weight gain by distributing calorie intake throughout  the day with the consumption for 4-5 meals, snacks, and/or supplements    Tobacco Cessation Yes    Number of packs per day 0.5    Intervention Assist the participant in steps to quit. Provide individualized education and counseling about committing to Tobacco Cessation, relapse prevention, and pharmacological support that can be provided by physician.;Advice worker, assist with locating and accessing local/national Quit Smoking programs, and support quit date choice.    Expected Outcomes Short Term: Will demonstrate readiness to quit, by selecting a quit date.;Long Term: Complete abstinence from all tobacco products for at least 12 months from quit date.;Short Term: Will quit all tobacco product use, adhering to prevention of relapse plan.    Diabetes Yes    Intervention Provide education about signs/symptoms and action to take for hypo/hyperglycemia.;Provide education about proper nutrition, including hydration, and aerobic/resistive exercise prescription along with prescribed medications to achieve blood glucose in normal ranges: Fasting glucose 65-99 mg/dL    Expected Outcomes Short Term: Participant verbalizes understanding of the signs/symptoms and immediate care of hyper/hypoglycemia, proper foot care and importance of  medication, aerobic/resistive exercise and nutrition plan for blood glucose control.;Long Term: Attainment of HbA1C < 7%.    Hypertension Yes    Intervention Provide education on lifestyle modifcations including regular physical activity/exercise, weight management, moderate sodium restriction and increased consumption of fresh fruit, vegetables, and low fat dairy, alcohol moderation, and smoking cessation.;Monitor prescription use compliance.    Expected Outcomes Short Term: Continued assessment and intervention until BP is < 140/33mm HG in hypertensive participants. < 130/47mm HG in hypertensive participants with diabetes, heart failure or chronic kidney disease.;Long Term: Maintenance of blood pressure at goal levels.    Lipids Yes    Intervention Provide education and support for participant on nutrition & aerobic/resistive exercise along with prescribed medications to achieve LDL 70mg , HDL >40mg .    Expected Outcomes Short Term: Participant states understanding of desired cholesterol values and is compliant with medications prescribed. Participant is following exercise prescription and nutrition guidelines.;Long Term: Cholesterol controlled with medications as prescribed, with individualized exercise RX and with personalized nutrition plan. Value goals: LDL < 70mg , HDL > 40 mg.           Core Components/Risk Factors/Patient Goals Review:    Core Components/Risk Factors/Patient Goals at Discharge (Final Review):    ITP Comments:  ITP Comments    Row Name 04/15/20 0958           ITP Comments Medical Director- Dr. Fransico Him, MD              Comments: Vallie attended orientation on 04/15/2020 to review rules and guidelines for program.  Completed 6 minute walk test, Intitial ITP, and exercise prescription.  VSS. Telemetry-Sinus brady, Sinus Rhythm this has been previously documented.  Kohl did complain of right knee pain otherwise Asymptomatic. Safety measures and social distancing  in place per CDC guidelines.Barnet Pall, RN,BSN 04/15/2020 12:51 PM

## 2020-04-21 ENCOUNTER — Telehealth (HOSPITAL_COMMUNITY): Payer: Self-pay

## 2020-04-21 ENCOUNTER — Ambulatory Visit (HOSPITAL_COMMUNITY): Payer: Medicare HMO

## 2020-04-21 NOTE — Telephone Encounter (Addendum)
pt called and stated that he wanted to cancel his cardiac rehab sessions because of the covid delta. he said he will call back to sch at a later time. Contacted Verdis Frederickson RN and Pharmacologist to let them know.

## 2020-04-23 ENCOUNTER — Ambulatory Visit: Payer: Medicare HMO | Admitting: Internal Medicine

## 2020-04-23 ENCOUNTER — Ambulatory Visit (HOSPITAL_COMMUNITY): Payer: Medicare HMO

## 2020-04-25 ENCOUNTER — Ambulatory Visit (INDEPENDENT_AMBULATORY_CARE_PROVIDER_SITE_OTHER): Payer: Medicare HMO | Admitting: Internal Medicine

## 2020-04-25 ENCOUNTER — Ambulatory Visit (HOSPITAL_COMMUNITY): Payer: Medicare HMO

## 2020-04-25 ENCOUNTER — Encounter: Payer: Self-pay | Admitting: Internal Medicine

## 2020-04-25 ENCOUNTER — Other Ambulatory Visit: Payer: Self-pay

## 2020-04-25 VITALS — BP 118/78 | HR 55 | Temp 98.8°F | Ht 67.5 in | Wt 143.0 lb

## 2020-04-25 DIAGNOSIS — L259 Unspecified contact dermatitis, unspecified cause: Secondary | ICD-10-CM

## 2020-04-25 DIAGNOSIS — R221 Localized swelling, mass and lump, neck: Secondary | ICD-10-CM

## 2020-04-25 MED ORDER — TRIAMCINOLONE ACETONIDE 0.1 % EX CREA: 1.0000 "application " | TOPICAL_CREAM | Freq: Two times a day (BID) | CUTANEOUS | 2 refills | Status: AC

## 2020-04-25 MED ORDER — HYDROXYZINE HCL 10 MG PO TABS: 10.0000 mg | ORAL_TABLET | Freq: Three times a day (TID) | ORAL | 0 refills | Status: DC | PRN

## 2020-04-25 MED ORDER — FREESTYLE LITE TEST VI STRP: 1.0000 | ORAL_STRIP | Freq: Three times a day (TID) | 12 refills | Status: DC

## 2020-04-25 NOTE — Assessment & Plan Note (Signed)
Refill triamcinolone and hydroxyzine.

## 2020-04-25 NOTE — Assessment & Plan Note (Signed)
LN appears to be resolving on exam so reassurance given. No signs of URI or sinus infection or dental infection on exam.

## 2020-04-25 NOTE — Patient Instructions (Signed)
We have sent in the refill of the cream to use for the rash.   We have refilled the hydroxyzine.  We have sent in the strips.

## 2020-04-25 NOTE — Progress Notes (Signed)
   Subjective:   Patient ID: Troy Little, male    DOB: August 30, 1951, 69 y.o.   MRN: [id number]  HPI The patient is a 69 YO man coming in for swollen lymph node on the left neck. Started Monday and has improved since then. Woke up with swollen and painful LN. Whole family has recently had RSV. Does have chronic cough due to smoking which he denies is different than usual. Denies sinus drainage problems. Has a crown which he is awaiting but due to delta variant does not want to do at this time. History of hep c year ago went through treatment and is resolved for a long time. Denies fevers or chills. Denies weight change or night sweats. Has also a rash which is resolving but needs refill on his cream and itching medicine.   Review of Systems  Constitutional: Negative.   HENT: Negative.        LN left neck  Eyes: Negative.   Respiratory: Positive for cough. Negative for chest tightness and shortness of breath.        Chronic and stable  Cardiovascular: Negative for chest pain, palpitations and leg swelling.  Gastrointestinal: Negative for abdominal distention, abdominal pain, constipation, diarrhea, nausea and vomiting.  Musculoskeletal: Negative.   Skin: Positive for rash.       itching  Neurological: Negative.   Psychiatric/Behavioral: Negative.     Objective:  Physical Exam Constitutional:      Appearance: He is well-developed.  HENT:     Head: Normocephalic and atraumatic.  Neck:     Comments: LN appreciable but <81mm and fully mobile, benign appearing Cardiovascular:     Rate and Rhythm: Normal rate and regular rhythm.  Pulmonary:     Effort: Pulmonary effort is normal. No respiratory distress.     Breath sounds: Normal breath sounds. No wheezing or rales.  Abdominal:     General: Bowel sounds are normal. There is no distension.     Palpations: Abdomen is soft.     Tenderness: There is no abdominal tenderness. There is no rebound.  Musculoskeletal:     Cervical back:  Normal range of motion.  Skin:    General: Skin is warm and dry.  Neurological:     Mental Status: He is alert and oriented to person, place, and time.     Coordination: Coordination normal.     Vitals:   04/25/20 1012  BP: 118/78  Pulse: (!) 55  Temp: 98.8 F (37.1 C)  TempSrc: Oral  SpO2: 99%  Weight: 143 lb (64.9 kg)  Height: 5' 7.5" (1.715 m)    This visit occurred during the SARS-CoV-2 public health emergency.  Safety protocols were in place, including screening questions prior to the visit, additional usage of staff PPE, and extensive cleaning of exam room while observing appropriate contact time as indicated for disinfecting solutions.   Assessment & Plan:

## 2020-04-28 ENCOUNTER — Ambulatory Visit (HOSPITAL_COMMUNITY): Payer: Medicare HMO

## 2020-04-30 ENCOUNTER — Ambulatory Visit (HOSPITAL_COMMUNITY): Payer: Medicare HMO

## 2020-05-02 ENCOUNTER — Ambulatory Visit (HOSPITAL_COMMUNITY): Payer: Medicare HMO

## 2020-05-05 ENCOUNTER — Ambulatory Visit (HOSPITAL_COMMUNITY): Payer: Medicare HMO

## 2020-05-07 ENCOUNTER — Ambulatory Visit (HOSPITAL_COMMUNITY): Payer: Medicare HMO

## 2020-05-09 ENCOUNTER — Ambulatory Visit (HOSPITAL_COMMUNITY): Payer: Medicare HMO

## 2020-05-12 ENCOUNTER — Ambulatory Visit (HOSPITAL_COMMUNITY): Payer: Medicare HMO

## 2020-05-14 ENCOUNTER — Ambulatory Visit (HOSPITAL_COMMUNITY): Payer: Medicare HMO

## 2020-05-16 ENCOUNTER — Ambulatory Visit (HOSPITAL_COMMUNITY): Payer: Medicare HMO

## 2020-05-19 ENCOUNTER — Ambulatory Visit (HOSPITAL_COMMUNITY): Payer: Medicare HMO

## 2020-05-21 ENCOUNTER — Ambulatory Visit (HOSPITAL_COMMUNITY): Payer: Medicare HMO

## 2020-05-21 ENCOUNTER — Telehealth: Payer: Self-pay | Admitting: Family

## 2020-05-21 NOTE — Telephone Encounter (Signed)
   Patient states pharmacy needs diagnosis code, before they will dispense test strips. Please call pharmacy [phone]

## 2020-05-22 ENCOUNTER — Other Ambulatory Visit: Payer: Self-pay

## 2020-05-22 DIAGNOSIS — E1165 Type 2 diabetes mellitus with hyperglycemia: Secondary | ICD-10-CM

## 2020-05-22 MED ORDER — FREESTYLE LITE TEST VI STRP: 1.0000 | ORAL_STRIP | Freq: Three times a day (TID) | 12 refills | Status: DC

## 2020-05-22 NOTE — Telephone Encounter (Signed)
Refaxed order today with dx code

## 2020-05-23 ENCOUNTER — Ambulatory Visit (HOSPITAL_COMMUNITY): Payer: Medicare HMO

## 2020-05-28 ENCOUNTER — Ambulatory Visit (HOSPITAL_COMMUNITY): Payer: Medicare HMO

## 2020-05-30 ENCOUNTER — Ambulatory Visit (HOSPITAL_COMMUNITY): Payer: Medicare HMO

## 2020-06-02 ENCOUNTER — Ambulatory Visit (HOSPITAL_COMMUNITY): Payer: Medicare HMO

## 2020-06-04 ENCOUNTER — Ambulatory Visit (HOSPITAL_COMMUNITY): Payer: Medicare HMO

## 2020-06-06 ENCOUNTER — Ambulatory Visit (HOSPITAL_COMMUNITY): Payer: Medicare HMO

## 2020-06-09 ENCOUNTER — Ambulatory Visit (HOSPITAL_COMMUNITY): Payer: Medicare HMO

## 2020-06-11 ENCOUNTER — Ambulatory Visit (HOSPITAL_COMMUNITY): Payer: Medicare HMO

## 2020-06-13 ENCOUNTER — Ambulatory Visit (HOSPITAL_COMMUNITY): Payer: Medicare HMO

## 2020-07-14 ENCOUNTER — Other Ambulatory Visit: Payer: Self-pay | Admitting: Family

## 2020-08-22 ENCOUNTER — Other Ambulatory Visit: Payer: Self-pay | Admitting: Cardiovascular Disease

## 2020-09-05 ENCOUNTER — Other Ambulatory Visit: Payer: Self-pay

## 2020-09-05 ENCOUNTER — Encounter: Payer: Self-pay | Admitting: Family

## 2020-09-05 ENCOUNTER — Ambulatory Visit (INDEPENDENT_AMBULATORY_CARE_PROVIDER_SITE_OTHER): Payer: Medicare HMO | Admitting: Family

## 2020-09-05 VITALS — BP 140/60 | HR 50 | Temp 98.4°F | Ht 67.5 in | Wt 141.0 lb

## 2020-09-05 DIAGNOSIS — L309 Dermatitis, unspecified: Secondary | ICD-10-CM

## 2020-09-05 MED ORDER — CETIRIZINE HCL 10 MG PO TABS: 10.0000 mg | ORAL_TABLET | Freq: Every day | ORAL | 2 refills | Status: DC

## 2020-09-05 MED ORDER — FAMOTIDINE 20 MG PO TABS: 20.0000 mg | ORAL_TABLET | Freq: Two times a day (BID) | ORAL | 0 refills | Status: DC

## 2020-09-05 NOTE — Progress Notes (Signed)
Troy Little is a 69 y.o. male with the following history as recorded in EpicCare:  Patient Active Problem List   Diagnosis Date Noted  . Neck swelling 04/25/2020  . Contact dermatitis 04/25/2020  . Angina pectoris ([place])   . Essential hypertension 01/22/2019  . Numbness 10/20/2018  . Hepatitis C antibody test positive 11/03/2015  . Diastolic dysfunction 59/56/3875  . Hyperlipidemia 11/01/2015  . Chronic neck pain 11/01/2015  . Leg weakness   . TIA (transient ischemic attack) 10/31/2015  . Type 2 diabetes mellitus with hyperglycemia, without long-term current use of insulin ([place]) 10/31/2015  . Anxiety and depression 10/31/2015  . Status post cholecystectomy 10/31/2015  . Tobacco use 10/31/2015  . Essential tremor 10/31/2015    Current Outpatient Medications  Medication Sig Dispense Refill  . aspirin EC 81 MG tablet Take 81 mg by mouth daily.    . clopidogrel (PLAVIX) 75 MG tablet Take 1 tablet by mouth once daily 90 tablet 2  . glucose blood (FREESTYLE LITE) test strip 1 each by Other route in the morning, at noon, and at bedtime. Use as instructed 100 each 12  . HYDROcodone-acetaminophen (NORCO/VICODIN) 5-325 MG tablet Take 1 tablet by mouth every 6 (six) hours as needed for moderate pain.    Marland Kitchen lisinopril (ZESTRIL) 40 MG tablet Take 1 tablet (40 mg total) by mouth daily. Overdue for Annual appt must see provider for future refills 30 tablet 0  . metFORMIN (GLUCOPHAGE) 500 MG tablet Take 1 tablet (500 mg total) by mouth daily with breakfast. Per patient, he takes 1/4 tablet per day "when he needs it"    . Omega-3 Fatty Acids (FISH OIL) 500 MG CAPS Take 500 mg by mouth 3 (three) times daily.     . primidone (MYSOLINE) 50 MG tablet Take 1 tablet (50 mg total) by mouth 2 (two) times daily. Per patient, takes  1 1/2 tablet in the am and 1 tablet in the afternoon    . propranolol (INDERAL) 10 MG tablet Patient takes 1 1/2 tablet in the am, 1 in the afternoon, 1 in the evening    .  simvastatin (ZOCOR) 20 MG tablet Take 20 mg by mouth at bedtime.     . triamcinolone cream (KENALOG) 0.1 % Apply 1 application topically 2 (two) times daily. 100 g 2  . cetirizine (ZYRTEC) 10 MG tablet Take 1 tablet (10 mg total) by mouth daily. 30 tablet 2  . famotidine (PEPCID) 20 MG tablet Take 1 tablet (20 mg total) by mouth 2 (two) times daily. 60 tablet 0   No current facility-administered medications for this visit.    Allergies: Prozac [fluoxetine hcl] and Dye fdc red [red dye]  Past Medical History:  Diagnosis Date  . Agoraphobia   . Anxiety   . Coronary artery disease   . Diabetes mellitus without complication ([place])   . Essential Tremor   . Hypercholesteremia   . MDD (major depressive disorder)   . Psychosis [facility])     Past Surgical History:  Procedure Laterality Date  . CARDIAC CATHETERIZATION    . CHOLECYSTECTOMY    . CORONARY STENT INTERVENTION N/A 09/24/2019   Procedure: CORONARY STENT INTERVENTION;  Surgeon: Jettie Booze, MD;  Location: [place] [facility];  Service: Cardiovascular;  Laterality: N/A;  . LEFT HEART CATH N/A 09/24/2019   Procedure: Left Heart Cath;  Surgeon: Jettie Booze, MD;  Location: [place] [facility];  Service: Cardiovascular;  Laterality: N/A;    Family History  Problem Relation Age of Onset  . Cancer Mother     Social History   Tobacco Use  . Smoking status: Current Every Day Smoker    Packs/day: 0.50    Years: 47.00    Pack years: 23.50    Types: Cigarettes  . Smokeless tobacco: Never Used  . Tobacco comment: Patient given 1-800-quit-now for smoking cessation  Substance Use Topics  . Alcohol use: Yes    Subjective:  Complaining of recurrent intermittent rash x 6 months; feels that "rash moves around." seen with similar concerns in June 2021- does not remember if he took prednisone that was prescribed; did not get the Atarax or Pepcid that were prescribed;   Objective:  Vitals:   09/05/20 1133  BP: 140/60  Pulse: (!)  50  Temp: 98.4 F (36.9 C)  TempSrc: Oral  SpO2: 96%  Weight: 141 lb (64 kg)  Height: 5' 7.5" (1.715 m)    General: Well developed, well nourished, in no acute distress  Skin : Warm and dry.  Head: Normocephalic and atraumatic  Lungs: Respirations unlabored;  Neurologic: Alert and oriented; speech intact; face symmetrical; moves all extremities well; CNII-XII intact without focal deficit   Assessment:  1. Dermatitis     Plan:  ? Hives; refer to dermatology; re-start Pepcid and add Zyrtec; may need to see allergist; follow up to be determined;  He continues to work with [facility] for management of his blood pressure and diabetes; he uses our office for acute care needs;  This visit occurred during the SARS-CoV-2 public health emergency.  Safety protocols were in place, including screening questions prior to the visit, additional usage of staff PPE, and extensive cleaning of exam room while observing appropriate contact time as indicated for disinfecting solutions.     No follow-ups on file.  Orders Placed This Encounter  Procedures  . Ambulatory referral to Dermatology    Referral Priority:   Routine    Referral Type:   Consultation    Referral Reason:   Specialty Services Required    Requested Specialty:   Dermatology    Number of Visits Requested:   1    Requested Prescriptions   Signed Prescriptions Disp Refills  . famotidine (PEPCID) 20 MG tablet 60 tablet 0    Sig: Take 1 tablet (20 mg total) by mouth 2 (two) times daily.  . cetirizine (ZYRTEC) 10 MG tablet 30 tablet 2    Sig: Take 1 tablet (10 mg total) by mouth daily.

## 2020-09-09 ENCOUNTER — Telehealth: Payer: Self-pay | Admitting: Family

## 2020-09-09 NOTE — Telephone Encounter (Signed)
glucose blood (FREESTYLE LITE) test strip Patient calling because [place] told him he needs prior auth for his test strips

## 2020-09-18 ENCOUNTER — Other Ambulatory Visit: Payer: Self-pay | Admitting: Internal Medicine

## 2020-09-18 DIAGNOSIS — E1165 Type 2 diabetes mellitus with hyperglycemia: Secondary | ICD-10-CM

## 2020-09-18 NOTE — Telephone Encounter (Signed)
Prior Auth has been submitted.

## 2020-09-18 NOTE — Telephone Encounter (Signed)
Patient is requesting an update on glucose blood (FREESTYLE LITE) test strip prior auth. Please call him at [phone].

## 2020-09-29 NOTE — Telephone Encounter (Signed)
Patient states [employer] told him they still need the diagnosis code for the test strips, he wants to talk to someone on why its not getting completed. [phone]

## 2020-09-30 NOTE — Telephone Encounter (Signed)
Patient calling back again and he is upset, he hasn't been able to check his blood sugar for a while. States someone in December told him that we gave Mcarthur Rossetti the diagnosis codes and when he talks to [place] they say they have never received it. Patient extremely upset and frustrated and just wants it to get completed.  [place] Patient- [phone]

## 2020-09-30 NOTE — Telephone Encounter (Signed)
Pt updated with [employer] call; that outcome could take up to 72hrs.  Pt appreciative & verb understanding. Ref #[id number] if needed.

## 2020-09-30 NOTE — Telephone Encounter (Signed)
Informed pt that I will call the number he provided for [facility] to verbally give them the information they need. Pt states [employer] said they received the PA, however, it was missing diagnosis codes. This RN will f/u.  Verified the [facility] number.

## 2020-09-30 NOTE — Telephone Encounter (Signed)
[employer] rep noted that PA was submitted however, all questions were not answered.  Completed PA via phone; outcome in approx 72hrs - will receive by fax.

## 2020-10-03 NOTE — Telephone Encounter (Signed)
PA for [employer] Strip has been approved from 09/20/20-09/19/21.  Pt notified.

## 2020-10-15 MED ORDER — FREESTYLE LITE TEST VI STRP: ORAL_STRIP | 12 refills | Status: DC

## 2020-10-15 NOTE — Telephone Encounter (Signed)
Follow up message  Patient calling to request order for test strips be faxed to [facility]. Please use fax # [phone]

## 2020-10-15 NOTE — Telephone Encounter (Signed)
Test strips refilled to [facility] as per pt request.  See phone encounter date 09/09/20

## 2020-10-15 NOTE — Telephone Encounter (Signed)
Test strips refilled to [facility] per pt request.  Pt updated & has no further ques/concerns at this time.

## 2020-10-15 NOTE — Addendum Note (Signed)
Addended by: Elza Rafter D on: 10/15/2020 03:25 PM   Modules accepted: Orders

## 2020-10-15 NOTE — Telephone Encounter (Signed)
Patient called and had questions about the PA for the [facility] Lite Test Strips. He can be reached at [phone].

## 2020-10-17 ENCOUNTER — Telehealth: Payer: Self-pay | Admitting: Family

## 2020-10-17 NOTE — Telephone Encounter (Signed)
famotidine (PEPCID) 20 MG tablet [place], [place]. Phone:  [phone]  Fax:  [phone]     Requesting a refill Last seen- 12.17.21 Next- n/a

## 2020-10-20 ENCOUNTER — Other Ambulatory Visit: Payer: Self-pay | Admitting: Family

## 2020-10-20 MED ORDER — FAMOTIDINE 20 MG PO TABS: 20.0000 mg | ORAL_TABLET | Freq: Two times a day (BID) | ORAL | 2 refills | Status: DC

## 2020-10-30 ENCOUNTER — Other Ambulatory Visit: Payer: Self-pay

## 2020-10-30 MED ORDER — BLOOD GLUCOSE METER KIT: PACK | 0 refills | Status: AC

## 2020-10-30 NOTE — Progress Notes (Signed)
Received faxed request from [facility] to use [employer].  Reordered.  Printed prescription to PCP sign.

## 2020-10-31 ENCOUNTER — Other Ambulatory Visit: Payer: Self-pay | Admitting: *Deleted

## 2020-11-04 ENCOUNTER — Telehealth: Payer: Self-pay

## 2020-11-04 ENCOUNTER — Other Ambulatory Visit: Payer: Self-pay

## 2020-11-04 NOTE — Telephone Encounter (Signed)
Message left for patient

## 2020-11-04 NOTE — Telephone Encounter (Signed)
He told me his [place] provider was handling this for him. We will not be doing refill. Please call him and let him know to reach out to his PCP at the [place].

## 2020-11-06 ENCOUNTER — Telehealth: Payer: Self-pay | Admitting: Family

## 2020-11-06 NOTE — Telephone Encounter (Signed)
famotidine (PEPCID) 20 MG tablet lisinopril (ZESTRIL) 40 MG tablet [employer] calling back, they also need these two refilled

## 2020-11-06 NOTE — Telephone Encounter (Signed)
[employer] calling, states they received the order for the test strips but did not get the order for the true [employer] meter and lancets.  [phone]

## 2020-11-07 MED ORDER — FAMOTIDINE 20 MG PO TABS: 20.0000 mg | ORAL_TABLET | Freq: Two times a day (BID) | ORAL | 0 refills | Status: DC

## 2020-11-07 NOTE — Telephone Encounter (Signed)
I do not manage his Lisinopril; it is managed by his [place] provider. I think Angela Nevin had already taken care of this earlier this week. We will not be filling this medication for him.  Can you please call [employer] and see what the issue is with the meter and lancets? This has been done multiple times. He is only instructed to test once per day.  I will send a short course of Pepcid.

## 2020-11-25 NOTE — Telephone Encounter (Signed)
Pt states that [employer] has canceled the Meter & lancets d/t no response from provider.  Will call [employer] to clarify prescription.

## 2020-11-25 NOTE — Telephone Encounter (Signed)
Verbal order given to pharmacist for pt's requested items: lancets, all prep pads, controls, strips.  Given to [place]; able to repeat back 90supply with 3 refills.

## 2021-01-14 ENCOUNTER — Telehealth: Payer: Self-pay | Admitting: Family

## 2021-01-14 NOTE — Telephone Encounter (Signed)
Okay with me if they want to see me.  Thanks.

## 2021-01-14 NOTE — Telephone Encounter (Signed)
Patients wife called and is requesting that the patient be a transfer of care. She said that [facility] is too far for them. Please advise

## 2021-02-12 ENCOUNTER — Encounter: Payer: Medicare HMO | Admitting: Emergency Medicine

## 2021-03-17 ENCOUNTER — Encounter: Payer: Self-pay | Admitting: Emergency Medicine

## 2021-03-17 ENCOUNTER — Ambulatory Visit (INDEPENDENT_AMBULATORY_CARE_PROVIDER_SITE_OTHER): Payer: Medicare HMO | Admitting: Emergency Medicine

## 2021-03-17 ENCOUNTER — Other Ambulatory Visit: Payer: Self-pay

## 2021-03-17 VITALS — BP 138/80 | HR 83 | Temp 98.5°F | Ht 67.5 in | Wt 149.0 lb

## 2021-03-17 DIAGNOSIS — I209 Angina pectoris, unspecified: Secondary | ICD-10-CM | POA: Diagnosis not present

## 2021-03-17 DIAGNOSIS — Z23 Encounter for immunization: Secondary | ICD-10-CM | POA: Diagnosis not present

## 2021-03-17 DIAGNOSIS — I5189 Other ill-defined heart diseases: Secondary | ICD-10-CM

## 2021-03-17 DIAGNOSIS — E785 Hyperlipidemia, unspecified: Secondary | ICD-10-CM

## 2021-03-17 DIAGNOSIS — M542 Cervicalgia: Secondary | ICD-10-CM | POA: Diagnosis not present

## 2021-03-17 DIAGNOSIS — Z720 Tobacco use: Secondary | ICD-10-CM

## 2021-03-17 DIAGNOSIS — I152 Hypertension secondary to endocrine disorders: Secondary | ICD-10-CM

## 2021-03-17 DIAGNOSIS — E1159 Type 2 diabetes mellitus with other circulatory complications: Secondary | ICD-10-CM

## 2021-03-17 DIAGNOSIS — F419 Anxiety disorder, unspecified: Secondary | ICD-10-CM | POA: Diagnosis not present

## 2021-03-17 DIAGNOSIS — E1165 Type 2 diabetes mellitus with hyperglycemia: Secondary | ICD-10-CM

## 2021-03-17 DIAGNOSIS — E1169 Type 2 diabetes mellitus with other specified complication: Secondary | ICD-10-CM | POA: Diagnosis not present

## 2021-03-17 DIAGNOSIS — F32A Depression, unspecified: Secondary | ICD-10-CM

## 2021-03-17 DIAGNOSIS — G250 Essential tremor: Secondary | ICD-10-CM | POA: Diagnosis not present

## 2021-03-17 DIAGNOSIS — Z7689 Persons encountering health services in other specified circumstances: Secondary | ICD-10-CM

## 2021-03-17 DIAGNOSIS — G8929 Other chronic pain: Secondary | ICD-10-CM

## 2021-03-17 LAB — POCT GLYCOSYLATED HEMOGLOBIN (HGB A1C): Hemoglobin A1C: 6.9 % — AB (ref 4.0–5.6)

## 2021-03-17 MED ORDER — CETIRIZINE HCL 10 MG PO TABS: 10.0000 mg | ORAL_TABLET | Freq: Every day | ORAL | 2 refills | Status: DC

## 2021-03-17 NOTE — Assessment & Plan Note (Signed)
Stable.  No recent anginal episodes. 

## 2021-03-17 NOTE — Assessment & Plan Note (Signed)
Cardiovascular and cancer risks associated with smoking discussed. °Smoking cessation advice given. °

## 2021-03-17 NOTE — Assessment & Plan Note (Signed)
Diet and nutrition discussed. Continue simvastatin 20 mg daily. 

## 2021-03-17 NOTE — Patient Instructions (Signed)
Health Maintenance, Male Adopting a healthy lifestyle and getting preventive care are important in promoting health and wellness. Ask your health care provider about: The right schedule for you to have regular tests and exams. Things you can do on your own to prevent diseases and keep yourself healthy. What should I know about diet, weight, and exercise? Eat a healthy diet  Eat a diet that includes plenty of vegetables, fruits, low-fat dairy products, and lean protein. Do not eat a lot of foods that are high in solid fats, added sugars, or sodium.  Maintain a healthy weight Body mass index (BMI) is a measurement that can be used to identify possible weight problems. It estimates body fat based on height and weight. Your health care provider can help determine your BMI and help you achieve or maintain ahealthy weight. Get regular exercise Get regular exercise. This is one of the most important things you can do for your health. Most adults should: Exercise for at least 150 minutes each week. The exercise should increase your heart rate and make you sweat (moderate-intensity exercise). Do strengthening exercises at least twice a week. This is in addition to the moderate-intensity exercise. Spend less time sitting. Even light physical activity can be beneficial. Watch cholesterol and blood lipids Have your blood tested for lipids and cholesterol at 68 years of age, then havethis test every 5 years. You may need to have your cholesterol levels checked more often if: Your lipid or cholesterol levels are high. You are older than 68 years of age. You are at high risk for heart disease. What should I know about cancer screening? Many types of cancers can be detected early and may often be prevented. Depending on your health history and family history, you may need to have cancer screening at various ages. This may include screening for: Colorectal cancer. Prostate cancer. Skin cancer. Lung  cancer. What should I know about heart disease, diabetes, and high blood pressure? Blood pressure and heart disease High blood pressure causes heart disease and increases the risk of stroke. This is more likely to develop in people who have high blood pressure readings, are of African descent, or are overweight. Talk with your health care provider about your target blood pressure readings. Have your blood pressure checked: Every 3-5 years if you are 18-39 years of age. Every year if you are 40 years old or older. If you are between the ages of 65 and 75 and are a current or former smoker, ask your health care provider if you should have a one-time screening for abdominal aortic aneurysm (AAA). Diabetes Have regular diabetes screenings. This checks your fasting blood sugar level. Have the screening done: Once every three years after age 45 if you are at a normal weight and have a low risk for diabetes. More often and at a younger age if you are overweight or have a high risk for diabetes. What should I know about preventing infection? Hepatitis B If you have a higher risk for hepatitis B, you should be screened for this virus. Talk with your health care provider to find out if you are at risk forhepatitis B infection. Hepatitis C Blood testing is recommended for: Everyone born from 1945 through 1965. Anyone with known risk factors for hepatitis C. Sexually transmitted infections (STIs) You should be screened each year for STIs, including gonorrhea and chlamydia, if: You are sexually active and are younger than 68 years of age. You are older than 68 years of age   and your health care provider tells you that you are at risk for this type of infection. Your sexual activity has changed since you were last screened, and you are at increased risk for chlamydia or gonorrhea. Ask your health care provider if you are at risk. Ask your health care provider about whether you are at high risk for HIV.  Your health care provider may recommend a prescription medicine to help prevent HIV infection. If you choose to take medicine to prevent HIV, you should first get tested for HIV. You should then be tested every 3 months for as long as you are taking the medicine. Follow these instructions at home: Lifestyle Do not use any products that contain nicotine or tobacco, such as cigarettes, e-cigarettes, and chewing tobacco. If you need help quitting, ask your health care provider. Do not use street drugs. Do not share needles. Ask your health care provider for help if you need support or information about quitting drugs. Alcohol use Do not drink alcohol if your health care provider tells you not to drink. If you drink alcohol: Limit how much you have to 0-2 drinks a day. Be aware of how much alcohol is in your drink. In the [place] one drink equals one 12 oz bottle of beer (355 mL), one 5 oz glass of wine (148 mL), or one 1 oz glass of hard liquor (44 mL). General instructions Schedule regular health, dental, and eye exams. Stay current with your vaccines. Tell your health care provider if: You often feel depressed. You have ever been abused or do not feel safe at home. Summary Adopting a healthy lifestyle and getting preventive care are important in promoting health and wellness. Follow your health care provider's instructions about healthy diet, exercising, and getting tested or screened for diseases. Follow your health care provider's instructions on monitoring your cholesterol and blood pressure. This information is not intended to replace advice given to you by your health care provider. Make sure you discuss any questions you have with your healthcare provider. Document Revised: 08/30/2018 Document Reviewed: 08/30/2018 Elsevier Patient Education  2022 [employer]  

## 2021-03-17 NOTE — Progress Notes (Signed)
Troy Little 70 y.o.   Chief Complaint  Patient presents with   Transitions Of Care   Medication Refill    Zyrtec    HISTORY OF PRESENT ILLNESS: This is a 70 y.o. male here to establish care with me.  Used to see Marvis Repress, NP. Smoker with a history of diabetes and hypertension and coronary artery disease. Sees cardiologist on a regular basis.  Presently on aspirin and Plavix. Takes lisinopril for blood pressure Propranolol for essential tremors Simvastatin for dyslipidemia History of chronic neck pain Has no complaints or medical concerns today  Medication Refill Pertinent negatives include no abdominal pain, chest pain, chills, congestion, coughing, fever, headaches, nausea, rash, sore throat or vomiting.    Prior to Admission medications   Medication Sig Start Date End Date Taking? Authorizing Provider  aspirin EC 81 MG tablet Take 81 mg by mouth daily.   Yes [provider]  blood glucose meter kit and supplies Dispense based on patient and insurance preference. Use to test in the morning, noon, and bedtime as directed. (FOR ICD-10 E10.9, E11.9). 10/30/20  Yes Marrian Salvage, FNP  clopidogrel (PLAVIX) 75 MG tablet Take 1 tablet by mouth once daily 08/22/20  Yes Sherren Mocha, MD  famotidine (PEPCID) 20 MG tablet Take 1 tablet (20 mg total) by mouth 2 (two) times daily. 11/07/20  Yes Marrian Salvage, FNP  lisinopril (ZESTRIL) 40 MG tablet Take 1 tablet (40 mg total) by mouth daily. Overdue for Annual appt must see provider for future refills 07/14/20  Yes Marrian Salvage, FNP  metFORMIN (GLUCOPHAGE) 500 MG tablet Take 1 tablet (500 mg total) by mouth daily with breakfast. Per patient, he takes 1/4 tablet per day "when he needs it" 11/10/18  Yes Valere Dross, Marvis Repress, FNP  Omega-3 Fatty Acids (FISH OIL) 500 MG CAPS Take 500 mg by mouth 3 (three) times daily.    Yes [provider]  primidone (MYSOLINE) 50 MG tablet Take 1 tablet (50  mg total) by mouth 2 (two) times daily. Per patient, takes  1 1/2 tablet in the am and 1 tablet in the afternoon 11/10/18  Yes Valere Dross, Marvis Repress, FNP  propranolol (INDERAL) 10 MG tablet Patient takes 1 1/2 tablet in the am, 1 in the afternoon, 1 in the evening 11/10/18  Yes Marrian Salvage, FNP  simvastatin (ZOCOR) 20 MG tablet Take 20 mg by mouth at bedtime.    Yes [provider]  triamcinolone cream (KENALOG) 0.1 % Apply 1 application topically 2 (two) times daily. 04/25/20  Yes Hoyt Koch, MD  cetirizine (ZYRTEC) 10 MG tablet Take 1 tablet (10 mg total) by mouth daily. 03/17/21   Horald Pollen, MD  HYDROcodone-acetaminophen (NORCO/VICODIN) 5-325 MG tablet Take 1 tablet by mouth every 6 (six) hours as needed for moderate pain. Patient not taking: Reported on 03/17/2021    [provider]    Allergies  Allergen Reactions   Prozac [Fluoxetine Hcl] Other (See Comments)    agitation   Dye Fdc Red [Red Dye] Rash    Patient Active Problem List   Diagnosis Date Noted   Neck swelling 04/25/2020   Contact dermatitis 04/25/2020   Angina pectoris ([place])    Essential hypertension 01/22/2019   Numbness 10/20/2018   Hepatitis C antibody test positive 59/27/6394   Diastolic dysfunction 32/00/3794   Hyperlipidemia 11/01/2015   Chronic neck pain 11/01/2015   Leg weakness    TIA (transient ischemic attack) 10/31/2015   Type 2  diabetes mellitus with hyperglycemia, without long-term current use of insulin ([place]) 10/31/2015   Anxiety and depression 10/31/2015   Status post cholecystectomy 10/31/2015   Tobacco use 10/31/2015   Essential tremor 10/31/2015    Past Medical History:  Diagnosis Date   Agoraphobia    Anxiety    Coronary artery disease    Diabetes mellitus without complication (HCC)    Essential Tremor    Hypercholesteremia    MDD (major depressive disorder)    Psychosis ([place])     Past Surgical History:  Procedure Laterality Date    CARDIAC CATHETERIZATION     CHOLECYSTECTOMY     CORONARY STENT INTERVENTION N/A 09/24/2019   Procedure: CORONARY STENT INTERVENTION;  Surgeon: Jettie Booze, MD;  Location: [facility];  Service: Cardiovascular;  Laterality: N/A;   LEFT HEART CATH N/A 09/24/2019   Procedure: Left Heart Cath;  Surgeon: Jettie Booze, MD;  Location: [facility];  Service: Cardiovascular;  Laterality: N/A;    Social History   Socioeconomic History   Marital status: Married    Spouse name: Not on file   Number of children: Not on file   Years of education: 12   Highest education level: Some college, no degree  Occupational History   Occupation: Disabled  Tobacco Use   Smoking status: Every Day    Packs/day: 0.50    Years: 47.00    Pack years: 23.50    Types: Cigarettes   Smokeless tobacco: Never   Tobacco comments:    Patient given 1-800-quit-now for smoking cessation  Substance and Sexual Activity   Alcohol use: Yes   Drug use: No    Types: Cocaine    Comment: denies   Sexual activity: Not on file  Other Topics Concern   Not on file  Social History Narrative   Not on file   Social Determinants of Health   Financial Resource Strain: Not on file  Food Insecurity: Not on file  Transportation Needs: Not on file  Physical Activity: Not on file  Stress: Not on file  Social Connections: Not on file  Intimate Partner Violence: Not on file    Family History  Problem Relation Age of Onset   Cancer Mother      Review of Systems  Constitutional: Negative.  Negative for chills and fever.  HENT: Negative.  Negative for congestion and sore throat.   Respiratory: Negative.  Negative for cough and shortness of breath.   Cardiovascular: Negative.  Negative for chest pain and palpitations.  Gastrointestinal:  Negative for abdominal pain, diarrhea, nausea and vomiting.  Genitourinary: Negative.  Negative for dysuria and hematuria.  Skin: Negative.  Negative for rash.   Neurological:  Negative for dizziness and headaches.  All other systems reviewed and are negative.  Vitals:   03/17/21 1106 03/17/21 1133  BP: (!) 146/78 138/80  Pulse: 83   Temp: 98.5 F (36.9 C)   SpO2: 96%      Physical Exam Vitals reviewed.  Constitutional:      Appearance: Normal appearance.  HENT:     Head: Normocephalic.  Eyes:     Extraocular Movements: Extraocular movements intact.     Conjunctiva/sclera: Conjunctivae normal.     Pupils: Pupils are equal, round, and reactive to light.  Cardiovascular:     Rate and Rhythm: Normal rate and regular rhythm.     Pulses: Normal pulses.     Heart sounds: Normal heart sounds.  Pulmonary:  Effort: Pulmonary effort is normal.     Breath sounds: Normal breath sounds.  Musculoskeletal:        General: Normal range of motion.     Cervical back: Normal range of motion and neck supple. No tenderness.     Right lower leg: No edema.     Left lower leg: No edema.  Lymphadenopathy:     Cervical: No cervical adenopathy.  Skin:    General: Skin is warm and dry.     Capillary Refill: Capillary refill takes less than 2 seconds.  Neurological:     General: No focal deficit present.     Mental Status: He is alert and oriented to person, place, and time.  Psychiatric:        Mood and Affect: Mood normal.        Behavior: Behavior normal.     ASSESSMENT & PLAN: A total of 30 minutes was spent with the patient and counseling/coordination of care regarding establishing care with me, review of multiple chronic medical problems and management, hypertension and diabetes and cardiovascular risks associated with these conditions, review of all medications, review of most recent blood work results including today's hemoglobin A1c, education on nutrition, cardiovascular and cancer risks associated with smoking, smoking cessation advised, review of most recent office visit notes, health maintenance items, prognosis, documentation and need  for follow-up.  Angina pectoris (HCC) Stable.  No recent anginal episodes.  Dyslipidemia associated with type 2 diabetes mellitus ([place]) Diet and nutrition discussed.  Continue simvastatin 20 mg daily.  Tobacco use Cardiovascular and cancer risks associated with smoking discussed. Smoking cessation advice given.  Hypertension associated with diabetes ([place]) Well-controlled hypertension.  Continue lisinopril 40 mg daily. Well-controlled diabetes with hemoglobin A1c of 6.9.  Continue metformin 500 mg twice a day Diet and nutrition discussed. Kirill was seen today for transitions of care and medication refill.  Diagnoses and all orders for this visit:  Hypertension associated with diabetes ([place])  Need for diphtheria-tetanus-pertussis (Tdap) vaccine -     Tdap vaccine greater than or equal to 7yo IM  Type 2 diabetes mellitus with hyperglycemia, without long-term current use of insulin (HCC) -     POCT glycosylated hemoglobin (Hb A1C)  Dyslipidemia associated with type 2 diabetes mellitus (HCC)  Angina pectoris (HCC)  Essential tremor  Diastolic dysfunction  Chronic neck pain  Anxiety and depression  Tobacco use  Encounter to establish care  Other orders -     cetirizine (ZYRTEC) 10 MG tablet; Take 1 tablet (10 mg total) by mouth daily.  Patient Instructions  Health Maintenance, Male Adopting a healthy lifestyle and getting preventive care are important in promoting health and wellness. Ask your health care provider about: The right schedule for you to have regular tests and exams. Things you can do on your own to prevent diseases and keep yourself healthy. What should I know about diet, weight, and exercise? Eat a healthy diet  Eat a diet that includes plenty of vegetables, fruits, low-fat dairy products, and lean protein. Do not eat a lot of foods that are high in solid fats, added sugars, or sodium.  Maintain a healthy weight Body mass index (BMI) is a  measurement that can be used to identify possible weight problems. It estimates body fat based on height and weight. Your health care provider can help determine your BMI and help you achieve or maintain ahealthy weight. Get regular exercise Get regular exercise. This is one of the most important things you  can do for your health. Most adults should: Exercise for at least 150 minutes each week. The exercise should increase your heart rate and make you sweat (moderate-intensity exercise). Do strengthening exercises at least twice a week. This is in addition to the moderate-intensity exercise. Spend less time sitting. Even light physical activity can be beneficial. Watch cholesterol and blood lipids Have your blood tested for lipids and cholesterol at 67 years of age, then havethis test every 5 years. You may need to have your cholesterol levels checked more often if: Your lipid or cholesterol levels are high. You are older than 68 years of age. You are at high risk for heart disease. What should I know about cancer screening? Many types of cancers can be detected early and may often be prevented. Depending on your health history and family history, you may need to have cancer screening at various ages. This may include screening for: Colorectal cancer. Prostate cancer. Skin cancer. Lung cancer. What should I know about heart disease, diabetes, and high blood pressure? Blood pressure and heart disease High blood pressure causes heart disease and increases the risk of stroke. This is more likely to develop in people who have high blood pressure readings, are of African descent, or are overweight. Talk with your health care provider about your target blood pressure readings. Have your blood pressure checked: Every 3-5 years if you are 28-37 years of age. Every year if you are 31 years old or older. If you are between the ages of 48 and 84 and are a current or former smoker, ask your health care  provider if you should have a one-time screening for abdominal aortic aneurysm (AAA). Diabetes Have regular diabetes screenings. This checks your fasting blood sugar level. Have the screening done: Once every three years after age 39 if you are at a normal weight and have a low risk for diabetes. More often and at a younger age if you are overweight or have a high risk for diabetes. What should I know about preventing infection? Hepatitis B If you have a higher risk for hepatitis B, you should be screened for this virus. Talk with your health care provider to find out if you are at risk forhepatitis B infection. Hepatitis C Blood testing is recommended for: Everyone born from [birth year] through 1963. Anyone with known risk factors for hepatitis C. Sexually transmitted infections (STIs) You should be screened each year for STIs, including gonorrhea and chlamydia, if: You are sexually active and are younger than 68 years of age. You are older than 68 years of age and your health care provider tells you that you are at risk for this type of infection. Your sexual activity has changed since you were last screened, and you are at increased risk for chlamydia or gonorrhea. Ask your health care provider if you are at risk. Ask your health care provider about whether you are at high risk for HIV. Your health care provider may recommend a prescription medicine to help prevent HIV infection. If you choose to take medicine to prevent HIV, you should first get tested for HIV. You should then be tested every 3 months for as long as you are taking the medicine. Follow these instructions at home: Lifestyle Do not use any products that contain nicotine or tobacco, such as cigarettes, e-cigarettes, and chewing tobacco. If you need help quitting, ask your health care provider. Do not use street drugs. Do not share needles. Ask your health care provider for help  if you need support or information about quitting  drugs. Alcohol use Do not drink alcohol if your health care provider tells you not to drink. If you drink alcohol: Limit how much you have to 0-2 drinks a day. Be aware of how much alcohol is in your drink. In the [place] one drink equals one 12 oz bottle of beer (355 mL), one 5 oz glass of wine (148 mL), or one 1 oz glass of hard liquor (44 mL). General instructions Schedule regular health, dental, and eye exams. Stay current with your vaccines. Tell your health care provider if: You often feel depressed. You have ever been abused or do not feel safe at home. Summary Adopting a healthy lifestyle and getting preventive care are important in promoting health and wellness. Follow your health care provider's instructions about healthy diet, exercising, and getting tested or screened for diseases. Follow your health care provider's instructions on monitoring your cholesterol and blood pressure. This information is not intended to replace advice given to you by your health care provider. Make sure you discuss any questions you have with your healthcare provider. Document Revised: 08/30/2018 Document Reviewed: 08/30/2018 Elsevier Patient Education  2022 [place], MD [facility]

## 2021-03-17 NOTE — Assessment & Plan Note (Signed)
Well-controlled hypertension.  Continue lisinopril 40 mg daily. Well-controlled diabetes with hemoglobin A1c of 6.9.  Continue metformin 500 mg twice a day Diet and nutrition discussed.

## 2021-04-30 ENCOUNTER — Telehealth: Payer: Self-pay | Admitting: Emergency Medicine

## 2021-04-30 NOTE — Telephone Encounter (Signed)
Left message for patient to call me back at [phone] to schedule Medicare Annual Wellness Visit   No hx of AWV eligible as of 09/20/09  Please schedule at anytime with [facility] Advisor if patient calls the office back.    76 Minutes appointment   Any questions, please call me at [phone]

## 2021-06-17 ENCOUNTER — Ambulatory Visit: Payer: Medicare HMO | Admitting: Emergency Medicine

## 2021-06-17 ENCOUNTER — Ambulatory Visit: Payer: Medicare HMO

## 2021-06-23 ENCOUNTER — Ambulatory Visit: Payer: Medicare HMO | Admitting: Emergency Medicine

## 2021-06-23 ENCOUNTER — Ambulatory Visit: Payer: Medicare HMO

## 2021-07-01 ENCOUNTER — Encounter: Payer: Self-pay | Admitting: Internal Medicine

## 2021-07-01 ENCOUNTER — Telehealth (INDEPENDENT_AMBULATORY_CARE_PROVIDER_SITE_OTHER): Payer: Medicare HMO | Admitting: Internal Medicine

## 2021-07-01 DIAGNOSIS — R062 Wheezing: Secondary | ICD-10-CM | POA: Diagnosis not present

## 2021-07-01 DIAGNOSIS — R051 Acute cough: Secondary | ICD-10-CM

## 2021-07-01 DIAGNOSIS — Z720 Tobacco use: Secondary | ICD-10-CM

## 2021-07-01 MED ORDER — PREDNISONE 10 MG PO TABS: ORAL_TABLET | ORAL | 0 refills | Status: DC

## 2021-07-01 MED ORDER — HYDROCODONE BIT-HOMATROP MBR 5-1.5 MG/5ML PO SOLN: 5.0000 mL | Freq: Four times a day (QID) | ORAL | 0 refills | Status: AC | PRN

## 2021-07-01 MED ORDER — AZITHROMYCIN 250 MG PO TABS: ORAL_TABLET | ORAL | 0 refills | Status: AC

## 2021-07-01 NOTE — Progress Notes (Signed)
Patient ID: Troy Little, male   DOB: [birth year], 70 y.o.   MRN: [id number]  Virtual Visit via Video Note  I connected with Shyrl Numbers on 07/01/21 at  2:20 PM EDT by a video enabled telemedicine application and verified that I am speaking with the correct person using two identifiers.  Location of all participants today Patient: at home Provider: at office   I discussed the limitations of evaluation and management by telemedicine and the availability of in person appointments. The patient expressed understanding and agreed to proceed.  History of Present Illness: Here with acute onset mild to mod 2-3 days ST, HA, general weakness and malaise, with prod cough greenish sputum, but Pt denies chest pain, increased sob or doe, wheezing, orthopnea, PND, increased LE swelling, palpitations, dizziness or syncope, except for onset mild wheezing in last 2 days   Pt denies wt loss, night sweats, loss of appetite, or other constitutional symptoms  Still smoking, urged to quit, pt not ready   Past Medical History:  Diagnosis Date   Agoraphobia    Anxiety    Coronary artery disease    Diabetes mellitus without complication ([place])    Essential Tremor    Hypercholesteremia    MDD (major depressive disorder)    Psychosis ([place])    Past Surgical History:  Procedure Laterality Date   CARDIAC CATHETERIZATION     CHOLECYSTECTOMY     CORONARY STENT INTERVENTION N/A 09/24/2019   Procedure: CORONARY STENT INTERVENTION;  Surgeon: Jettie Booze, MD;  Location: [facility] [facility];  Service: Cardiovascular;  Laterality: N/A;   LEFT HEART CATH N/A 09/24/2019   Procedure: Left Heart Cath;  Surgeon: Jettie Booze, MD;  Location: [place] [facility];  Service: Cardiovascular;  Laterality: N/A;    reports that he has been smoking cigarettes. He has a 23.50 pack-year smoking history. He has never used smokeless tobacco. He reports current alcohol use. He reports that he does not use drugs. family  history includes Cancer in his mother. Allergies  Allergen Reactions   Prozac [Fluoxetine Hcl] Other (See Comments)    agitation   Dye Fdc Red [Red Dye] Rash   Current Outpatient Medications on File Prior to Visit  Medication Sig Dispense Refill   aspirin EC 81 MG tablet Take 81 mg by mouth daily.     blood glucose meter kit and supplies Dispense based on patient and insurance preference. Use to test in the morning, noon, and bedtime as directed. (FOR ICD-10 E10.9, E11.9). 1 each 0   cetirizine (ZYRTEC) 10 MG tablet Take 1 tablet (10 mg total) by mouth daily. 30 tablet 2   clopidogrel (PLAVIX) 75 MG tablet Take 1 tablet by mouth once daily 90 tablet 2   famotidine (PEPCID) 20 MG tablet Take 1 tablet (20 mg total) by mouth 2 (two) times daily. 180 tablet 0   lisinopril (ZESTRIL) 40 MG tablet Take 1 tablet (40 mg total) by mouth daily. Overdue for Annual appt must see provider for future refills 30 tablet 0   metFORMIN (GLUCOPHAGE) 500 MG tablet Take 1 tablet (500 mg total) by mouth daily with breakfast. Per patient, he takes 1/4 tablet per day "when he needs it"     Omega-3 Fatty Acids (FISH OIL) 500 MG CAPS Take 500 mg by mouth 3 (three) times daily.      primidone (MYSOLINE) 50 MG tablet Take 1 tablet (50 mg total) by mouth 2 (two) times daily. Per patient, takes  1  1/2 tablet in the am and 1 tablet in the afternoon     propranolol (INDERAL) 10 MG tablet Patient takes 1 1/2 tablet in the am, 1 in the afternoon, 1 in the evening     simvastatin (ZOCOR) 20 MG tablet Take 20 mg by mouth at bedtime.      triamcinolone cream (KENALOG) 0.1 % Apply 1 application topically 2 (two) times daily. 100 g 2   No current facility-administered medications on file prior to visit.   Observations/Objective: Alert, NAD, appropriate mood and affect, resps normal, cn 2-12 intact, moves all 4s, no visible rash or swelling Lab Results  Component Value Date   WBC 5.0 09/24/2019   HGB 14.6 09/24/2019   HCT 44.8  09/24/2019   PLT 185 09/24/2019   GLUCOSE 144 (H) 09/24/2019   CHOL 129 05/08/2019   TRIG 167.0 (H) 05/08/2019   HDL 48.40 05/08/2019   LDLCALC 47 05/08/2019   ALT 23 05/08/2019   AST 20 05/08/2019   NA 137 09/24/2019   K 4.1 09/24/2019   CL 101 09/24/2019   CREATININE 0.94 09/24/2019   BUN 15 09/24/2019   CO2 28 09/24/2019   TSH 1.00 05/08/2019   PSA 0.43 10/03/2018   INR 1.04 10/31/2015   HGBA1C 6.9 (A) 03/17/2021   MICROALBUR <0.7 10/03/2018   Assessment and Plan: See notes  Follow Up Instructions: See notes   I discussed the assessment and treatment plan with the patient. The patient was provided an opportunity to ask questions and all were answered. The patient agreed with the plan and demonstrated an understanding of the instructions.   The patient was advised to call back or seek an in-person evaluation if the symptoms worsen or if the condition fails to improve as anticipated.  Cathlean Cower, MD '

## 2021-07-02 ENCOUNTER — Telehealth: Payer: Self-pay | Admitting: Emergency Medicine

## 2021-07-02 MED ORDER — DEXTROMETHORPHAN-GUAIFENESIN 10-100 MG/5ML PO LIQD: 5.0000 mL | Freq: Four times a day (QID) | ORAL | 0 refills | Status: DC | PRN

## 2021-07-02 NOTE — Telephone Encounter (Signed)
Ok I sent different cough medicine

## 2021-07-02 NOTE — Telephone Encounter (Signed)
Patient called regarding prescriptions sent in yesterday, pharmacy has a question about   HYDROcodone bit-homatropine (HYCODAN) 5-1.5 MG/5ML syrup  Patient states he doesn't want to make two trips to the pharmacy  Please call him when pharmacy question has been resolved

## 2021-07-02 NOTE — Telephone Encounter (Signed)
Patient notified

## 2021-07-02 NOTE — Telephone Encounter (Signed)
Spoke with pharmacy and they stated that insurance doesn't cover medication. Total cost is $34.77 and patient states that he does not wish to pay this. Is there anything else that can be prescribed? Please advise

## 2021-07-04 ENCOUNTER — Encounter: Payer: Self-pay | Admitting: Internal Medicine

## 2021-07-04 DIAGNOSIS — R059 Cough, unspecified: Secondary | ICD-10-CM | POA: Insufficient documentation

## 2021-07-04 DIAGNOSIS — R062 Wheezing: Secondary | ICD-10-CM | POA: Insufficient documentation

## 2021-07-04 NOTE — Assessment & Plan Note (Signed)
Pt counseled to quit, pt not ready 

## 2021-07-04 NOTE — Assessment & Plan Note (Signed)
/  Mild to mod, for predpac asd,  to f/u any worsening symptoms or concerns 

## 2021-07-04 NOTE — Assessment & Plan Note (Signed)
Mild to mod, c/w bronchitis vs npna, to check home covid,  for antibx course, cough med prn,  to f/u any worsening symptoms or concerns

## 2021-07-04 NOTE — Patient Instructions (Signed)
Please take all new medication as prescribed 

## 2021-07-22 ENCOUNTER — Other Ambulatory Visit: Payer: Self-pay | Admitting: Family

## 2021-07-29 ENCOUNTER — Telehealth: Payer: Self-pay | Admitting: Emergency Medicine

## 2021-07-29 MED ORDER — CETIRIZINE HCL 10 MG PO TABS: 10.0000 mg | ORAL_TABLET | Freq: Every day | ORAL | 2 refills | Status: DC

## 2021-07-29 NOTE — Telephone Encounter (Signed)
1.Medication Requested: cetirizine (ZYRTEC) 10 MG tablet 2. Pharmacy (Name, Street, [place]): [facility], [place]. Phone:  [phone]  Fax:  [phone]     3. On Med List: y  77. Last Visit with PCP: 6.28.2022  5. Next visit date with PCP:   Agent: Please be advised that RX refills may take up to 3 business days. We ask that you follow-up with your pharmacy.

## 2021-07-29 NOTE — Telephone Encounter (Signed)
Refilled cetirizine to [facility].

## 2021-09-17 ENCOUNTER — Ambulatory Visit: Payer: Medicare HMO | Admitting: Emergency Medicine

## 2021-10-22 ENCOUNTER — Other Ambulatory Visit: Payer: Self-pay | Admitting: Family

## 2021-10-30 ENCOUNTER — Other Ambulatory Visit: Payer: Self-pay

## 2021-10-30 MED ORDER — CETIRIZINE HCL 10 MG PO TABS: 10.0000 mg | ORAL_TABLET | Freq: Every day | ORAL | 2 refills | Status: DC

## 2021-10-30 MED ORDER — TRUE METRIX BLOOD GLUCOSE TEST VI STRP: ORAL_STRIP | 1 refills | Status: DC

## 2021-10-30 MED ORDER — TRUEPLUS LANCETS 33G MISC: 1 refills | Status: DC

## 2021-10-30 NOTE — Progress Notes (Signed)
Received fax medication request from [facility] for True Metrix test strips and lancets. Also refilled Zyrtec.

## 2021-11-03 ENCOUNTER — Other Ambulatory Visit: Payer: Self-pay

## 2021-11-03 DIAGNOSIS — E1165 Type 2 diabetes mellitus with hyperglycemia: Secondary | ICD-10-CM

## 2021-11-03 DIAGNOSIS — E1159 Type 2 diabetes mellitus with other circulatory complications: Secondary | ICD-10-CM

## 2021-11-03 MED ORDER — ALCOHOL WIPES 70 % PADS: MEDICATED_PAD | 1 refills | Status: DC

## 2021-12-02 ENCOUNTER — Encounter: Payer: Self-pay | Admitting: Emergency Medicine

## 2021-12-02 ENCOUNTER — Ambulatory Visit (INDEPENDENT_AMBULATORY_CARE_PROVIDER_SITE_OTHER): Payer: Medicare HMO | Admitting: Emergency Medicine

## 2021-12-02 ENCOUNTER — Other Ambulatory Visit: Payer: Self-pay

## 2021-12-02 VITALS — BP 142/78 | HR 57 | Temp 98.2°F | Ht 67.5 in | Wt 147.1 lb

## 2021-12-02 DIAGNOSIS — H1011 Acute atopic conjunctivitis, right eye: Secondary | ICD-10-CM

## 2021-12-02 MED ORDER — AZELASTINE HCL 0.05 % OP SOLN: 2.0000 [drp] | Freq: Two times a day (BID) | OPHTHALMIC | 3 refills | Status: AC

## 2021-12-02 NOTE — Patient Instructions (Signed)
Allergic Conjunctivitis, Adult ?Allergic conjunctivitis is inflammation of the clear membrane (conjunctiva) that covers the white part of your eye and the inner surface of your eyelid. ?This condition can make your eye red or pink. It can also make your eye feel itchy. This condition cannot be spread from one person to another person (is not contagious). ?What are the causes? ?This condition is caused by allergens. These are things that can cause an allergic reaction in some people but not in others. Common allergens include: ?Outdoor allergens, such as: ?Pollen, including pollen from grass and weeds. ?Mold. ?Car fumes. ?Indoor allergens, such as: ?Dust. ?Smoke. ?Mold. ?Proteins in a pet's pee (urine), saliva, or dander. ?What increases the risk? ?You are more likely to develop this condition if you have a family history of these things: ?Allergies. ?Conditions that you get because of allergens, such as asthma or inflammation of the skin (eczema). ?What are the signs or symptoms? ?Symptoms of this condition include eyes that are: ?Itchy. ?Red. ?Watery. ?Puffy. ?Your eyes may also: ?Sting or burn. ?Have clear fluid draining from them. ?Have thick mucus coming from them. ?How is this treated? ?This condition may be treated with: ?Cold, wet cloths (cold compresses) to soothe itching and swelling. ?Washing the face to remove allergens. ?Eye drops. These may include: ?Eye drops that block allergies. ?Eye drops that reduce swelling and irritation. ?Steroid eye drops if other treatments have not worked. ?Oral antihistamine medicines. These medicines lessen your allergies. You may need these if eye drops do not help or are difficult to use. ?Follow these instructions at home: ?Eye care ?Place a cool, clean washcloth on your eye for 10-20 minutes. Do this 3-4 times a day. ?Do not touch or rub your eyes. ?Do not wear contact lenses until the inflammation is gone. Wear glasses instead. ?Do not wear eye makeup until the  inflammation is gone. ?General instructions ?Try not to be around things that you are allergic to. ?Take or apply over-the-counter and prescription medicines only as told by your doctor. These include any eye drops. ?Drink enough fluid to keep your pee pale yellow. ?Keep all follow-up visits as told by your doctor. This is important. ?Contact a doctor if: ?Your symptoms get worse. ?Your symptoms do not get better with treatment. ?You have mild eye pain. ?You are sensitive to light. ?You have spots or blisters on your eyes. ?You have pus coming from your eye. ?You have a fever. ?Get help right away if: ?You have redness, swelling, or other symptoms in only one eye. ?You cannot see well. ?You have other vision changes. ?You have very bad eye pain. ?Summary ?Allergic conjunctivitis is caused by allergens. It can make your eye red or pink, and it can make your eye feel itchy. ?This condition cannot be spread from one person to another person (is not contagious). ?Try not to be around things that you are allergic to. ?Take or apply over-the-counter and prescription medicines only as told by your doctor. These include any eye drops. ?Contact your doctor if your symptoms get worse or they do not get better with treatment. ?This information is not intended to replace advice given to you by your health care provider. Make sure you discuss any questions you have with your health care provider. ?Document Revised: 07/30/2019 Document Reviewed: 07/30/2019 ?Elsevier Patient Education ? [place]. ? ?

## 2021-12-02 NOTE — Progress Notes (Signed)
Troy Little ?71 y.o. ? ? ?Chief Complaint  ?Patient presents with  ? right eye itching  ?  X 3 weeks, no drainage, no pain   ? ? ?HISTORY OF PRESENT ILLNESS: ?This is a 71 y.o. male complaining of redness and itching of right eye that started 3 weeks ago. ?No discharge or crusting in the morning.  No visual disturbances. ?No other complaints or medical concerns today. ? ?HPI ? ? ?Prior to Admission medications   ?Medication Sig Start Date End Date Taking? Authorizing Provider  ?Alcohol Swabs (ALCOHOL WIPES) 70 % PADS USE PAD TO AFFECTED AREA AS DIRECTED 11/03/21  Yes Bashir Marchetti, Ines Bloomer, MD  ?aspirin EC 81 MG tablet Take 81 mg by mouth daily.   Yes [provider]  ?blood glucose meter kit and supplies Dispense based on patient and insurance preference. Use to test in the morning, noon, and bedtime as directed. (FOR ICD-10 E10.9, E11.9). 10/30/20  Yes Marrian Salvage, [place]  ?cetirizine (ZYRTEC) 10 MG tablet Take 1 tablet (10 mg total) by mouth daily. 10/30/21  Yes Horald Pollen, MD  ?clopidogrel (PLAVIX) 75 MG tablet Take 1 tablet by mouth once daily 08/22/20  Yes Sherren Mocha, MD  ?dextromethorphan-guaiFENesin (ROBITUSSIN-DM) 10-100 MG/5ML liquid Take 5 mLs by mouth every 6 (six) hours as needed for cough. 07/02/21  Yes Biagio Borg, MD  ?famotidine (PEPCID) 20 MG tablet TAKE 1 TABLET TWICE DAILY 07/22/21  Yes Marrian Salvage, FNP  ?lisinopril (ZESTRIL) 40 MG tablet Take 1 tablet (40 mg total) by mouth daily. Overdue for Annual appt must see provider for future refills 07/14/20  Yes Marrian Salvage, [place]  ?metFORMIN (GLUCOPHAGE) 500 MG tablet Take 1 tablet (500 mg total) by mouth daily with breakfast. Per patient, he takes 1/4 tablet per day "when he needs it" 11/10/18  Yes Valere Dross Marvis Repress, FNP  ?Omega-3 Fatty Acids (FISH OIL) 500 MG CAPS Take 500 mg by mouth 3 (three) times daily.    Yes [provider]  ?predniSONE (DELTASONE) 10 MG tablet 3 tabs by mouth  per day for 3 days,2tabs per day for 3 days,1tab per day for 3 days 07/01/21  Yes Biagio Borg, MD  ?primidone (MYSOLINE) 50 MG tablet Take 1 tablet (50 mg total) by mouth 2 (two) times daily. Per patient, takes  1 1/2 tablet in the am and 1 tablet in the afternoon 11/10/18  Yes Valere Dross Marvis Repress, FNP  ?propranolol (INDERAL) 10 MG tablet Patient takes 1 1/2 tablet in the am, 1 in the afternoon, 1 in the evening 11/10/18  Yes Valere Dross Marvis Repress, [place]  ?simvastatin (ZOCOR) 20 MG tablet Take 20 mg by mouth at bedtime.    Yes [provider]  ?triamcinolone cream (KENALOG) 0.1 % Apply 1 application topically 2 (two) times daily. 04/25/20  Yes Hoyt Koch, MD  ?TRUE METRIX BLOOD GLUCOSE TEST test strip SMARTSIG:Via Meter 10/30/21  Yes Ona Roehrs, Ines Bloomer, MD  ?TRUEplus Lancets 33G MISC Use per instructions. 10/30/21  Yes Horald Pollen, MD  ? ? ?Allergies  ?Allergen Reactions  ? Prozac [Fluoxetine Hcl] Other (See Comments)  ?  agitation  ? Dye Fdc Red [Red Dye] Rash  ? ? ?Patient Active Problem List  ? Diagnosis Date Noted  ? Cough 07/04/2021  ? Wheezing 07/04/2021  ? Angina pectoris ([place])   ? Hypertension associated with diabetes ([place]) 01/22/2019  ? Diastolic dysfunction 09/47/0962  ? Hyperlipidemia 11/01/2015  ? Chronic neck pain 11/01/2015  ?  TIA (transient ischemic attack) 10/31/2015  ? Dyslipidemia associated with type 2 diabetes mellitus ([place]) 10/31/2015  ? Anxiety and depression 10/31/2015  ? Tobacco use 10/31/2015  ? Essential tremor 10/31/2015  ? ? ?Past Medical History:  ?Diagnosis Date  ? Agoraphobia   ? Anxiety   ? Coronary artery disease   ? Diabetes mellitus without complication ([place])   ? Essential Tremor   ? Hypercholesteremia   ? MDD (major depressive disorder)   ? Psychosis ([place])   ? ? ?Past Surgical History:  ?Procedure Laterality Date  ? CARDIAC CATHETERIZATION    ? CHOLECYSTECTOMY    ? CORONARY STENT INTERVENTION N/A 09/24/2019  ? Procedure: CORONARY STENT INTERVENTION;   Surgeon: Jettie Booze, MD;  Location: [facility];  Service: Cardiovascular;  Laterality: N/A;  ? LEFT HEART CATH N/A 09/24/2019  ? Procedure: Left Heart Cath;  Surgeon: Jettie Booze, MD;  Location: [facility];  Service: Cardiovascular;  Laterality: N/A;  ? ? ?Social History  ? ?Socioeconomic History  ? Marital status: Married  ?  Spouse name: Not on file  ? Number of children: Not on file  ? Years of education: 11  ? Highest education level: Some college, no degree  ?Occupational History  ? Occupation: Disabled  ?Tobacco Use  ? Smoking status: Every Day  ?  Packs/day: 0.50  ?  Years: 47.00  ?  Pack years: 23.50  ?  Types: Cigarettes  ? Smokeless tobacco: Never  ? Tobacco comments:  ?  Patient given 1-800-quit-now for smoking cessation  ?Substance and Sexual Activity  ? Alcohol use: Yes  ? Drug use: No  ?  Types: Cocaine  ?  Comment: denies  ? Sexual activity: Not on file  ?Other Topics Concern  ? Not on file  ?Social History Narrative  ? Not on file  ? ?Social Determinants of Health  ? ?Financial Resource Strain: Not on file  ?Food Insecurity: Not on file  ?Transportation Needs: Not on file  ?Physical Activity: Not on file  ?Stress: Not on file  ?Social Connections: Not on file  ?Intimate Partner Violence: Not on file  ? ? ?Family History  ?Problem Relation Age of Onset  ? Cancer Mother   ? ? ? ?Review of Systems  ?Constitutional: Negative.  Negative for chills and fever.  ?HENT:  Negative for congestion and sore throat.   ?Eyes:  Positive for redness. Negative for blurred vision, double vision, pain and discharge.  ?Respiratory: Negative.  Negative for cough and shortness of breath.   ?Cardiovascular: Negative.  Negative for chest pain and palpitations.  ?Gastrointestinal:  Negative for abdominal pain, diarrhea, nausea and vomiting.  ?Musculoskeletal:   ?     Right index finger pain  ?Skin: Negative.  Negative for rash.  ?Neurological: Negative.  Negative for dizziness and headaches.   ?All other systems reviewed and are negative. ? ?Today's Vitals  ? 12/02/21 1348  ?BP: (!) 142/78  ?Pulse: (!) 57  ?Temp: 98.2 ?F (36.8 ?C)  ?SpO2: 98%  ?Weight: 147 lb 2 oz (66.7 kg)  ?Height: 5' 7.5" (1.715 m)  ? ?Body mass index is 22.7 kg/m?. ? ?Physical Exam ?Vitals reviewed.  ?Constitutional:   ?   Appearance: Normal appearance.  ?HENT:  ?   Head: Normocephalic.  ?Eyes:  ?   General: Lids are normal.  ?   Extraocular Movements: Extraocular movements intact.  ?   Conjunctiva/sclera:  ?   Right eye: Right conjunctiva is injected. No  exudate or hemorrhage. ?Cardiovascular:  ?   Rate and Rhythm: Normal rate.  ?Pulmonary:  ?   Effort: Pulmonary effort is normal.  ?Musculoskeletal:  ?   Comments: Right hand: Mild tenderness to lateral aspect of second MCP joint  ?Skin: ?   General: Skin is warm and dry.  ?Neurological:  ?   General: No focal deficit present.  ?   Mental Status: He is alert and oriented to person, place, and time.  ?Psychiatric:     ?   Mood and Affect: Mood normal.     ?   Behavior: Behavior normal.  ? ? ?ASSESSMENT & PLAN: ?Problem List Items Addressed This Visit   ?None ?Visit Diagnoses   ? ? Allergic conjunctivitis of right eye    -  Primary  ? Relevant Medications  ? azelastine (OPTIVAR) 0.05 % ophthalmic solution  ? ?  ? ?Patient Instructions  ?Allergic Conjunctivitis, Adult ?Allergic conjunctivitis is inflammation of the clear membrane (conjunctiva) that covers the white part of your eye and the inner surface of your eyelid. ?This condition can make your eye red or pink. It can also make your eye feel itchy. This condition cannot be spread from one person to another person (is not contagious). ?What are the causes? ?This condition is caused by allergens. These are things that can cause an allergic reaction in some people but not in others. Common allergens include: ?Outdoor allergens, such as: ?Pollen, including pollen from grass and weeds. ?Mold. ?Car fumes. ?Indoor allergens, such  as: ?Dust. ?Smoke. ?Mold. ?Proteins in a pet's pee (urine), saliva, or dander. ?What increases the risk? ?You are more likely to develop this condition if you have a family history of these things: ?Allergies. ?C

## 2021-12-17 ENCOUNTER — Other Ambulatory Visit: Payer: Self-pay | Admitting: *Deleted

## 2021-12-17 MED ORDER — TRUE METRIX BLOOD GLUCOSE TEST VI STRP: ORAL_STRIP | 1 refills | Status: DC

## 2022-02-23 ENCOUNTER — Other Ambulatory Visit: Payer: Self-pay | Admitting: Emergency Medicine

## 2022-02-23 ENCOUNTER — Other Ambulatory Visit: Payer: Self-pay | Admitting: Family

## 2022-02-23 DIAGNOSIS — E1165 Type 2 diabetes mellitus with hyperglycemia: Secondary | ICD-10-CM

## 2022-02-23 DIAGNOSIS — E1159 Type 2 diabetes mellitus with other circulatory complications: Secondary | ICD-10-CM

## 2022-03-15 ENCOUNTER — Ambulatory Visit (INDEPENDENT_AMBULATORY_CARE_PROVIDER_SITE_OTHER): Payer: Medicare HMO | Admitting: Emergency Medicine

## 2022-03-15 ENCOUNTER — Encounter: Payer: Self-pay | Admitting: Emergency Medicine

## 2022-03-15 VITALS — BP 130/70 | HR 50 | Temp 98.5°F | Wt 149.1 lb

## 2022-03-15 DIAGNOSIS — F17200 Nicotine dependence, unspecified, uncomplicated: Secondary | ICD-10-CM | POA: Diagnosis not present

## 2022-03-15 DIAGNOSIS — Z720 Tobacco use: Secondary | ICD-10-CM | POA: Diagnosis not present

## 2022-03-15 DIAGNOSIS — E785 Hyperlipidemia, unspecified: Secondary | ICD-10-CM | POA: Diagnosis not present

## 2022-03-15 DIAGNOSIS — I152 Hypertension secondary to endocrine disorders: Secondary | ICD-10-CM | POA: Diagnosis not present

## 2022-03-15 DIAGNOSIS — E1169 Type 2 diabetes mellitus with other specified complication: Secondary | ICD-10-CM

## 2022-03-15 DIAGNOSIS — E1165 Type 2 diabetes mellitus with hyperglycemia: Secondary | ICD-10-CM | POA: Diagnosis not present

## 2022-03-15 DIAGNOSIS — M5432 Sciatica, left side: Secondary | ICD-10-CM | POA: Insufficient documentation

## 2022-03-15 DIAGNOSIS — Z1211 Encounter for screening for malignant neoplasm of colon: Secondary | ICD-10-CM

## 2022-03-15 DIAGNOSIS — G250 Essential tremor: Secondary | ICD-10-CM

## 2022-03-15 DIAGNOSIS — E1159 Type 2 diabetes mellitus with other circulatory complications: Secondary | ICD-10-CM | POA: Diagnosis not present

## 2022-03-15 LAB — COMPREHENSIVE METABOLIC PANEL
ALT: 9 U/L (ref 0–53)
AST: 13 U/L (ref 0–37)
Albumin: 4.3 g/dL (ref 3.5–5.2)
Alkaline Phosphatase: 44 U/L (ref 39–117)
BUN: 11 mg/dL (ref 6–23)
CO2: 29 mEq/L (ref 19–32)
Calcium: 9.8 mg/dL (ref 8.4–10.5)
Chloride: 103 mEq/L (ref 96–112)
Creatinine, Ser: 0.97 mg/dL (ref 0.40–1.50)
GFR: 80.19 mL/min (ref >60.00)
Glucose, Bld: 157 mg/dL — ABNORMAL HIGH (ref 70–99)
Potassium: 3.9 mEq/L (ref 3.5–5.1)
Sodium: 139 mEq/L (ref 135–145)
Total Bilirubin: 0.4 mg/dL (ref 0.2–1.2)
Total Protein: 6.8 g/dL (ref 6.0–8.3)

## 2022-03-15 LAB — LIPID PANEL
Cholesterol: 108 mg/dL (ref 0–200)
HDL: 51.1 mg/dL (ref >39.00)
LDL Cholesterol: 41 mg/dL (ref 0–99)
NonHDL: 57.22
Total CHOL/HDL Ratio: 2
Triglycerides: 79 mg/dL (ref 0.0–149.0)
VLDL: 15.8 mg/dL (ref 0.0–40.0)

## 2022-03-15 LAB — MICROALBUMIN / CREATININE URINE RATIO
Creatinine,U: 91.9 mg/dL
Microalb Creat Ratio: 0.8 mg/g (ref 0.0–30.0)
Microalb, Ur: <0.7 mg/dL (ref 0.0–1.9)

## 2022-03-15 LAB — HEMOGLOBIN A1C: Hgb A1c MFr Bld: 6.7 % — ABNORMAL HIGH (ref 4.6–6.5)

## 2022-03-15 MED ORDER — DROPSAFE ALCOHOL PREP 70 % PADS: MEDICATED_PAD | 3 refills | Status: DC

## 2022-03-15 MED ORDER — TRUEPLUS LANCETS 33G MISC: 1 refills | Status: DC

## 2022-03-15 MED ORDER — TRUE METRIX BLOOD GLUCOSE TEST VI STRP: ORAL_STRIP | 1 refills | Status: DC

## 2022-03-15 NOTE — Assessment & Plan Note (Signed)
Well-controlled hypertension. BP Readings from Last 3 Encounters:  03/15/22 130/70  12/02/21 (!) 142/78  03/17/21 138/80  Continue lisinopril 40 mg daily and propranolol 10 mg 3 times a day. Blood work including hemoglobin A1c done today. Continue metformin 500 mg twice a day. Diet and nutrition discussed. Cardiovascular risks associated with hypertension and diabetes discussed. Follow-up in 3 months.

## 2022-03-15 NOTE — Assessment & Plan Note (Signed)
Intermittent.  Presently absent.  Advised to take Tylenol and or Advil as needed for pain. Stable.  No clinical concerns.

## 2022-03-15 NOTE — Assessment & Plan Note (Signed)
Stable.  Diet and nutrition discussed. Continue simvastatin 20 mg daily. Lipid profile done today.

## 2022-03-15 NOTE — Assessment & Plan Note (Signed)
Stable.  Continue propranolol 10 mg 3 times a day.

## 2022-03-22 ENCOUNTER — Other Ambulatory Visit: Payer: Self-pay | Admitting: *Deleted

## 2022-03-22 DIAGNOSIS — E1165 Type 2 diabetes mellitus with hyperglycemia: Secondary | ICD-10-CM

## 2022-03-22 MED ORDER — TRUE METRIX BLOOD GLUCOSE TEST VI STRP: ORAL_STRIP | 1 refills | Status: DC

## 2022-04-01 ENCOUNTER — Telehealth: Payer: Self-pay | Admitting: Gastroenterology

## 2022-04-01 NOTE — Telephone Encounter (Signed)
Dr. Ardis Hughs is Doc of Day for 03/15/22 PM.  Patient is being referred for colonoscopy but is also requesting EGD as well. Records placed on Dr. Ardis Hughs desk for review.

## 2022-04-02 NOTE — Telephone Encounter (Signed)
Left message informing patient of Dr. Ardis Hughs decision. I informed him that I would have his records at my desk if he would like to pick them up.

## 2022-05-12 ENCOUNTER — Other Ambulatory Visit: Payer: Self-pay | Admitting: Emergency Medicine

## 2022-05-27 ENCOUNTER — Other Ambulatory Visit: Payer: Self-pay | Admitting: Emergency Medicine

## 2022-05-27 DIAGNOSIS — E1165 Type 2 diabetes mellitus with hyperglycemia: Secondary | ICD-10-CM

## 2022-06-15 ENCOUNTER — Ambulatory Visit (INDEPENDENT_AMBULATORY_CARE_PROVIDER_SITE_OTHER): Payer: Medicare HMO | Admitting: Emergency Medicine

## 2022-06-15 ENCOUNTER — Encounter: Payer: Self-pay | Admitting: Emergency Medicine

## 2022-06-15 VITALS — BP 138/88 | HR 96 | Temp 98.2°F | Ht 67.5 in | Wt 145.0 lb

## 2022-06-15 DIAGNOSIS — I152 Hypertension secondary to endocrine disorders: Secondary | ICD-10-CM | POA: Diagnosis not present

## 2022-06-15 DIAGNOSIS — E1159 Type 2 diabetes mellitus with other circulatory complications: Secondary | ICD-10-CM

## 2022-06-15 DIAGNOSIS — E1169 Type 2 diabetes mellitus with other specified complication: Secondary | ICD-10-CM

## 2022-06-15 DIAGNOSIS — F17200 Nicotine dependence, unspecified, uncomplicated: Secondary | ICD-10-CM

## 2022-06-15 DIAGNOSIS — Z1211 Encounter for screening for malignant neoplasm of colon: Secondary | ICD-10-CM | POA: Diagnosis not present

## 2022-06-15 DIAGNOSIS — E1165 Type 2 diabetes mellitus with hyperglycemia: Secondary | ICD-10-CM | POA: Diagnosis not present

## 2022-06-15 DIAGNOSIS — E785 Hyperlipidemia, unspecified: Secondary | ICD-10-CM | POA: Diagnosis not present

## 2022-06-15 LAB — POCT GLYCOSYLATED HEMOGLOBIN (HGB A1C): Hemoglobin A1C: 6.8 % — AB (ref 4.0–5.6)

## 2022-06-15 NOTE — Progress Notes (Signed)
Troy Little 71 y.o.   Chief Complaint  Patient presents with   Follow-up    43mth f/u appt     HISTORY OF PRESENT ILLNESS: This is a 71y.o. male here for 335-monthollow-up of diabetes hypertension and dyslipidemia Lab Results  Component Value Date   HGBA1C 6.7 (H) 03/15/2022   BP Readings from Last 3 Encounters:  06/15/22 138/88  03/15/22 130/70  12/02/21 (!) 142/78     HPI   Prior to Admission medications   Medication Sig Start Date End Date Taking? Authorizing Provider  Alcohol Swabs (DROPSAFE ALCOHOL PREP) 70 % PADS APPLY TO AFFECTED AREA AS DIRECTED 03/15/22  Yes Madissen Wyse, MiInes BloomerMD  aspirin EC 81 MG tablet Take 81 mg by mouth daily.   Yes [provider]  blood glucose meter kit and supplies Dispense based on patient and insurance preference. Use to test in the morning, noon, and bedtime as directed. (FOR ICD-10 E10.9, E11.9). 10/30/20  Yes MuMarrian SalvageFNP  cetirizine (ZYRTEC) 10 MG tablet TAKE 1 TABLET EVERY DAY 02/23/22  Yes SaHorald PollenMD  clopidogrel (PLAVIX) 75 MG tablet Take 1 tablet by mouth once daily 08/22/20  Yes CoSherren MochaMD  dextromethorphan-guaiFENesin (ROBITUSSIN-DM) 10-100 MG/5ML liquid Take 5 mLs by mouth every 6 (six) hours as needed for cough. 07/02/21  Yes JoBiagio BorgMD  famotidine (PEPCID) 20 MG tablet TAKE 1 TABLET TWICE DAILY 07/22/21  Yes MuMarrian SalvageFNP  lisinopril (ZESTRIL) 40 MG tablet Take 1 tablet (40 mg total) by mouth daily. Overdue for Annual appt must see provider for future refills 07/14/20  Yes MuMarrian SalvageFNP  metFORMIN (GLUCOPHAGE) 500 MG tablet Take 1 tablet (500 mg total) by mouth daily with breakfast. Per patient, he takes 1/4 tablet per day "when he needs it" 11/10/18  Yes MuValere DrossLaMarvis RepressFNP  Omega-3 Fatty Acids (FISH OIL) 500 MG CAPS Take 500 mg by mouth 3 (three) times daily.    Yes [provider]  predniSONE (DELTASONE) 10 MG tablet 3 tabs by  mouth per day for 3 days,2tabs per day for 3 days,1tab per day for 3 days 07/01/21  Yes JoBiagio BorgMD  primidone (MYSOLINE) 50 MG tablet Take 1 tablet (50 mg total) by mouth 2 (two) times daily. Per patient, takes  1 1/2 tablet in the am and 1 tablet in the afternoon 11/10/18  Yes MuValere DrossLaMarvis RepressFNP  propranolol (INDERAL) 10 MG tablet Patient takes 1 1/2 tablet in the am, 1 in the afternoon, 1 in the evening 11/10/18  Yes MuMarrian SalvageFNP  simvastatin (ZOCOR) 20 MG tablet Take 20 mg by mouth at bedtime.    Yes [provider]  triamcinolone cream (KENALOG) 0.1 % Apply 1 application topically 2 (two) times daily. 04/25/20  Yes CrHoyt KochMD  TRUE METRIX BLOOD GLUCOSE TEST test strip USE AS DIRECTED 05/27/22  Yes Aramis Weil, MiInes BloomerMD  TRUEplus Lancets 33G MISC USE PER INSTRUCTIONS. 05/12/22  Yes SaHorald PollenMD    Allergies  Allergen Reactions   Prozac [Fluoxetine Hcl] Other (See Comments)    agitation   Dye Fdc Red [Red Dye] Rash    Patient Active Problem List   Diagnosis Date Noted   Left sided sciatica 03/15/2022   Current smoker 03/15/2022   Angina pectoris ([facility]   Hypertension associated with diabetes ([id number] Diastolic dysfunction 0288/[id number] Hyperlipidemia 11/01/2015   Chronic  neck pain 11/01/2015   TIA (transient ischemic attack) 10/31/2015   Dyslipidemia associated with type 2 diabetes mellitus ([place]) 10/31/2015   Anxiety and depression 10/31/2015   Tobacco use 10/31/2015   Essential tremor 10/31/2015    Past Medical History:  Diagnosis Date   Agoraphobia    Anxiety    Coronary artery disease    Diabetes mellitus without complication ([place])    Essential Tremor    Hypercholesteremia    MDD (major depressive disorder)    Psychosis ([place])     Past Surgical History:  Procedure Laterality Date   CARDIAC CATHETERIZATION     CHOLECYSTECTOMY     CORONARY STENT INTERVENTION N/A 09/24/2019   Procedure: CORONARY  STENT INTERVENTION;  Surgeon: Jettie Booze, MD;  Location: [facility];  Service: Cardiovascular;  Laterality: N/A;   LEFT HEART CATH N/A 09/24/2019   Procedure: Left Heart Cath;  Surgeon: Jettie Booze, MD;  Location: [facility];  Service: Cardiovascular;  Laterality: N/A;    Social History   Socioeconomic History   Marital status: Married    Spouse name: Not on file   Number of children: Not on file   Years of education: 12   Highest education level: Some college, no degree  Occupational History   Occupation: Disabled  Tobacco Use   Smoking status: Every Day    Packs/day: 0.50    Years: 47.00    Total pack years: 23.50    Types: Cigarettes   Smokeless tobacco: Never   Tobacco comments:    Patient given 1-800-quit-now for smoking cessation  Substance and Sexual Activity   Alcohol use: Yes   Drug use: No    Types: Cocaine    Comment: denies   Sexual activity: Not on file  Other Topics Concern   Not on file  Social History Narrative   Not on file   Social Determinants of Health   Financial Resource Strain: Not on file  Food Insecurity: Not on file  Transportation Needs: Not on file  Physical Activity: Not on file  Stress: Not on file  Social Connections: Not on file  Intimate Partner Violence: Not on file    Family History  Problem Relation Age of Onset   Cancer Mother      Review of Systems  Constitutional: Negative.  Negative for chills and fever.  HENT: Negative.  Negative for sore throat.   Respiratory: Negative.    Cardiovascular: Negative.  Negative for chest pain and palpitations.  Gastrointestinal: Negative.   Genitourinary: Negative.   Musculoskeletal: Negative.   Skin: Negative.  Negative for rash.  Neurological:  Negative for dizziness and headaches.  All other systems reviewed and are negative.  Today's Vitals   06/15/22 1433  BP: 138/88  Pulse: 96  Temp: 98.2 F (36.8 C)  TempSrc: Oral  SpO2: 95%  Weight:  145 lb (65.8 kg)  Height: 5' 7.5" (1.715 m)   Body mass index is 22.38 kg/m.   Physical Exam Vitals reviewed.  Constitutional:      Appearance: Normal appearance.  HENT:     Head: Normocephalic.     Mouth/Throat:     Mouth: Mucous membranes are moist.     Pharynx: Oropharynx is clear.  Eyes:     Extraocular Movements: Extraocular movements intact.     Conjunctiva/sclera: Conjunctivae normal.     Pupils: Pupils are equal, round, and reactive to light.  Cardiovascular:     Rate and Rhythm: Normal rate and  regular rhythm.     Pulses: Normal pulses.     Heart sounds: Normal heart sounds.  Pulmonary:     Effort: Pulmonary effort is normal.     Breath sounds: Normal breath sounds.  Abdominal:     Palpations: Abdomen is soft.     Tenderness: There is no abdominal tenderness.  Musculoskeletal:        General: Normal range of motion.     Cervical back: No tenderness.  Lymphadenopathy:     Cervical: No cervical adenopathy.  Skin:    General: Skin is warm and dry.     Capillary Refill: Capillary refill takes less than 2 seconds.  Neurological:     General: No focal deficit present.     Mental Status: He is alert and oriented to person, place, and time.  Psychiatric:        Mood and Affect: Mood normal.        Behavior: Behavior normal.    Results for orders placed or performed in visit on 06/15/22 (from the past 24 hour(s))  POCT HgB A1C     Status: Abnormal   Collection Time: 06/15/22  3:06 PM  Result Value Ref Range   Hemoglobin A1C 6.8 (A) 4.0 - 5.6 %   HbA1c POC (<> result, manual entry)     HbA1c, POC (prediabetic range)     HbA1c, POC (controlled diabetic range)       ASSESSMENT & PLAN: A total of 47 minutes was spent with the patient and counseling/coordination of care regarding preparing for this visit, review of most recent office visit notes, review of multiple chronic medical problems and their management, review of all medications, review of most recent  blood work results including interpretation of today's hemoglobin A1c, education on nutrition, cardiovascular risks associated with diabetes and hypertension, prognosis, documentation, and need for follow-up.  Problem List Items Addressed This Visit       Cardiovascular and Mediastinum   Hypertension associated with diabetes ([place]) - Primary    Well-controlled hypertension. Continue lisinopril 40 mg daily and propranolol 10 mg 3 times a day. Well-controlled diabetes with hemoglobin A1c of 6.8. Continue metformin 500 mg twice a day Cardiovascular risks associated with hypertension and diabetes discussed. Diet and nutrition discussed. Follow up in 6 months.        Endocrine   Dyslipidemia associated with type 2 diabetes mellitus ([place])    Stable.  Diet and nutrition discussed. Continue simvastatin 20 mg daily.        Other   Current smoker    States he is smoking less. Cardiovascular and cancer risks associated with smoking discussed. Smoking cessation advice given.      Other Visit Diagnoses     Type 2 diabetes mellitus with hyperglycemia, without long-term current use of insulin (HCC)       Relevant Orders   POCT HgB A1C (Completed)   Colon cancer screening       Relevant Orders   Ambulatory referral to Gastroenterology        Patient Instructions  Hypertension, Adult High blood pressure (hypertension) is when the force of blood pumping through the arteries is too strong. The arteries are the blood vessels that carry blood from the heart throughout the body. Hypertension forces the heart to work harder to pump blood and may cause arteries to become narrow or stiff. Untreated or uncontrolled hypertension can lead to a heart attack, heart failure, a stroke, kidney disease, and other problems. A blood pressure  reading consists of a higher number over a lower number. Ideally, your blood pressure should be below 120/80. The first ("top") number is called the systolic  pressure. It is a measure of the pressure in your arteries as your heart beats. The second ("bottom") number is called the diastolic pressure. It is a measure of the pressure in your arteries as the heart relaxes. What are the causes? The exact cause of this condition is not known. There are some conditions that result in high blood pressure. What increases the risk? Certain factors may make you more likely to develop high blood pressure. Some of these risk factors are under your control, including: Smoking. Not getting enough exercise or physical activity. Being overweight. Having too much fat, sugar, calories, or salt (sodium) in your diet. Drinking too much alcohol. Other risk factors include: Having a personal history of heart disease, diabetes, high cholesterol, or kidney disease. Stress. Having a family history of high blood pressure and high cholesterol. Having obstructive sleep apnea. Age. The risk increases with age. What are the signs or symptoms? High blood pressure may not cause symptoms. Very high blood pressure (hypertensive crisis) may cause: Headache. Fast or irregular heartbeats (palpitations). Shortness of breath. Nosebleed. Nausea and vomiting. Vision changes. Severe chest pain, dizziness, and seizures. How is this diagnosed? This condition is diagnosed by measuring your blood pressure while you are seated, with your arm resting on a flat surface, your legs uncrossed, and your feet flat on the floor. The cuff of the blood pressure monitor will be placed directly against the skin of your upper arm at the level of your heart. Blood pressure should be measured at least twice using the same arm. Certain conditions can cause a difference in blood pressure between your right and left arms. If you have a high blood pressure reading during one visit or you have normal blood pressure with other risk factors, you may be asked to: Return on a different day to have your blood  pressure checked again. Monitor your blood pressure at home for 1 week or longer. If you are diagnosed with hypertension, you may have other blood or imaging tests to help your health care provider understand your overall risk for other conditions. How is this treated? This condition is treated by making healthy lifestyle changes, such as eating healthy foods, exercising more, and reducing your alcohol intake. You may be referred for counseling on a healthy diet and physical activity. Your health care provider may prescribe medicine if lifestyle changes are not enough to get your blood pressure under control and if: Your systolic blood pressure is above 130. Your diastolic blood pressure is above 80. Your personal target blood pressure may vary depending on your medical conditions, your age, and other factors. Follow these instructions at home: Eating and drinking  Eat a diet that is high in fiber and potassium, and low in sodium, added sugar, and fat. An example of this eating plan is called the DASH diet. DASH stands for Dietary Approaches to Stop Hypertension. To eat this way: Eat plenty of fresh fruits and vegetables. Try to fill one half of your plate at each meal with fruits and vegetables. Eat whole grains, such as whole-wheat pasta, brown rice, or whole-grain bread. Fill about one fourth of your plate with whole grains. Eat or drink low-fat dairy products, such as skim milk or low-fat yogurt. Avoid fatty cuts of meat, processed or cured meats, and poultry with skin. Fill about one fourth of  your plate with lean proteins, such as fish, chicken without skin, beans, eggs, or tofu. Avoid pre-made and processed foods. These tend to be higher in sodium, added sugar, and fat. Reduce your daily sodium intake. Many people with hypertension should eat less than 1,500 mg of sodium a day. Do not drink alcohol if: Your health care provider tells you not to drink. You are pregnant, may be pregnant, or  are planning to become pregnant. If you drink alcohol: Limit how much you have to: 0-1 drink a day for women. 0-2 drinks a day for men. Know how much alcohol is in your drink. In the [place] one drink equals one 12 oz bottle of beer (355 mL), one 5 oz glass of wine (148 mL), or one 1 oz glass of hard liquor (44 mL). Lifestyle  Work with your health care provider to maintain a healthy body weight or to lose weight. Ask what an ideal weight is for you. Get at least 30 minutes of exercise that causes your heart to beat faster (aerobic exercise) most days of the week. Activities may include walking, swimming, or biking. Include exercise to strengthen your muscles (resistance exercise), such as Pilates or lifting weights, as part of your weekly exercise routine. Try to do these types of exercises for 30 minutes at least 3 days a week. Do not use any products that contain nicotine or tobacco. These products include cigarettes, chewing tobacco, and vaping devices, such as e-cigarettes. If you need help quitting, ask your health care provider. Monitor your blood pressure at home as told by your health care provider. Keep all follow-up visits. This is important. Medicines Take over-the-counter and prescription medicines only as told by your health care provider. Follow directions carefully. Blood pressure medicines must be taken as prescribed. Do not skip doses of blood pressure medicine. Doing this puts you at risk for problems and can make the medicine less effective. Ask your health care provider about side effects or reactions to medicines that you should watch for. Contact a health care provider if you: Think you are having a reaction to a medicine you are taking. Have headaches that keep coming back (recurring). Feel dizzy. Have swelling in your ankles. Have trouble with your vision. Get help right away if you: Develop a severe headache or confusion. Have unusual weakness or numbness. Feel  faint. Have severe pain in your chest or abdomen. Vomit repeatedly. Have trouble breathing. These symptoms may be an emergency. Get help right away. Call 911. Do not wait to see if the symptoms will go away. Do not drive yourself to the hospital. Summary Hypertension is when the force of blood pumping through your arteries is too strong. If this condition is not controlled, it may put you at risk for serious complications. Your personal target blood pressure may vary depending on your medical conditions, your age, and other factors. For most people, a normal blood pressure is less than 120/80. Hypertension is treated with lifestyle changes, medicines, or a combination of both. Lifestyle changes include losing weight, eating a healthy, low-sodium diet, exercising more, and limiting alcohol. This information is not intended to replace advice given to you by your health care provider. Make sure you discuss any questions you have with your health care provider. Document Revised: 07/14/2021 Document Reviewed: 07/14/2021 Elsevier Patient Education  [place], MD [facility]

## 2022-06-15 NOTE — Patient Instructions (Signed)
Hypertension, Adult High blood pressure (hypertension) is when the force of blood pumping through the arteries is too strong. The arteries are the blood vessels that carry blood from the heart throughout the body. Hypertension forces the heart to work harder to pump blood and may cause arteries to become narrow or stiff. Untreated or uncontrolled hypertension can lead to a heart attack, heart failure, a stroke, kidney disease, and other problems. A blood pressure reading consists of a higher number over a lower number. Ideally, your blood pressure should be below 120/80. The first ("top") number is called the systolic pressure. It is a measure of the pressure in your arteries as your heart beats. The second ("bottom") number is called the diastolic pressure. It is a measure of the pressure in your arteries as the heart relaxes. What are the causes? The exact cause of this condition is not known. There are some conditions that result in high blood pressure. What increases the risk? Certain factors may make you more likely to develop high blood pressure. Some of these risk factors are under your control, including: Smoking. Not getting enough exercise or physical activity. Being overweight. Having too much fat, sugar, calories, or salt (sodium) in your diet. Drinking too much alcohol. Other risk factors include: Having a personal history of heart disease, diabetes, high cholesterol, or kidney disease. Stress. Having a family history of high blood pressure and high cholesterol. Having obstructive sleep apnea. Age. The risk increases with age. What are the signs or symptoms? High blood pressure may not cause symptoms. Very high blood pressure (hypertensive crisis) may cause: Headache. Fast or irregular heartbeats (palpitations). Shortness of breath. Nosebleed. Nausea and vomiting. Vision changes. Severe chest pain, dizziness, and seizures. How is this diagnosed? This condition is diagnosed by  measuring your blood pressure while you are seated, with your arm resting on a flat surface, your legs uncrossed, and your feet flat on the floor. The cuff of the blood pressure monitor will be placed directly against the skin of your upper arm at the level of your heart. Blood pressure should be measured at least twice using the same arm. Certain conditions can cause a difference in blood pressure between your right and left arms. If you have a high blood pressure reading during one visit or you have normal blood pressure with other risk factors, you may be asked to: Return on a different day to have your blood pressure checked again. Monitor your blood pressure at home for 1 week or longer. If you are diagnosed with hypertension, you may have other blood or imaging tests to help your health care provider understand your overall risk for other conditions. How is this treated? This condition is treated by making healthy lifestyle changes, such as eating healthy foods, exercising more, and reducing your alcohol intake. You may be referred for counseling on a healthy diet and physical activity. Your health care provider may prescribe medicine if lifestyle changes are not enough to get your blood pressure under control and if: Your systolic blood pressure is above 130. Your diastolic blood pressure is above 80. Your personal target blood pressure may vary depending on your medical conditions, your age, and other factors. Follow these instructions at home: Eating and drinking  Eat a diet that is high in fiber and potassium, and low in sodium, added sugar, and fat. An example of this eating plan is called the DASH diet. DASH stands for Dietary Approaches to Stop Hypertension. To eat this way: Eat   plenty of fresh fruits and vegetables. Try to fill one half of your plate at each meal with fruits and vegetables. Eat whole grains, such as whole-wheat pasta, brown rice, or whole-grain bread. Fill about one  fourth of your plate with whole grains. Eat or drink low-fat dairy products, such as skim milk or low-fat yogurt. Avoid fatty cuts of meat, processed or cured meats, and poultry with skin. Fill about one fourth of your plate with lean proteins, such as fish, chicken without skin, beans, eggs, or tofu. Avoid pre-made and processed foods. These tend to be higher in sodium, added sugar, and fat. Reduce your daily sodium intake. Many people with hypertension should eat less than 1,500 mg of sodium a day. Do not drink alcohol if: Your health care provider tells you not to drink. You are pregnant, may be pregnant, or are planning to become pregnant. If you drink alcohol: Limit how much you have to: 0-1 drink a day for women. 0-2 drinks a day for men. Know how much alcohol is in your drink. In the [place] one drink equals one 12 oz bottle of beer (355 mL), one 5 oz glass of wine (148 mL), or one 1 oz glass of hard liquor (44 mL). Lifestyle  Work with your health care provider to maintain a healthy body weight or to lose weight. Ask what an ideal weight is for you. Get at least 30 minutes of exercise that causes your heart to beat faster (aerobic exercise) most days of the week. Activities may include walking, swimming, or biking. Include exercise to strengthen your muscles (resistance exercise), such as Pilates or lifting weights, as part of your weekly exercise routine. Try to do these types of exercises for 30 minutes at least 3 days a week. Do not use any products that contain nicotine or tobacco. These products include cigarettes, chewing tobacco, and vaping devices, such as e-cigarettes. If you need help quitting, ask your health care provider. Monitor your blood pressure at home as told by your health care provider. Keep all follow-up visits. This is important. Medicines Take over-the-counter and prescription medicines only as told by your health care provider. Follow directions carefully. Blood  pressure medicines must be taken as prescribed. Do not skip doses of blood pressure medicine. Doing this puts you at risk for problems and can make the medicine less effective. Ask your health care provider about side effects or reactions to medicines that you should watch for. Contact a health care provider if you: Think you are having a reaction to a medicine you are taking. Have headaches that keep coming back (recurring). Feel dizzy. Have swelling in your ankles. Have trouble with your vision. Get help right away if you: Develop a severe headache or confusion. Have unusual weakness or numbness. Feel faint. Have severe pain in your chest or abdomen. Vomit repeatedly. Have trouble breathing. These symptoms may be an emergency. Get help right away. Call 911. Do not wait to see if the symptoms will go away. Do not drive yourself to the hospital. Summary Hypertension is when the force of blood pumping through your arteries is too strong. If this condition is not controlled, it may put you at risk for serious complications. Your personal target blood pressure may vary depending on your medical conditions, your age, and other factors. For most people, a normal blood pressure is less than 120/80. Hypertension is treated with lifestyle changes, medicines, or a combination of both. Lifestyle changes include losing weight, eating a healthy,   low-sodium diet, exercising more, and limiting alcohol. This information is not intended to replace advice given to you by your health care provider. Make sure you discuss any questions you have with your health care provider. Document Revised: 07/14/2021 Document Reviewed: 07/14/2021 Elsevier Patient Education  2023 [employer]  

## 2022-06-15 NOTE — Assessment & Plan Note (Signed)
States he is smoking less. Cardiovascular and cancer risks associated with smoking discussed. Smoking cessation advice given.

## 2022-06-15 NOTE — Assessment & Plan Note (Signed)
Well-controlled hypertension. Continue lisinopril 40 mg daily and propranolol 10 mg 3 times a day. Well-controlled diabetes with hemoglobin A1c of 6.8. Continue metformin 500 mg twice a day Cardiovascular risks associated with hypertension and diabetes discussed. Diet and nutrition discussed. Follow up in 6 months.

## 2022-06-15 NOTE — Assessment & Plan Note (Signed)
Stable.  Diet and nutrition discussed.  Continue simvastatin 20 mg daily. 

## 2022-06-28 ENCOUNTER — Other Ambulatory Visit: Payer: Self-pay | Admitting: *Deleted

## 2022-06-28 DIAGNOSIS — Z1211 Encounter for screening for malignant neoplasm of colon: Secondary | ICD-10-CM

## 2022-07-19 ENCOUNTER — Other Ambulatory Visit (HOSPITAL_COMMUNITY): Payer: Self-pay | Admitting: Internal Medicine

## 2022-07-19 DIAGNOSIS — Z8669 Personal history of other diseases of the nervous system and sense organs: Secondary | ICD-10-CM

## 2022-07-21 ENCOUNTER — Telehealth: Payer: Self-pay | Admitting: Emergency Medicine

## 2022-07-21 NOTE — Telephone Encounter (Signed)
Thanks

## 2022-07-21 NOTE — Telephone Encounter (Signed)
Patient called in and was complaining that he has a headache he hasnt been able to get rid of for 2 weeks and that he has also been light headed and nauseous. He said nothing OTC has worked including half a pill of hydrocodone he took of a friends. I scheduled him for when he said he could get in next which was Monday 07/26/22, I put him with Dr Jenny Reichmann due to PCP being full that day. I did send him to the triage nurse just because of symptoms to make sure it wasn't something he needed to be seen sooner for.I did also ask him about BP and blood sugar and he said they were both fine.

## 2022-07-22 ENCOUNTER — Ambulatory Visit (INDEPENDENT_AMBULATORY_CARE_PROVIDER_SITE_OTHER): Payer: Medicare HMO | Admitting: Internal Medicine

## 2022-07-22 ENCOUNTER — Encounter: Payer: Self-pay | Admitting: Internal Medicine

## 2022-07-22 VITALS — BP 120/68 | HR 85 | Temp 98.5°F | Ht 67.5 in | Wt 147.0 lb

## 2022-07-22 DIAGNOSIS — R739 Hyperglycemia, unspecified: Secondary | ICD-10-CM | POA: Diagnosis not present

## 2022-07-22 DIAGNOSIS — R519 Headache, unspecified: Secondary | ICD-10-CM | POA: Diagnosis not present

## 2022-07-22 DIAGNOSIS — F17200 Nicotine dependence, unspecified, uncomplicated: Secondary | ICD-10-CM

## 2022-07-22 MED ORDER — AMOXICILLIN-POT CLAVULANATE 875-125 MG PO TABS: 1.0000 | ORAL_TABLET | Freq: Two times a day (BID) | ORAL | 0 refills | Status: DC

## 2022-07-22 NOTE — Assessment & Plan Note (Signed)
2 wks onset but resolved today, exam benign, ok to follow

## 2022-07-22 NOTE — Progress Notes (Signed)
Patient ID: Troy Little, male   DOB: Mar 17, 1951, 71 y.o.   MRN: [id number]        Chief Complaint: follow up HA x 2 wks now with left facial pain and swelling       HPI:  Troy Little is a 71 y.o. male here with c/o HA for 2 wks, that for some reason has resolved today, but since he is here, he does have left facial tender swelling x 2-3 days now with today increased tender lump under the left jawline as well, but denies fever, chillls, skin change or drainage.  Denies specific sinus pain or congestion.  Has ongoing mild dental irritations.   Pt denies chest pain, increased sob or doe, wheezing, orthopnea, PND, increased LE swelling, palpitations, dizziness or syncope.   Pt denies polydipsia, polyuria, or new focal neuro s/s.   Still smoking, not ready to quit.         Wt Readings from Last 3 Encounters:  07/22/22 147 lb (66.7 kg)  06/15/22 145 lb (65.8 kg)  03/15/22 149 lb 2 oz (67.6 kg)   BP Readings from Last 3 Encounters:  07/22/22 120/68  06/15/22 138/88  03/15/22 130/70         Past Medical History:  Diagnosis Date   Agoraphobia    Anxiety    Coronary artery disease    Diabetes mellitus without complication ([place])    Essential Tremor    Hypercholesteremia    MDD (major depressive disorder)    Psychosis ([place])    Past Surgical History:  Procedure Laterality Date   CARDIAC CATHETERIZATION     CHOLECYSTECTOMY     CORONARY STENT INTERVENTION N/A 09/24/2019   Procedure: CORONARY STENT INTERVENTION;  Surgeon: Jettie Booze, MD;  Location: [facility];  Service: Cardiovascular;  Laterality: N/A;   LEFT HEART CATH N/A 09/24/2019   Procedure: Left Heart Cath;  Surgeon: Jettie Booze, MD;  Location: [facility];  Service: Cardiovascular;  Laterality: N/A;    reports that he has been smoking cigarettes. He has a 23.50 pack-year smoking history. He has never used smokeless tobacco. He reports current alcohol use. He reports that he does not use  drugs. family history includes Cancer in his mother. Allergies  Allergen Reactions   Prozac [Fluoxetine Hcl] Other (See Comments)    agitation   Dye Fdc Red [Red Dye] Rash   Current Outpatient Medications on File Prior to Visit  Medication Sig Dispense Refill   Alcohol Swabs (DROPSAFE ALCOHOL PREP) 70 % PADS APPLY TO AFFECTED AREA AS DIRECTED 200 each 3   aspirin EC 81 MG tablet Take 81 mg by mouth daily.     blood glucose meter kit and supplies Dispense based on patient and insurance preference. Use to test in the morning, noon, and bedtime as directed. (FOR ICD-10 E10.9, E11.9). 1 each 0   cetirizine (ZYRTEC) 10 MG tablet TAKE 1 TABLET EVERY DAY 90 tablet 1   clopidogrel (PLAVIX) 75 MG tablet Take 1 tablet by mouth once daily 90 tablet 2   dextromethorphan-guaiFENesin (ROBITUSSIN-DM) 10-100 MG/5ML liquid Take 5 mLs by mouth every 6 (six) hours as needed for cough. 180 mL 0   famotidine (PEPCID) 20 MG tablet TAKE 1 TABLET TWICE DAILY 180 tablet 0   lisinopril (ZESTRIL) 40 MG tablet Take 1 tablet (40 mg total) by mouth daily. Overdue for Annual appt must see provider for future refills 30 tablet 0   metFORMIN (GLUCOPHAGE) 500 MG  tablet Take 1 tablet (500 mg total) by mouth daily with breakfast. Per patient, he takes 1/4 tablet per day "when he needs it"     Omega-3 Fatty Acids (FISH OIL) 500 MG CAPS Take 500 mg by mouth 3 (three) times daily.      predniSONE (DELTASONE) 10 MG tablet 3 tabs by mouth per day for 3 days,2tabs per day for 3 days,1tab per day for 3 days 18 tablet 0   primidone (MYSOLINE) 50 MG tablet Take 1 tablet (50 mg total) by mouth 2 (two) times daily. Per patient, takes  1 1/2 tablet in the am and 1 tablet in the afternoon     propranolol (INDERAL) 10 MG tablet Patient takes 1 1/2 tablet in the am, 1 in the afternoon, 1 in the evening     simvastatin (ZOCOR) 20 MG tablet Take 20 mg by mouth at bedtime.      triamcinolone cream (KENALOG) 0.1 % Apply 1 application topically 2  (two) times daily. 100 g 2   TRUE METRIX BLOOD GLUCOSE TEST test strip USE AS DIRECTED 100 strip 2   TRUEplus Lancets 33G MISC USE PER INSTRUCTIONS. 100 each 1   No current facility-administered medications on file prior to visit.        ROS:  All others reviewed and negative.  Objective        PE:  BP 120/68   Pulse 85   Temp 98.5 F (36.9 C)   Ht 5' 7.5" (1.715 m)   Wt 147 lb (66.7 kg)   SpO2 97%   BMI 22.68 kg/m                 Constitutional: Pt appears in NAD               HENT: Head: NCAT.                Right Ear: External ear normal.                 Left Ear: External ear normal.                Eyes: . Pupils are equal, round, and reactive to light. Conjunctivae and EOM are normal;  left cheek with nondiscrete large area mild tender swelling, worse at the jawline with tender left submandibular lymph nodes increased               Nose: without d/c or deformity               Neck: Neck supple. Gross normal ROM               Cardiovascular: Normal rate and regular rhythm.                 Pulmonary/Chest: Effort normal and breath sounds without rales or wheezing.                               Neurological: Pt is alert. At baseline orientation, motor grossly intact               Skin: Skin is warm. No rashes, no other new lesions, LE edema - none               Psychiatric: Pt behavior is normal without agitation   Micro: none  Cardiac tracings I have personally interpreted today:  none  Pertinent Radiological findings (summarize): none  Lab Results  Component Value Date   WBC 5.0 09/24/2019   HGB 14.6 09/24/2019   HCT 44.8 09/24/2019   PLT 185 09/24/2019   GLUCOSE 157 (H) 03/15/2022   CHOL 108 03/15/2022   TRIG 79.0 03/15/2022   HDL 51.10 03/15/2022   LDLCALC 41 03/15/2022   ALT 9 03/15/2022   AST 13 03/15/2022   NA 139 03/15/2022   K 3.9 03/15/2022   CL 103 03/15/2022   CREATININE 0.97 03/15/2022   BUN 11 03/15/2022   CO2 29 03/15/2022   TSH 1.00  05/08/2019   PSA 0.43 10/03/2018   INR 1.04 10/31/2015   HGBA1C 6.8 (A) 06/15/2022   MICROALBUR <0.7 03/15/2022   Assessment/Plan:  Troy Little is a 71 y.o. Black or African American [2] male with  has a past medical history of Agoraphobia, Anxiety, Coronary artery disease, Diabetes mellitus without complication ([place]), Essential Tremor, Hypercholesteremia, MDD (major depressive disorder), and Psychosis ([place]).  Acute facial pain Left side, etiology unclear but suspect dental related or parotitis - for augmentin course x 10 days,  to f/u any worsening symptoms or concerns  Headache 2 wks onset but resolved today, exam benign, ok to follow  Current smoker Pt counsled to quit, pt not ready  Hyperglycemia Lab Results  Component Value Date   HGBA1C 6.8 (A) 06/15/2022   Stable, pt to continue current medical treatment metformin 500 mg qd   Followup: Return if symptoms worsen or fail to improve.  Cathlean Cower, MD 07/22/2022 6:56 PM [facility]

## 2022-07-22 NOTE — Assessment & Plan Note (Signed)
Pt counsled to quit, pt not ready °

## 2022-07-22 NOTE — Assessment & Plan Note (Signed)
Lab Results  Component Value Date   HGBA1C 6.8 (A) 06/15/2022   Stable, pt to continue current medical treatment metformin 500 mg qd

## 2022-07-22 NOTE — Assessment & Plan Note (Signed)
Left side, etiology unclear but suspect dental related or parotitis - for augmentin course x 10 days,  to f/u any worsening symptoms or concerns

## 2022-07-22 NOTE — Patient Instructions (Signed)
Please take all new medication as prescribed - the antibiotic  Please continue all other medications as before, and refills have been done if requested.  Please have the pharmacy call with any other refills you may need.  Please keep your appointments with your specialists as you may have planned   

## 2022-07-28 ENCOUNTER — Other Ambulatory Visit: Payer: Medicare HMO

## 2022-07-28 NOTE — Progress Notes (Signed)
Subjective:   Troy Little is a 71 y.o. male who presents for an Initial Medicare Annual Wellness Visit. I connected with  Shyrl Numbers on 07/30/22 by a audio enabled telemedicine application and verified that I am speaking with the correct person using two identifiers.  Patient Location: Home  Provider Location: Home Office  I discussed the limitations of evaluation and management by telemedicine. The patient expressed understanding and agreed to proceed.  Review of Systems    Deferred to PCP Cardiac Risk Factors include: advanced age (>61mn, >>71women);diabetes mellitus;dyslipidemia;male gender;hypertension     Objective:    Today's Vitals   07/30/22 0928  PainSc: 4    There is no height or weight on file to calculate BMI.     07/30/2022   11:53 AM 09/24/2019    8:55 AM 08/31/2019    6:58 PM 05/06/2017   10:53 AM 12/08/2016    8:11 PM 11/28/2016    6:56 PM 10/31/2015    2:43 PM  Advanced Directives  Does Patient Have a Medical Advance Directive? _0  No No  Would patient like information on creating a medical advance directive? No - Patient declined No - Patient declined     No - patient declined information    Current Medications (verified) Outpatient Encounter Medications as of 07/30/2022  Medication Sig   Alcohol Swabs (DROPSAFE ALCOHOL PREP) 70 % PADS APPLY TO AFFECTED AREA AS DIRECTED   amLODipine (NORVASC) 5 MG tablet Take 1 tablet by mouth daily.   amoxicillin-clavulanate (AUGMENTIN) 875-125 MG tablet Take 1 tablet by mouth 2 (two) times daily.   aspirin EC 81 MG tablet Take 81 mg by mouth daily.   blood glucose meter kit and supplies Dispense based on patient and insurance preference. Use to test in the morning, noon, and bedtime as directed. (FOR ICD-10 E10.9, E11.9).   cetirizine (ZYRTEC) 10 MG tablet TAKE 1 TABLET EVERY DAY   clopidogrel (PLAVIX) 75 MG tablet Take 1 tablet by mouth once daily   famotidine (PEPCID) 20 MG tablet TAKE 1 TABLET  TWICE DAILY   isosorbide mononitrate (IMDUR) 30 MG 24 hr tablet TAKE ONE-HALF TABLET BY MOUTH ONCE A DAY   lisinopril (ZESTRIL) 40 MG tablet Take 1 tablet (40 mg total) by mouth daily. Overdue for Annual appt must see provider for future refills   metFORMIN (GLUCOPHAGE) 500 MG tablet Take 1 tablet (500 mg total) by mouth daily with breakfast. Per patient, he takes 1/4 tablet per day "when he needs it"   Omega-3 Fatty Acids (FISH OIL) 500 MG CAPS Take 500 mg by mouth 3 (three) times daily.    primidone (MYSOLINE) 50 MG tablet Take 1 tablet (50 mg total) by mouth 2 (two) times daily. Per patient, takes  1 1/2 tablet in the am and 1 tablet in the afternoon   propranolol (INDERAL) 10 MG tablet Patient takes 1 1/2 tablet in the am, 1 in the afternoon, 1 in the evening   simvastatin (ZOCOR) 20 MG tablet Take 20 mg by mouth at bedtime.    sucralfate (CARAFATE) 1 g tablet TAKE ONE TABLET BY MOUTH THREE TIMES A DAY WITH MEALS EPIGASTRIC PAIN TAKE FOR 7DAYS AND THEN AS NEEDED   traZODone (DESYREL) 50 MG tablet Take 50 mg by mouth at bedtime as needed for sleep.   triamcinolone cream (KENALOG) 0.1 % Apply 1 application topically 2 (two) times daily.   TRUE METRIX BLOOD GLUCOSE TEST test strip USE AS DIRECTED  TRUEplus Lancets 33G MISC USE PER INSTRUCTIONS.   venlafaxine (EFFEXOR) 75 MG tablet Take 75 mg by mouth 2 (two) times daily.   dextromethorphan-guaiFENesin (ROBITUSSIN-DM) 10-100 MG/5ML liquid Take 5 mLs by mouth every 6 (six) hours as needed for cough. (Patient not taking: Reported on 07/30/2022)   predniSONE (DELTASONE) 10 MG tablet 3 tabs by mouth per day for 3 days,2tabs per day for 3 days,1tab per day for 3 days (Patient not taking: Reported on 07/30/2022)   No facility-administered encounter medications on file as of 07/30/2022.    Allergies (verified) Prozac [fluoxetine hcl] and Dye fdc red [red dye]   History: Past Medical History:  Diagnosis Date   Agoraphobia    Anxiety    Coronary  artery disease    Diabetes mellitus without complication (HCC)    Essential Tremor    Hypercholesteremia    MDD (major depressive disorder)    Psychosis (HCC)    Past Surgical History:  Procedure Laterality Date   CARDIAC CATHETERIZATION     CHOLECYSTECTOMY     CORONARY STENT INTERVENTION N/A 09/24/2019   Procedure: CORONARY STENT INTERVENTION;  Surgeon: Varanasi, Jayadeep S, MD;  Location: [facility];  Service: Cardiovascular;  Laterality: N/A;   LEFT HEART CATH N/A 09/24/2019   Procedure: Left Heart Cath;  Surgeon: Varanasi, Jayadeep S, MD;  Location: [facility];  Service: Cardiovascular;  Laterality: N/A;   Family History  Problem Relation Age of Onset   Cancer Mother    Social History   Socioeconomic History   Marital status: Married    Spouse name: Angela   Number of children: Not on file   Years of education: 12   Highest education level: Some college, no degree  Occupational History   Occupation: Disabled  Tobacco Use   Smoking status: Every Day    Packs/day: 0.50    Years: 47.00    Total pack years: 23.50    Types: Cigarettes   Smokeless tobacco: Never   Tobacco comments:    Patient states he is not ready to quit smoking  Vaping Use   Vaping Use: Never used  Substance and Sexual Activity   Alcohol use: Not Currently   Drug use: No    Types: Cocaine    Comment: denies   Sexual activity: Not on file  Other Topics Concern   Not on file  Social History Narrative   Not on file   Social Determinants of Health   Financial Resource Strain: Low Risk  (07/30/2022)   Overall Financial Resource Strain (CARDIA)    Difficulty of Paying Living Expenses: Not hard at all  Food Insecurity: No Food Insecurity (07/30/2022)   Hunger Vital Sign    Worried About Running Out of Food in the Last Year: Never true    Ran Out of Food in the Last Year: Never true  Transportation Needs: No Transportation Needs (07/30/2022)   PRAPARE - Transportation    Lack of  Transportation (Medical): No    Lack of Transportation (Non-Medical): No  Physical Activity: Inactive (07/30/2022)   Exercise Vital Sign    Days of Exercise per Week: 0 days    Minutes of Exercise per Session: 0 min  Stress: Stress Concern Present (07/30/2022)   [employer] - Occupational Stress Questionnaire    Feeling of Stress : To some extent  Social Connections: Moderately Isolated (07/30/2022)   Social Connection and Isolation Panel [NHANES]    Frequency of Communication with Friends and   Family: More than three times a week    Frequency of Social Gatherings with Friends and Family: Once a week    Attends Religious Services: Never    Active Member of Clubs or Organizations: No    Attends Club or Organization Meetings: Never    Marital Status: Married    Tobacco Counseling Ready to quit: No Counseling given: Not Answered Tobacco comments: Patient states he is not ready to quit smoking   Clinical Intake:  Pre-visit preparation completed: Yes  Pain : 0-10 Pain Score: 4  Pain Type: Chronic pain Pain Location: Neck (neck and lower issues) Pain Descriptors / Indicators: Aching, Constant, Discomfort, Dull Pain Relieving Factors: medication  Pain Relieving Factors: medication  Nutritional Status: BMI of 19-24  Normal Nutritional Risks: None Diabetes: Yes CBG done?: No (phone visit) Did pt. bring in CBG monitor from home?: No (phone visit)  How often do you need to have someone help you when you read instructions, pamphlets, or other written materials from your doctor or pharmacy?: 1 - Never  Diabetic?Yes Nutrition Risk Assessment:  Has the patient had any N/V/D within the last 2 months?  No  Does the patient have any non-healing wounds?  No  Has the patient had any unintentional weight loss or weight gain?  No   Diabetes:  Is the patient diabetic?  Yes  If diabetic, was a CBG obtained today?  No , phone visit Did the patient bring in  their glucometer from home?  No , phone visit How often do you monitor your CBG's? 3 times daily.   Financial Strains and Diabetes Management:  Are you having any financial strains with the device, your supplies or your medication? No .  Does the patient want to be seen by Chronic Care Management for management of their diabetes?  No  Would the patient like to be referred to a Nutritionist or for Diabetic Management?  No   Diabetic Exams:  Diabetic Eye Exam: Overdue for diabetic eye exam. Pt has been advised about the importance in completing this exam. Patient advised to call and schedule an eye exam.  [facility] Diabetic Foot Exam: Overdue, Pt has been advised about the importance in completing this exam. Pt is scheduled for diabetic foot exam on deferred to PCP.   Interpreter Needed?: No  Information entered by ::   RN   Activities of Daily Living    07/30/2022    9:37 AM  In your present state of health, do you have any difficulty performing the following activities:  Hearing? 0  Vision? 0  Difficulty concentrating or making decisions? 0  Walking or climbing stairs? 0  Dressing or bathing? 0  Doing errands, shopping? 0  Preparing Food and eating ? N  Using the Toilet? N  In the past six months, have you accidently leaked urine? N  Do you have problems with loss of bowel control? N  Managing your Medications? N  Managing your Finances? N  Housekeeping or managing your Housekeeping? N    Patient Care Team: Sagardia, Miguel Jose, MD as PCP - General (Internal Medicine) Varanasi, Jayadeep S, MD as PCP - Cardiology (Cardiology)  Indicate any recent Medical Services you may have received from other than [facility] providers in the past year (date may be approximate).     Assessment:   This is a routine wellness examination for [place].  Hearing/Vision screen No results found.  Dietary issues and   exercise activities  discussed: Current Exercise Habits: The patient does not participate in regular exercise at present, Exercise limited by: orthopedic condition(s)   Goals Addressed             This Visit's Progress    Patient Stated       I want to begin to take school classes.      Depression Screen    07/30/2022   11:45 AM 06/15/2022    2:35 PM 03/15/2022    1:58 PM 12/02/2021    1:49 PM 03/17/2021   11:10 AM 04/15/2020   12:46 PM  PHQ 2/9 Scores  PHQ - 2 Score 2 0 2 0 0 0  PHQ- 9 Score 7  10       Fall Risk    06/15/2022    2:35 PM 03/15/2022    1:58 PM 12/02/2021    1:49 PM 12/02/2021    1:48 PM 03/17/2021   11:10 AM  Fall Risk   Falls in the past year? 0 0 1 0 1  Number falls in past yr: 0  0  0  Injury with Fall? 0  0  0  Risk for fall due to : No Fall Risks    No Fall Risks  Follow up Falls evaluation completed    Falls evaluation completed    FALL RISK PREVENTION PERTAINING TO THE HOME:  Any stairs in or around the home? Yes  If so, are there any without handrails? Yes  Home free of loose throw rugs in walkways, pet beds, electrical cords, etc? Yes  Adequate lighting in your home to reduce risk of falls? Yes   ASSISTIVE DEVICES UTILIZED TO PREVENT FALLS:  Life alert? No  Use of a cane, walker or w/c? Yes  Grab bars in the bathroom? Yes  Shower chair or bench in shower? No  Elevated toilet seat or a handicapped toilet? No   Cognitive Function:        07/30/2022    9:43 AM  6CIT Screen  What Year? 0 points  What month? 0 points  What time? 0 points  Count back from 20 0 points  Months in reverse 0 points  Repeat phrase 0 points  Total Score 0 points    Immunizations Immunization History  Administered Date(s) Administered   DTP 10/09/2010   Fluad Quad(high Dose 65+) 07/17/2020   Hepatitis B, adult 10/17/2000, 10/29/2002   Influenza-Unspecified 06/20/2001, 07/12/2001   Moderna Sars-Covid-2 Vaccination 10/02/2019, 11/02/2019, 11/30/2019, 08/21/2020    Pneumococcal Polysaccharide-23 09/20/2000   Pneumococcal-Unspecified 04/24/1997   Tdap 10/09/2010, 03/17/2021    TDAP status: Up to date  Flu Vaccine status: Due, Education has been provided regarding the importance of this vaccine. Advised may receive this vaccine at local pharmacy or Health Dept. Aware to provide a copy of the vaccination record if obtained from local pharmacy or Health Dept. Verbalized acceptance and understanding.  Pneumococcal vaccine status: Due, Education has been provided regarding the importance of this vaccine. Advised may receive this vaccine at local pharmacy or Health Dept. Aware to provide a copy of the vaccination record if obtained from local pharmacy or Health Dept. Verbalized acceptance and understanding.  Covid-19 vaccine status: Information provided on how to obtain vaccines.   Qualifies for Shingles Vaccine? Yes   Zostavax completed No   Shingrix Completed?: No.    Education has been provided regarding the importance of this vaccine. Patient has been advised to call insurance company to determine out of pocket expense   if they have not yet received this vaccine. Advised may also receive vaccine at local pharmacy or Health Dept. Verbalized acceptance and understanding.  Screening Tests Health Maintenance  Topic Date Due   FOOT EXAM  Never done   Zoster Vaccines- Shingrix (1 of 2) Never done   Lung Cancer Screening  11/16/2019   COVID-19 Vaccine (5 - Moderna series) 10/16/2020   Pneumonia Vaccine 101+ Years old (2 - PCV) 09/14/2022 (Originally 09/20/2001)   OPHTHALMOLOGY EXAM  09/14/2022 (Originally 08/26/1961)   COLONOSCOPY (Pts 45-69yr Insurance coverage will need to be confirmed)  12/14/2022 (Originally 08/25/1998)   INFLUENZA VACCINE  12/19/2022 (Originally 04/20/2022)   HEMOGLOBIN A1C  12/14/2022   Diabetic kidney evaluation - GFR measurement  03/16/2023   Diabetic kidney evaluation - Urine ACR  03/16/2023   Medicare Annual Wellness (AWV)  07/31/2023    TETANUS/TDAP  03/18/2031   Hepatitis C Screening  Completed   HPV VACCINES  Aged Out    Health Maintenance  Health Maintenance Due  Topic Date Due   FOOT EXAM  Never done   Zoster Vaccines- Shingrix (1 of 2) Never done   Lung Cancer Screening  11/16/2019   COVID-19 Vaccine (5 - Moderna series) 10/16/2020    Colorectal Screening: Patient states [facility]  Lung Cancer Screening: (Low Dose CT Chest recommended if Age 69-80years, 30 pack-year currently smoking OR have quit w/in 15years.) does qualify.   Lung Cancer Screening Referral: Deferred to PCP  Additional Screening:  Hepatitis C Screening: does qualify; Completed 10/31/15  Vision Screening: Recommended annual ophthalmology exams for early detection of glaucoma and other disorders of the eye. Is the patient up to date with their annual eye exam?  No  Who is the provider or what is the name of the office in which the patient attends annual eye exams? Patient [facility] If pt is not established with a provider, would they like to be referred to a provider to establish care? No , [place]   Dental Screening: Recommended annual dental exams for proper oral hygiene  Community Resource Referral / Chronic Care Management: CRR required this visit?  No   CCM required this visit?  No      Plan:     I have personally reviewed and noted the following in the patient's chart:   Medical and social history Use of alcohol, tobacco or illicit drugs  Current medications and supplements including opioid prescriptions. Patient is not currently taking opioid prescriptions. Functional ability and status Nutritional status Physical activity Advanced directives List of other physicians Hospitalizations, surgeries, and ER visits in previous 12 months Vitals Screenings to include cognitive, depression, and falls Referrals and  appointments  In addition, I have reviewed and discussed with patient certain preventive protocols, quality metrics, and best practice recommendations. A written personalized care plan for preventive services as well as general preventive health recommendations were provided to patient.     JMichiel Cowboy RN   07/30/2022   Nurse Notes:  Mr. MFlaum, Thank you for taking time to come for your Medicare Wellness Visit. I appreciate your ongoing commitment to your health goals. Please review the following plan we discussed and let me know if I can assist you in the future.   These are the goals we discussed:  Goals      Patient Stated     I want  to begin to take school classes.        This is a list of the screening recommended for you and due dates:  Health Maintenance  Topic Date Due   Complete foot exam   Never done   Zoster (Shingles) Vaccine (1 of 2) Never done   Screening for Lung Cancer  11/16/2019   COVID-19 Vaccine (5 - Moderna series) 10/16/2020   Pneumonia Vaccine (2 - PCV) 09/14/2022*   Eye exam for diabetics  09/14/2022*   Colon Cancer Screening  12/14/2022*   Flu Shot  12/19/2022*   Hemoglobin A1C  12/14/2022   Yearly kidney function blood test for diabetes  03/16/2023   Yearly kidney health urinalysis for diabetes  03/16/2023   Medicare Annual Wellness Visit  07/31/2023   Tetanus Vaccine  03/18/2031   Hepatitis C Screening: USPSTF Recommendation to screen - Ages 18-79 yo.  Completed   HPV Vaccine  Aged Out  *Topic was postponed. The date shown is not the original due date.         

## 2022-07-28 NOTE — Patient Instructions (Signed)
Health Maintenance, Male Adopting a healthy lifestyle and getting preventive care are important in promoting health and wellness. Ask your health care provider about: The right schedule for you to have regular tests and exams. Things you can do on your own to prevent diseases and keep yourself healthy. What should I know about diet, weight, and exercise? Eat a healthy diet  Eat a diet that includes plenty of vegetables, fruits, low-fat dairy products, and lean protein. Do not eat a lot of foods that are high in solid fats, added sugars, or sodium. Maintain a healthy weight Body mass index (BMI) is a measurement that can be used to identify possible weight problems. It estimates body fat based on height and weight. Your health care provider can help determine your BMI and help you achieve or maintain a healthy weight. Get regular exercise Get regular exercise. This is one of the most important things you can do for your health. Most adults should: Exercise for at least 150 minutes each week. The exercise should increase your heart rate and make you sweat (moderate-intensity exercise). Do strengthening exercises at least twice a week. This is in addition to the moderate-intensity exercise. Spend less time sitting. Even light physical activity can be beneficial. Watch cholesterol and blood lipids Have your blood tested for lipids and cholesterol at 69 years of age, then have this test every 5 years. You may need to have your cholesterol levels checked more often if: Your lipid or cholesterol levels are high. You are older than 69 years of age. You are at high risk for heart disease. What should I know about cancer screening? Many types of cancers can be detected early and may often be prevented. Depending on your health history and family history, you may need to have cancer screening at various ages. This may include screening for: Colorectal cancer. Prostate cancer. Skin cancer. Lung  cancer. What should I know about heart disease, diabetes, and high blood pressure? Blood pressure and heart disease High blood pressure causes heart disease and increases the risk of stroke. This is more likely to develop in people who have high blood pressure readings or are overweight. Talk with your health care provider about your target blood pressure readings. Have your blood pressure checked: Every 3-5 years if you are 18-39 years of age. Every year if you are 40 years old or older. If you are between the ages of 65 and 75 and are a current or former smoker, ask your health care provider if you should have a one-time screening for abdominal aortic aneurysm (AAA). Diabetes Have regular diabetes screenings. This checks your fasting blood sugar level. Have the screening done: Once every three years after age 45 if you are at a normal weight and have a low risk for diabetes. More often and at a younger age if you are overweight or have a high risk for diabetes. What should I know about preventing infection? Hepatitis B If you have a higher risk for hepatitis B, you should be screened for this virus. Talk with your health care provider to find out if you are at risk for hepatitis B infection. Hepatitis C Blood testing is recommended for: Everyone born from 1945 through 1965. Anyone with known risk factors for hepatitis C. Sexually transmitted infections (STIs) You should be screened each year for STIs, including gonorrhea and chlamydia, if: You are sexually active and are younger than 69 years of age. You are older than 69 years of age and your   health care provider tells you that you are at risk for this type of infection. Your sexual activity has changed since you were last screened, and you are at increased risk for chlamydia or gonorrhea. Ask your health care provider if you are at risk. Ask your health care provider about whether you are at high risk for HIV. Your health care provider  may recommend a prescription medicine to help prevent HIV infection. If you choose to take medicine to prevent HIV, you should first get tested for HIV. You should then be tested every 3 months for as long as you are taking the medicine. Follow these instructions at home: Alcohol use Do not drink alcohol if your health care provider tells you not to drink. If you drink alcohol: Limit how much you have to 0-2 drinks a day. Know how much alcohol is in your drink. In the [place] one drink equals one 12 oz bottle of beer (355 mL), one 5 oz glass of Martita Brumm (148 mL), or one 1 oz glass of hard liquor (44 mL). Lifestyle Do not use any products that contain nicotine or tobacco. These products include cigarettes, chewing tobacco, and vaping devices, such as e-cigarettes. If you need help quitting, ask your health care provider. Do not use street drugs. Do not share needles. Ask your health care provider for help if you need support or information about quitting drugs. General instructions Schedule regular health, dental, and eye exams. Stay current with your vaccines. Tell your health care provider if: You often feel depressed. You have ever been abused or do not feel safe at home. Summary Adopting a healthy lifestyle and getting preventive care are important in promoting health and wellness. Follow your health care provider's instructions about healthy diet, exercising, and getting tested or screened for diseases. Follow your health care provider's instructions on monitoring your cholesterol and blood pressure. This information is not intended to replace advice given to you by your health care provider. Make sure you discuss any questions you have with your health care provider. Document Revised: 01/26/2021 Document Reviewed: 01/26/2021 Elsevier Patient Education  2023 [employer]  

## 2022-07-29 ENCOUNTER — Ambulatory Visit (HOSPITAL_COMMUNITY)
Admission: RE | Admit: 2022-07-29 | Discharge: 2022-07-29 | Disposition: A | Discharge status: Home / Self Care | Payer: No Typology Code available for payment source | Source: Ambulatory Visit | Attending: Internal Medicine | Admitting: Internal Medicine

## 2022-07-29 DIAGNOSIS — Z8669 Personal history of other diseases of the nervous system and sense organs: Secondary | ICD-10-CM | POA: Insufficient documentation

## 2022-07-30 ENCOUNTER — Ambulatory Visit (INDEPENDENT_AMBULATORY_CARE_PROVIDER_SITE_OTHER): Payer: Medicare HMO | Admitting: *Deleted

## 2022-07-30 DIAGNOSIS — Z0000 Encounter for general adult medical examination without abnormal findings: Secondary | ICD-10-CM | POA: Diagnosis not present

## 2022-08-02 ENCOUNTER — Ambulatory Visit: Payer: Medicare HMO | Admitting: Internal Medicine

## 2022-08-10 ENCOUNTER — Ambulatory Visit (INDEPENDENT_AMBULATORY_CARE_PROVIDER_SITE_OTHER): Payer: Medicare HMO | Admitting: Emergency Medicine

## 2022-08-10 ENCOUNTER — Encounter: Payer: Self-pay | Admitting: Emergency Medicine

## 2022-08-10 VITALS — BP 124/68 | HR 49 | Temp 97.7°F | Ht 67.5 in | Wt 151.0 lb

## 2022-08-10 DIAGNOSIS — E1159 Type 2 diabetes mellitus with other circulatory complications: Secondary | ICD-10-CM

## 2022-08-10 DIAGNOSIS — I152 Hypertension secondary to endocrine disorders: Secondary | ICD-10-CM | POA: Diagnosis not present

## 2022-08-10 DIAGNOSIS — F32A Depression, unspecified: Secondary | ICD-10-CM | POA: Insufficient documentation

## 2022-08-10 DIAGNOSIS — E785 Hyperlipidemia, unspecified: Secondary | ICD-10-CM

## 2022-08-10 DIAGNOSIS — E079 Disorder of thyroid, unspecified: Secondary | ICD-10-CM

## 2022-08-10 DIAGNOSIS — Z720 Tobacco use: Secondary | ICD-10-CM | POA: Diagnosis not present

## 2022-08-10 DIAGNOSIS — Z7189 Other specified counseling: Secondary | ICD-10-CM | POA: Insufficient documentation

## 2022-08-10 DIAGNOSIS — E1169 Type 2 diabetes mellitus with other specified complication: Secondary | ICD-10-CM | POA: Diagnosis not present

## 2022-08-10 DIAGNOSIS — Z8619 Personal history of other infectious and parasitic diseases: Secondary | ICD-10-CM | POA: Insufficient documentation

## 2022-08-10 NOTE — Assessment & Plan Note (Signed)
Well-controlled hypertension. Continue amlodipine 5 mg, and lisinopril 40 mg daily. Also continue propranolol 10 mg 3 times a day BP Readings from Last 3 Encounters:  08/10/22 124/68  07/22/22 120/68  06/15/22 138/88  Well-controlled diabetes. Lab Results  Component Value Date   HGBA1C 6.8 (A) 06/15/2022  Continue metformin 500 mg twice a day Cardiovascular risk associated with hypertension and diabetes discussed. Diet and nutrition discussed.

## 2022-08-10 NOTE — Assessment & Plan Note (Signed)
Recent thyroid imaging study showed lesion as per patient. Needs endocrinology evaluation and possible biopsy. Referral placed today.

## 2022-08-10 NOTE — Assessment & Plan Note (Signed)
Much improved.  Asymptomatic. Normal physical findings.

## 2022-08-10 NOTE — Assessment & Plan Note (Signed)
Cancer and cardiovascular risks associated with smoking discussed.  Smoking cessation advice given. 

## 2022-08-10 NOTE — Patient Instructions (Signed)
Health Maintenance After Age 69 After age 69, you are at a higher risk for certain long-term diseases and infections as well as injuries from falls. Falls are a major cause of broken bones and head injuries in people who are older than age 69. Getting regular preventive care can help to keep you healthy and well. Preventive care includes getting regular testing and making lifestyle changes as recommended by your health care provider. Talk with your health care provider about: Which screenings and tests you should have. A screening is a test that checks for a disease when you have no symptoms. A diet and exercise plan that is right for you. What should I know about screenings and tests to prevent falls? Screening and testing are the best ways to find a health problem early. Early diagnosis and treatment give you the best chance of managing medical conditions that are common after age 69. Certain conditions and lifestyle choices may make you more likely to have a fall. Your health care provider may recommend: Regular vision checks. Poor vision and conditions such as cataracts can make you more likely to have a fall. If you wear glasses, make sure to get your prescription updated if your vision changes. Medicine review. Work with your health care provider to regularly review all of the medicines you are taking, including over-the-counter medicines. Ask your health care provider about any side effects that may make you more likely to have a fall. Tell your health care provider if any medicines that you take make you feel dizzy or sleepy. Strength and balance checks. Your health care provider may recommend certain tests to check your strength and balance while standing, walking, or changing positions. Foot health exam. Foot pain and numbness, as well as not wearing proper footwear, can make you more likely to have a fall. Screenings, including: Osteoporosis screening. Osteoporosis is a condition that causes  the bones to get weaker and break more easily. Blood pressure screening. Blood pressure changes and medicines to control blood pressure can make you feel dizzy. Depression screening. You may be more likely to have a fall if you have a fear of falling, feel depressed, or feel unable to do activities that you used to do. Alcohol use screening. Using too much alcohol can affect your balance and may make you more likely to have a fall. Follow these instructions at home: Lifestyle Do not drink alcohol if: Your health care provider tells you not to drink. If you drink alcohol: Limit how much you have to: 0-1 drink a day for women. 0-2 drinks a day for men. Know how much alcohol is in your drink. In the [place] one drink equals one 12 oz bottle of beer (355 mL), one 5 oz glass of wine (148 mL), or one 1 oz glass of hard liquor (44 mL). Do not use any products that contain nicotine or tobacco. These products include cigarettes, chewing tobacco, and vaping devices, such as e-cigarettes. If you need help quitting, ask your health care provider. Activity  Follow a regular exercise program to stay fit. This will help you maintain your balance. Ask your health care provider what types of exercise are appropriate for you. If you need a cane or walker, use it as recommended by your health care provider. Wear supportive shoes that have nonskid soles. Safety  Remove any tripping hazards, such as rugs, cords, and clutter. Install safety equipment such as grab bars in bathrooms and safety rails on stairs. Keep rooms and walkways   well-lit. General instructions Talk with your health care provider about your risks for falling. Tell your health care provider if: You fall. Be sure to tell your health care provider about all falls, even ones that seem minor. You feel dizzy, tiredness (fatigue), or off-balance. Take over-the-counter and prescription medicines only as told by your health care provider. These include  supplements. Eat a healthy diet and maintain a healthy weight. A healthy diet includes low-fat dairy products, low-fat (lean) meats, and fiber from whole grains, beans, and lots of fruits and vegetables. Stay current with your vaccines. Schedule regular health, dental, and eye exams. Summary Having a healthy lifestyle and getting preventive care can help to protect your health and wellness after age 69. Screening and testing are the best way to find a health problem early and help you avoid having a fall. Early diagnosis and treatment give you the best chance for managing medical conditions that are more common for people who are older than age 69. Falls are a major cause of broken bones and head injuries in people who are older than age 69. Take precautions to prevent a fall at home. Work with your health care provider to learn what changes you can make to improve your health and wellness and to prevent falls. This information is not intended to replace advice given to you by your health care provider. Make sure you discuss any questions you have with your health care provider. Document Revised: 01/26/2021 Document Reviewed: 01/26/2021 Elsevier Patient Education  2023 Elsevier Inc.  

## 2022-08-10 NOTE — Progress Notes (Signed)
Troy Little 71 y.o.   Chief Complaint  Patient presents with   lymphnode    Pain and swelling on left side of throat    HISTORY OF PRESENT ILLNESS: This is a 71 y.o. male here for follow-up of recent throat infection for which he took Augmentin Feeling much better.  Lymph nodes no longer enlarged.  Throat feeling better. [facility].  Recent cervical spine MRI shows spinal stenosis at different levels mild to moderate degree. States he also had a thyroid imaging test which showed a "mass in my thyroid".  Requesting referral. Chronic smoker. No other complaints or medical concerns today.  HPI   Prior to Admission medications   Medication Sig Start Date End Date Taking? Authorizing Provider  Alcohol Swabs (ALCOHOL PREP PAD) 70 % PADS USE PAD TO AFFECTED AREA AS DIRECTED 06/16/22  Yes [provider]  Alcohol Swabs (DROPSAFE ALCOHOL PREP) 70 % PADS APPLY TO AFFECTED AREA AS DIRECTED 03/15/22  Yes Lezette Kitts, Ines Bloomer, MD  amLODipine (NORVASC) 5 MG tablet Take 1 tablet by mouth daily. 07/08/22  Yes [provider]  amoxicillin-clavulanate (AUGMENTIN) 875-125 MG tablet Take 1 tablet by mouth 2 (two) times daily. 07/22/22  Yes Biagio Borg, MD  aspirin EC 81 MG tablet Take 81 mg by mouth daily.   Yes [provider]  blood glucose meter kit and supplies Dispense based on patient and insurance preference. Use to test in the morning, noon, and bedtime as directed. (FOR ICD-10 E10.9, E11.9). 10/30/20  Yes Marrian Salvage, FNP  cetirizine (ZYRTEC) 10 MG tablet TAKE 1 TABLET EVERY DAY 02/23/22  Yes Horald Pollen, MD  clopidogrel (PLAVIX) 75 MG tablet Take 1 tablet by mouth once daily 08/22/20  Yes Sherren Mocha, MD  dextromethorphan-guaiFENesin (ROBITUSSIN-DM) 10-100 MG/5ML liquid Take 5 mLs by mouth every 6 (six) hours as needed for cough. 07/02/21  Yes Biagio Borg, MD  Docusate Sodium (DSS) 100 MG CAPS TAKE ONE CAPSULE BY MOUTH TWICE A DAY AS NEEDED  06/16/22  Yes [provider]  famotidine (PEPCID) 20 MG tablet TAKE 1 TABLET TWICE DAILY 07/22/21  Yes Marrian Salvage, FNP  Glucose 15 GM/32ML GEL TAKE 1 TUBE BY MOUTH AS DIRECTED BY YOUR PROVIDER AS NEEDED LOW GLUCOSE 06/16/22  Yes [provider]  isosorbide mononitrate (IMDUR) 30 MG 24 hr tablet TAKE ONE-HALF TABLET BY MOUTH ONCE A DAY 06/16/22  Yes [provider]  lidocaine (LIDODERM) 5 % APPLY 1 PATCH TO SKIN ONCE DAILY (APPLY FOR 12 HOURS, THEN REMOVE FOR 12 HOURS) 06/16/22  Yes [provider]  lisinopril (ZESTRIL) 40 MG tablet Take 1 tablet (40 mg total) by mouth daily. Overdue for Annual appt must see provider for future refills 07/14/20  Yes Marrian Salvage, FNP  metFORMIN (GLUCOPHAGE) 500 MG tablet Take 1 tablet (500 mg total) by mouth daily with breakfast. Per patient, he takes 1/4 tablet per day "when he needs it" 11/10/18  Yes Valere Dross, Marvis Repress, FNP  Omega-3 Fatty Acids (FISH OIL) 500 MG CAPS Take 500 mg by mouth 3 (three) times daily.    Yes [provider]  pantoprazole (PROTONIX) 40 MG tablet Take 40 mg by mouth 2 (two) times daily. 07/14/22  Yes [provider]  predniSONE (DELTASONE) 10 MG tablet 3 tabs by mouth per day for 3 days,2tabs per day for 3 days,1tab per day for 3 days 07/01/21  Yes Biagio Borg, MD  primidone (MYSOLINE) 50 MG tablet Take 1  tablet (50 mg total) by mouth 2 (two) times daily. Per patient, takes  1 1/2 tablet in the am and 1 tablet in the afternoon 11/10/18  Yes Valere Dross, Marvis Repress, FNP  propranolol (INDERAL) 10 MG tablet Patient takes 1 1/2 tablet in the am, 1 in the afternoon, 1 in the evening 11/10/18  Yes Marrian Salvage, FNP  simvastatin (ZOCOR) 20 MG tablet Take 20 mg by mouth at bedtime.    Yes [provider]  sucralfate (CARAFATE) 1 g tablet TAKE ONE TABLET BY MOUTH THREE TIMES A DAY WITH MEALS EPIGASTRIC PAIN TAKE FOR 7DAYS AND THEN AS NEEDED 07/14/22  Yes  [provider]  terbinafine (LAMISIL) 1 % cream Apply 1 Application topically 2 (two) times daily. 07/08/22  Yes [provider]  traMADol (ULTRAM) 50 MG tablet Take 50 mg by mouth 4 (four) times daily. 07/13/22  Yes [provider]  traZODone (DESYREL) 50 MG tablet Take 50 mg by mouth at bedtime as needed for sleep.   Yes [provider]  triamcinolone cream (KENALOG) 0.1 % Apply 1 application topically 2 (two) times daily. 04/25/20  Yes Hoyt Koch, MD  TRUE METRIX BLOOD GLUCOSE TEST test strip USE AS DIRECTED 05/27/22  Yes Tuvia Woodrick, Ines Bloomer, MD  TRUEplus Lancets 33G MISC USE PER INSTRUCTIONS. 05/12/22  Yes Horald Pollen, MD  venlafaxine (EFFEXOR) 75 MG tablet Take 75 mg by mouth 2 (two) times daily.   Yes [provider]    Allergies  Allergen Reactions   Prozac [Fluoxetine Hcl] Other (See Comments)    agitation   Dye Fdc Red [Red Dye] Rash    Patient Active Problem List   Diagnosis Date Noted   Other specified counseling 08/10/2022   Depression, unspecified 08/10/2022   Hyperglycemia 07/22/2022   Left sided sciatica 03/15/2022   Current smoker 03/15/2022   Angina pectoris ([place])    Hypertension associated with diabetes ([place]) 01/75/1025   Diastolic dysfunction 85/27/7824   Hyperlipidemia 11/01/2015   Chronic neck pain 11/01/2015   TIA (transient ischemic attack) 10/31/2015   Dyslipidemia associated with type 2 diabetes mellitus ([place]) 10/31/2015   Anxiety and depression 10/31/2015   Tobacco use 10/31/2015   Essential tremor 10/31/2015    Past Medical History:  Diagnosis Date   Agoraphobia    Anxiety    Coronary artery disease    Diabetes mellitus without complication ([place])    Essential Tremor    Hypercholesteremia    MDD (major depressive disorder)    Psychosis ([place])     Past Surgical History:  Procedure Laterality Date   CARDIAC CATHETERIZATION     CHOLECYSTECTOMY     CORONARY STENT INTERVENTION N/A  09/24/2019   Procedure: CORONARY STENT INTERVENTION;  Surgeon: Jettie Booze, MD;  Location: [facility];  Service: Cardiovascular;  Laterality: N/A;   LEFT HEART CATH N/A 09/24/2019   Procedure: Left Heart Cath;  Surgeon: Jettie Booze, MD;  Location: [facility];  Service: Cardiovascular;  Laterality: N/A;    Social History   Socioeconomic History   Marital status: Married    Spouse name: Levada Dy   Number of children: Not on file   Years of education: 12   Highest education level: Some college, no degree  Occupational History   Occupation: Disabled  Tobacco Use   Smoking status: Every Day    Packs/day: 0.50    Years: 47.00    Total pack years: 23.50    Types: Cigarettes  Smokeless tobacco: Never   Tobacco comments:    Patient states he is not ready to quit smoking  Vaping Use   Vaping Use: Never used  Substance and Sexual Activity   Alcohol use: Not Currently   Drug use: No    Types: Cocaine    Comment: denies   Sexual activity: Not on file  Other Topics Concern   Not on file  Social History Narrative   Not on file   Social Determinants of Health   Financial Resource Strain: Low Risk  (07/30/2022)   Overall Financial Resource Strain (CARDIA)    Difficulty of Paying Living Expenses: Not hard at all  Food Insecurity: No Food Insecurity (07/30/2022)   Hunger Vital Sign    Worried About Running Out of Food in the Last Year: Never true    Ran Out of Food in the Last Year: Never true  Transportation Needs: No Transportation Needs (07/30/2022)   PRAPARE - Hydrologist (Medical): No    Lack of Transportation (Non-Medical): No  Physical Activity: Inactive (07/30/2022)   Exercise Vital Sign    Days of Exercise per Week: 0 days    Minutes of Exercise per Session: 0 min  Stress: Stress Concern Present (07/30/2022)   [place]    Feeling of Stress : To  some extent  Social Connections: Moderately Isolated (07/30/2022)   Social Connection and Isolation Panel [NHANES]    Frequency of Communication with Friends and Family: More than three times a week    Frequency of Social Gatherings with Friends and Family: Once a week    Attends Religious Services: Never    Marine scientist or Organizations: No    Attends Archivist Meetings: Never    Marital Status: Married  Human resources officer Violence: Not At Risk (07/30/2022)   Humiliation, Afraid, Rape, and Kick questionnaire    Fear of Current or Ex-Partner: No    Emotionally Abused: No    Physically Abused: No    Sexually Abused: No    Family History  Problem Relation Age of Onset   Cancer Mother      Review of Systems  Constitutional: Negative.  Negative for chills and fever.  HENT: Negative.  Negative for congestion and sore throat.   Respiratory: Negative.  Negative for cough and shortness of breath.   Cardiovascular: Negative.  Negative for chest pain and palpitations.  Gastrointestinal:  Negative for abdominal pain, diarrhea, nausea and vomiting.  Genitourinary: Negative.  Negative for dysuria and hematuria.  Skin: Negative.   Neurological: Negative.  Negative for dizziness and headaches.  All other systems reviewed and are negative.   Today's Vitals   08/10/22 0949  BP: 124/68  Pulse: (!) 49  Temp: 97.7 F (36.5 C)  TempSrc: Oral  SpO2: 99%  Weight: 151 lb (68.5 kg)  Height: 5' 7.5" (1.715 m)   Body mass index is 23.3 kg/m.  Physical Exam Vitals reviewed.  Constitutional:      Appearance: Normal appearance.  HENT:     Head: Normocephalic.     Mouth/Throat:     Mouth: Mucous membranes are moist.  Eyes:     Extraocular Movements: Extraocular movements intact.     Conjunctiva/sclera: Conjunctivae normal.     Pupils: Pupils are equal, round, and reactive to light.  Cardiovascular:     Rate and Rhythm: Normal rate and regular rhythm.     Pulses:  Normal  pulses.     Heart sounds: Normal heart sounds.  Pulmonary:     Effort: Pulmonary effort is normal.     Breath sounds: Normal breath sounds.  Musculoskeletal:     Cervical back: No tenderness.  Lymphadenopathy:     Cervical: No cervical adenopathy.  Skin:    General: Skin is warm and dry.  Neurological:     General: No focal deficit present.     Mental Status: He is alert and oriented to person, place, and time.  Psychiatric:        Mood and Affect: Mood normal.        Behavior: Behavior normal.      ASSESSMENT & PLAN: A total of 44 minutes was spent with the patient and counseling/coordination of care regarding preparing for this visit, review of most recent office visit notes, review of multiple chronic medical conditions and their management, review of all medications, review of health maintenance items, education on nutrition, smoking cessation advised, cardiovascular risks associated with hypertension and smoking, prognosis, documentation, and need for follow-up.  Problem List Items Addressed This Visit       Cardiovascular and Mediastinum   Hypertension associated with diabetes ([place]) - Primary    Well-controlled hypertension. Continue amlodipine 5 mg, and lisinopril 40 mg daily. Also continue propranolol 10 mg 3 times a day BP Readings from Last 3 Encounters:  08/10/22 124/68  07/22/22 120/68  06/15/22 138/88  Well-controlled diabetes. Lab Results  Component Value Date   HGBA1C 6.8 (A) 06/15/2022  Continue metformin 500 mg twice a day Cardiovascular risk associated with hypertension and diabetes discussed. Diet and nutrition discussed.        Relevant Medications   Glucose 15 GM/32ML GEL     Endocrine   Dyslipidemia associated with type 2 diabetes mellitus (HCC)    Stable.  Diet and nutrition discussed.  Continue simvastatin 20 mg daily.      Relevant Medications   Glucose 15 GM/32ML GEL   Thyroid lesion    Recent thyroid imaging study showed  lesion as per patient. Needs endocrinology evaluation and possible biopsy. Referral placed today.      Relevant Orders   Ambulatory referral to Endocrinology     Other   Tobacco use    Cancer and cardiovascular risks associated with smoking discussed. Smoking cessation advice given.      History of recent acute infection    Much improved.  Asymptomatic. Normal physical findings.      Patient Instructions  Health Maintenance After Age 88 After age 6, you are at a higher risk for certain long-term diseases and infections as well as injuries from falls. Falls are a major cause of broken bones and head injuries in people who are older than age 12. Getting regular preventive care can help to keep you healthy and well. Preventive care includes getting regular testing and making lifestyle changes as recommended by your health care provider. Talk with your health care provider about: Which screenings and tests you should have. A screening is a test that checks for a disease when you have no symptoms. A diet and exercise plan that is right for you. What should I know about screenings and tests to prevent falls? Screening and testing are the best ways to find a health problem early. Early diagnosis and treatment give you the best chance of managing medical conditions that are common after age 81. Certain conditions and lifestyle choices may make you more likely to have a fall.  Your health care provider may recommend: Regular vision checks. Poor vision and conditions such as cataracts can make you more likely to have a fall. If you wear glasses, make sure to get your prescription updated if your vision changes. Medicine review. Work with your health care provider to regularly review all of the medicines you are taking, including over-the-counter medicines. Ask your health care provider about any side effects that may make you more likely to have a fall. Tell your health care provider if any  medicines that you take make you feel dizzy or sleepy. Strength and balance checks. Your health care provider may recommend certain tests to check your strength and balance while standing, walking, or changing positions. Foot health exam. Foot pain and numbness, as well as not wearing proper footwear, can make you more likely to have a fall. Screenings, including: Osteoporosis screening. Osteoporosis is a condition that causes the bones to get weaker and break more easily. Blood pressure screening. Blood pressure changes and medicines to control blood pressure can make you feel dizzy. Depression screening. You may be more likely to have a fall if you have a fear of falling, feel depressed, or feel unable to do activities that you used to do. Alcohol use screening. Using too much alcohol can affect your balance and may make you more likely to have a fall. Follow these instructions at home: Lifestyle Do not drink alcohol if: Your health care provider tells you not to drink. If you drink alcohol: Limit how much you have to: 0-1 drink a day for women. 0-2 drinks a day for men. Know how much alcohol is in your drink. In the [place] one drink equals one 12 oz bottle of beer (355 mL), one 5 oz glass of wine (148 mL), or one 1 oz glass of hard liquor (44 mL). Do not use any products that contain nicotine or tobacco. These products include cigarettes, chewing tobacco, and vaping devices, such as e-cigarettes. If you need help quitting, ask your health care provider. Activity  Follow a regular exercise program to stay fit. This will help you maintain your balance. Ask your health care provider what types of exercise are appropriate for you. If you need a cane or walker, use it as recommended by your health care provider. Wear supportive shoes that have nonskid soles. Safety  Remove any tripping hazards, such as rugs, cords, and clutter. Install safety equipment such as grab bars in bathrooms and  safety rails on stairs. Keep rooms and walkways well-lit. General instructions Talk with your health care provider about your risks for falling. Tell your health care provider if: You fall. Be sure to tell your health care provider about all falls, even ones that seem minor. You feel dizzy, tiredness (fatigue), or off-balance. Take over-the-counter and prescription medicines only as told by your health care provider. These include supplements. Eat a healthy diet and maintain a healthy weight. A healthy diet includes low-fat dairy products, low-fat (lean) meats, and fiber from whole grains, beans, and lots of fruits and vegetables. Stay current with your vaccines. Schedule regular health, dental, and eye exams. Summary Having a healthy lifestyle and getting preventive care can help to protect your health and wellness after age 67. Screening and testing are the best way to find a health problem early and help you avoid having a fall. Early diagnosis and treatment give you the best chance for managing medical conditions that are more common for people who are older than age 66.  Falls are a major cause of broken bones and head injuries in people who are older than age 49. Take precautions to prevent a fall at home. Work with your health care provider to learn what changes you can make to improve your health and wellness and to prevent falls. This information is not intended to replace advice given to you by your health care provider. Make sure you discuss any questions you have with your health care provider. Document Revised: 01/26/2021 Document Reviewed: 01/26/2021 Elsevier Patient Education  [place], MD [facility]

## 2022-08-10 NOTE — Assessment & Plan Note (Signed)
Stable.  Diet and nutrition discussed.  Continue simvastatin 20 mg daily.

## 2022-08-30 ENCOUNTER — Other Ambulatory Visit (HOSPITAL_COMMUNITY): Payer: Self-pay | Admitting: Internal Medicine

## 2022-08-30 DIAGNOSIS — E041 Nontoxic single thyroid nodule: Secondary | ICD-10-CM

## 2022-09-06 ENCOUNTER — Ambulatory Visit (HOSPITAL_COMMUNITY)
Admission: RE | Admit: 2022-09-06 | Discharge: 2022-09-06 | Disposition: A | Discharge status: Home / Self Care | Payer: No Typology Code available for payment source | Source: Ambulatory Visit | Attending: Internal Medicine | Admitting: Internal Medicine

## 2022-09-06 DIAGNOSIS — E041 Nontoxic single thyroid nodule: Secondary | ICD-10-CM | POA: Insufficient documentation

## 2022-09-07 ENCOUNTER — Ambulatory Visit (INDEPENDENT_AMBULATORY_CARE_PROVIDER_SITE_OTHER): Payer: No Typology Code available for payment source | Admitting: Emergency Medicine

## 2022-09-07 ENCOUNTER — Encounter: Payer: Self-pay | Admitting: Emergency Medicine

## 2022-09-07 VITALS — BP 130/70 | HR 48 | Temp 97.7°F | Ht 67.5 in | Wt 147.2 lb

## 2022-09-07 DIAGNOSIS — L989 Disorder of the skin and subcutaneous tissue, unspecified: Secondary | ICD-10-CM | POA: Insufficient documentation

## 2022-09-07 DIAGNOSIS — H1013 Acute atopic conjunctivitis, bilateral: Secondary | ICD-10-CM | POA: Insufficient documentation

## 2022-09-07 MED ORDER — AZELASTINE HCL 0.05 % OP SOLN: 1.0000 [drp] | Freq: Two times a day (BID) | OPHTHALMIC | 3 refills | Status: DC

## 2022-09-07 NOTE — Patient Instructions (Addendum)
Must follow-up with ophthalmologist as discussed. Also follow-up with dermatologist  Allergic Conjunctivitis, Adult  Allergic conjunctivitis is inflammation of the clear membrane that covers the white part of your eye and the inner surface of your eyelid. This area is called the conjunctiva. This condition can make your eye red or pink. It can also make your eye feel itchy. This condition is not contagious. This means that it cannot be spread from one person to another person. What are the causes? This condition is caused by allergens. These are things that can cause an allergic reaction in some people. Common allergens include: Outdoor allergens, such as: Pollen, including pollen from grass and weeds. Mold. Car fumes. Indoor allergens, such as: Dust. Smoke. Mold. Proteins in a pet's pee (urine), saliva, or dander. Proteins that build up in contact lenses. What increases the risk? You are more likely to develop this condition if you have a family history of these things: Allergies. Conditions that you get because of allergens, such as asthma or inflammation of the skin (eczema). What are the signs or symptoms? Symptoms of this condition include eyes that are: Itchy. Red. Watery. Puffy. Your eyes may also: Sting or burn. Have clear fluid draining from them. Have thick mucus coming from them. This happens in severe cases. How is this treated? Treatment for this condition may include: Using cold, wet cloths (cold compresses) to soothe itching and swelling. Washing the face, hair, and clothing to remove allergens. Using eye drops. These may include: Eye drops that block allergies. Eye drops that reduce swelling and irritation. Steroid eye drops if other treatments have not worked. Oral antihistamine medicines. These medicines lessen your allergies. You may need these if eye drops do not help or are difficult to use. Air purifier at home and work. Wrap around sunglasses. This  may help to block allergens from reaching the eye. Not wearing contact lenses, if the doctor has found that contact lenses caused your symptoms. Use daily wear disposal contact lenses instead. Follow these instructions at home: Eye care Place a cool, clean washcloth on your eye for 10-20 minutes. Do this 3-4 times a day. Do not touch or rub your eyes. Do not wear contact lenses until the inflammation is gone. Wear glasses instead. Do not wear eye makeup until the inflammation is gone. General instructions Try not to be around things that you are allergic to. Take or apply over-the-counter and prescription medicines only as told by your doctor. These include any eye drops. Drink enough fluid to keep your pee pale yellow. Keep all follow-up visits. Contact a doctor if: Your symptoms get worse. Your symptoms do not get better with treatment. You have mild eye pain. You are sensitive to light. You have spots or blisters on your eyes. You have pus coming from your eye. You have a fever. Get help right away if: You have redness, swelling, or other symptoms in only one eye. You cannot see well. You have other vision changes. You have very bad eye pain. Summary Allergic conjunctivitis is caused by allergens. It can make your eye red or pink, and it can make your eye feel itchy. This condition cannot be spread from one person to another person. Avoid things that you are allergic to. Take or apply over-the-counter and prescription medicines only as told by your doctor. These include any eye drops. Contact your doctor if your symptoms get worse or they do not get better with treatment. This information is not intended to replace advice  given to you by your health care provider. Make sure you discuss any questions you have with your health care provider. Document Revised: 11/13/2021 Document Reviewed: 11/13/2021 Elsevier Patient Education  [place].

## 2022-09-07 NOTE — Progress Notes (Signed)
Troy Little 71 y.o.   Chief Complaint  Patient presents with   Acute Visit    Irritation in both eyes, watery, itchy     HISTORY OF PRESENT ILLNESS: Acute problem visit today. This is a 71 y.o. male complaining of constant feeling of irritation in both eyes for the past several weeks.  Similar to episode he had last March when I saw him and diagnosed with allergic conjunctivitis.  Was referred to ophthalmologist.  States he went to Dr. Velvet Bathe office but the wait was too long.  Advised to call that office and make another appointment. Also complaining of itchy right-sided facial lesions.  Needs dermatology evaluation  HPI   Prior to Admission medications   Medication Sig Start Date End Date Taking? Authorizing Provider  Alcohol Swabs (ALCOHOL PREP PAD) 70 % PADS USE PAD TO AFFECTED AREA AS DIRECTED 06/16/22  Yes [provider]  Alcohol Swabs (DROPSAFE ALCOHOL PREP) 70 % PADS APPLY TO AFFECTED AREA AS DIRECTED 03/15/22  Yes Yochanan Eddleman, Ines Bloomer, MD  amLODipine (NORVASC) 5 MG tablet Take 1 tablet by mouth daily. 07/08/22  Yes [provider]  aspirin EC 81 MG tablet Take 81 mg by mouth daily.   Yes [provider]  blood glucose meter kit and supplies Dispense based on patient and insurance preference. Use to test in the morning, noon, and bedtime as directed. (FOR ICD-10 E10.9, E11.9). 10/30/20  Yes Marrian Salvage, FNP  cetirizine (ZYRTEC) 10 MG tablet TAKE 1 TABLET EVERY DAY 02/23/22  Yes Lateka Rady, Ines Bloomer, MD  Docusate Sodium (DSS) 100 MG CAPS TAKE ONE CAPSULE BY MOUTH TWICE A DAY AS NEEDED 06/16/22  Yes [provider]  famotidine (PEPCID) 20 MG tablet TAKE 1 TABLET TWICE DAILY 07/22/21  Yes Marrian Salvage, FNP  Glucose 15 GM/32ML GEL TAKE 1 TUBE BY MOUTH AS DIRECTED BY YOUR PROVIDER AS NEEDED LOW GLUCOSE 06/16/22  Yes [provider]  isosorbide mononitrate (IMDUR) 30 MG 24 hr tablet TAKE ONE-HALF TABLET BY MOUTH ONCE A  DAY 06/16/22  Yes [provider]  lidocaine (LIDODERM) 5 % APPLY 1 PATCH TO SKIN ONCE DAILY (APPLY FOR 12 HOURS, THEN REMOVE FOR 12 HOURS) 06/16/22  Yes [provider]  lisinopril (ZESTRIL) 40 MG tablet Take 1 tablet (40 mg total) by mouth daily. Overdue for Annual appt must see provider for future refills 07/14/20  Yes Marrian Salvage, FNP  metFORMIN (GLUCOPHAGE) 500 MG tablet Take 1 tablet (500 mg total) by mouth daily with breakfast. Per patient, he takes 1/4 tablet per day "when he needs it" 11/10/18  Yes Valere Dross, Marvis Repress, FNP  Omega-3 Fatty Acids (FISH OIL) 500 MG CAPS Take 500 mg by mouth 3 (three) times daily.    Yes [provider]  pantoprazole (PROTONIX) 40 MG tablet Take 40 mg by mouth 2 (two) times daily. 07/14/22  Yes [provider]  predniSONE (DELTASONE) 10 MG tablet 3 tabs by mouth per day for 3 days,2tabs per day for 3 days,1tab per day for 3 days 07/01/21  Yes Biagio Borg, MD  primidone (MYSOLINE) 50 MG tablet Take 1 tablet (50 mg total) by mouth 2 (two) times daily. Per patient, takes  1 1/2 tablet in the am and 1 tablet in the afternoon 11/10/18  Yes Valere Dross, Marvis Repress, FNP  propranolol (INDERAL) 10 MG tablet Patient takes 1 1/2 tablet in the am, 1 in the afternoon, 1 in the evening 11/10/18  Yes Marrian Salvage,  FNP  simvastatin (ZOCOR) 20 MG tablet Take 20 mg by mouth at bedtime.    Yes [provider]  sucralfate (CARAFATE) 1 g tablet TAKE ONE TABLET BY MOUTH THREE TIMES A DAY WITH MEALS EPIGASTRIC PAIN TAKE FOR 7DAYS AND THEN AS NEEDED 07/14/22  Yes [provider]  terbinafine (LAMISIL) 1 % cream Apply 1 Application topically 2 (two) times daily. 07/08/22  Yes [provider]  traMADol (ULTRAM) 50 MG tablet Take 50 mg by mouth 4 (four) times daily. 07/13/22  Yes [provider]  traZODone (DESYREL) 50 MG tablet Take 50 mg by mouth at bedtime as needed for sleep.   Yes [provider]  triamcinolone cream (KENALOG) 0.1 % Apply 1 application topically 2 (two) times daily. 04/25/20  Yes Hoyt Koch, MD  TRUE METRIX BLOOD GLUCOSE TEST test strip USE AS DIRECTED 05/27/22  Yes Duff Pozzi, Ines Bloomer, MD  TRUEplus Lancets 33G MISC USE PER INSTRUCTIONS. 05/12/22  Yes Horald Pollen, MD  venlafaxine (EFFEXOR) 75 MG tablet Take 75 mg by mouth 2 (two) times daily.   Yes [provider]    Allergies  Allergen Reactions   Prozac [Fluoxetine Hcl] Other (See Comments)    agitation   Dye Fdc Red [Red Dye] Rash    Patient Active Problem List   Diagnosis Date Noted   Other specified counseling 08/10/2022   Depression, unspecified 08/10/2022   History of recent acute infection 08/10/2022   Thyroid lesion 08/10/2022   Hyperglycemia 07/22/2022   Left sided sciatica 03/15/2022   Current smoker 03/15/2022   Angina pectoris ([place])    Hypertension associated with diabetes ([place]) 32/35/5732   Diastolic dysfunction 20/25/4270   Hyperlipidemia 11/01/2015   Chronic neck pain 11/01/2015   TIA (transient ischemic attack) 10/31/2015   Dyslipidemia associated with type 2 diabetes mellitus ([place]) 10/31/2015   Anxiety and depression 10/31/2015   Tobacco use 10/31/2015   Essential tremor 10/31/2015    Past Medical History:  Diagnosis Date   Agoraphobia    Anxiety    Coronary artery disease    Diabetes mellitus without complication ([place])    Essential Tremor    Hypercholesteremia    MDD (major depressive disorder)    Psychosis ([place])     Past Surgical History:  Procedure Laterality Date   CARDIAC CATHETERIZATION     CHOLECYSTECTOMY     CORONARY STENT INTERVENTION N/A 09/24/2019   Procedure: CORONARY STENT INTERVENTION;  Surgeon: Jettie Booze, MD;  Location: [facility];  Service: Cardiovascular;  Laterality: N/A;   LEFT HEART CATH N/A 09/24/2019   Procedure: Left Heart Cath;  Surgeon: Jettie Booze, MD;  Location: [facility];  Service: Cardiovascular;  Laterality: N/A;    Social History   Socioeconomic History   Marital status: Married    Spouse name: Levada Dy   Number of children: Not on file   Years of education: 12   Highest education level: Some college, no degree  Occupational History   Occupation: Disabled  Tobacco Use   Smoking status: Every Day    Packs/day: 0.50    Years: 47.00    Total pack years: 23.50    Types: Cigarettes   Smokeless tobacco: Never   Tobacco comments:    Patient states he is not ready to quit smoking  Vaping Use   Vaping Use: Never used  Substance and Sexual Activity   Alcohol use: Not Currently   Drug use: No  Types: Cocaine    Comment: denies   Sexual activity: Not on file  Other Topics Concern   Not on file  Social History Narrative   Not on file   Social Determinants of Health   Financial Resource Strain: Low Risk  (07/30/2022)   Overall Financial Resource Strain (CARDIA)    Difficulty of Paying Living Expenses: Not hard at all  Food Insecurity: No Food Insecurity (07/30/2022)   Hunger Vital Sign    Worried About Running Out of Food in the Last Year: Never true    Ran Out of Food in the Last Year: Never true  Transportation Needs: No Transportation Needs (07/30/2022)   PRAPARE - Hydrologist (Medical): No    Lack of Transportation (Non-Medical): No  Physical Activity: Inactive (07/30/2022)   Exercise Vital Sign    Days of Exercise per Week: 0 days    Minutes of Exercise per Session: 0 min  Stress: Stress Concern Present (07/30/2022)   [place]    Feeling of Stress : To some extent  Social Connections: Moderately Isolated (07/30/2022)   Social Connection and Isolation Panel [NHANES]    Frequency of Communication with Friends and Family: More than three times a week    Frequency of Social Gatherings with Friends and Family: Once a week    Attends  Religious Services: Never    Marine scientist or Organizations: No    Attends Archivist Meetings: Never    Marital Status: Married  Human resources officer Violence: Not At Risk (07/30/2022)   Humiliation, Afraid, Rape, and Kick questionnaire    Fear of Current or Ex-Partner: No    Emotionally Abused: No    Physically Abused: No    Sexually Abused: No    Family History  Problem Relation Age of Onset   Cancer Mother      Review of Systems  Constitutional: Negative.  Negative for chills and fever.  HENT: Negative.  Negative for congestion and sore throat.   Eyes:  Positive for redness. Negative for blurred vision.  Respiratory: Negative.  Negative for cough and shortness of breath.   Cardiovascular: Negative.  Negative for chest pain and palpitations.  Gastrointestinal:  Negative for abdominal pain, nausea and vomiting.  Genitourinary: Negative.  Negative for dysuria and hematuria.  Skin: Negative.  Negative for rash.  Neurological: Negative.  Negative for dizziness and headaches.  All other systems reviewed and are negative.  Today's Vitals   09/07/22 0946  BP: 130/70  Pulse: (!) 48  Temp: 97.7 F (36.5 C)  TempSrc: Oral  SpO2: 97%  Weight: 147 lb 4 oz (66.8 kg)  Height: 5' 7.5" (1.715 m)   Body mass index is 22.72 kg/m.   Physical Exam Vitals reviewed.  Constitutional:      Appearance: Normal appearance.  HENT:     Head: Normocephalic.     Mouth/Throat:     Mouth: Mucous membranes are moist.     Pharynx: Oropharynx is clear.  Eyes:     Extraocular Movements: Extraocular movements intact.     Pupils: Pupils are equal, round, and reactive to light.     Comments: Mild conjunctival injection bilaterally.  No discharge.  Cardiovascular:     Rate and Rhythm: Normal rate.  Pulmonary:     Effort: Pulmonary effort is normal.  Musculoskeletal:     Cervical back: No tenderness.  Lymphadenopathy:     Cervical: No cervical  adenopathy.  Skin:    General:  Skin is warm and dry.     Findings: Lesion (Right cheek) present.  Neurological:     Mental Status: He is alert and oriented to person, place, and time.  Psychiatric:        Mood and Affect: Mood normal.        Behavior: Behavior normal.     ASSESSMENT & PLAN: Problem List Items Addressed This Visit       Other   Allergic conjunctivitis of both eyes - Primary    Non-smoker.  No infectious component. Will benefit from azelastine eyedrops Needs ophthalmology evaluation Diabetic      Relevant Medications   azelastine (OPTIVAR) 0.05 % ophthalmic solution   Facial lesion    Recommend dermatology evaluation      Relevant Orders   Ambulatory referral to Dermatology   Patient Instructions  Must follow-up with ophthalmologist as discussed. Also follow-up with dermatologist  Allergic Conjunctivitis, Adult  Allergic conjunctivitis is inflammation of the clear membrane that covers the white part of your eye and the inner surface of your eyelid. This area is called the conjunctiva. This condition can make your eye red or pink. It can also make your eye feel itchy. This condition is not contagious. This means that it cannot be spread from one person to another person. What are the causes? This condition is caused by allergens. These are things that can cause an allergic reaction in some people. Common allergens include: Outdoor allergens, such as: Pollen, including pollen from grass and weeds. Mold. Car fumes. Indoor allergens, such as: Dust. Smoke. Mold. Proteins in a pet's pee (urine), saliva, or dander. Proteins that build up in contact lenses. What increases the risk? You are more likely to develop this condition if you have a family history of these things: Allergies. Conditions that you get because of allergens, such as asthma or inflammation of the skin (eczema). What are the signs or symptoms? Symptoms of this condition include eyes that  are: Itchy. Red. Watery. Puffy. Your eyes may also: Sting or burn. Have clear fluid draining from them. Have thick mucus coming from them. This happens in severe cases. How is this treated? Treatment for this condition may include: Using cold, wet cloths (cold compresses) to soothe itching and swelling. Washing the face, hair, and clothing to remove allergens. Using eye drops. These may include: Eye drops that block allergies. Eye drops that reduce swelling and irritation. Steroid eye drops if other treatments have not worked. Oral antihistamine medicines. These medicines lessen your allergies. You may need these if eye drops do not help or are difficult to use. Air purifier at home and work. Wrap around sunglasses. This may help to block allergens from reaching the eye. Not wearing contact lenses, if the doctor has found that contact lenses caused your symptoms. Use daily wear disposal contact lenses instead. Follow these instructions at home: Eye care Place a cool, clean washcloth on your eye for 10-20 minutes. Do this 3-4 times a day. Do not touch or rub your eyes. Do not wear contact lenses until the inflammation is gone. Wear glasses instead. Do not wear eye makeup until the inflammation is gone. General instructions Try not to be around things that you are allergic to. Take or apply over-the-counter and prescription medicines only as told by your doctor. These include any eye drops. Drink enough fluid to keep your pee pale yellow. Keep all follow-up visits. Contact a doctor if: Your symptoms  get worse. Your symptoms do not get better with treatment. You have mild eye pain. You are sensitive to light. You have spots or blisters on your eyes. You have pus coming from your eye. You have a fever. Get help right away if: You have redness, swelling, or other symptoms in only one eye. You cannot see well. You have other vision changes. You have very bad eye  pain. Summary Allergic conjunctivitis is caused by allergens. It can make your eye red or pink, and it can make your eye feel itchy. This condition cannot be spread from one person to another person. Avoid things that you are allergic to. Take or apply over-the-counter and prescription medicines only as told by your doctor. These include any eye drops. Contact your doctor if your symptoms get worse or they do not get better with treatment. This information is not intended to replace advice given to you by your health care provider. Make sure you discuss any questions you have with your health care provider. Document Revised: 11/13/2021 Document Reviewed: 11/13/2021 Elsevier Patient Education  [place], MD [facility]

## 2022-09-07 NOTE — Assessment & Plan Note (Signed)
Non-smoker.  No infectious component. Will benefit from azelastine eyedrops Needs ophthalmology evaluation Diabetic

## 2022-09-07 NOTE — Assessment & Plan Note (Signed)
Recommend dermatology evaluation

## 2022-09-29 DIAGNOSIS — M4712 Other spondylosis with myelopathy, cervical region: Secondary | ICD-10-CM | POA: Diagnosis not present

## 2022-09-29 DIAGNOSIS — M4722 Other spondylosis with radiculopathy, cervical region: Secondary | ICD-10-CM | POA: Diagnosis not present

## 2022-10-01 ENCOUNTER — Other Ambulatory Visit: Payer: Self-pay | Admitting: Neurosurgery

## 2022-10-06 DIAGNOSIS — L818 Other specified disorders of pigmentation: Secondary | ICD-10-CM | POA: Diagnosis not present

## 2022-10-10 ENCOUNTER — Other Ambulatory Visit: Payer: Self-pay | Admitting: Emergency Medicine

## 2022-10-10 DIAGNOSIS — E1165 Type 2 diabetes mellitus with hyperglycemia: Secondary | ICD-10-CM

## 2022-12-01 ENCOUNTER — Other Ambulatory Visit: Payer: Self-pay | Admitting: Emergency Medicine

## 2022-12-14 ENCOUNTER — Ambulatory Visit: Payer: Medicare HMO | Admitting: Emergency Medicine

## 2022-12-14 NOTE — Pre-Procedure Instructions (Signed)
Surgical Instructions    Your procedure is scheduled on Tuesday, April 9th..  Report to [facility] Main Entrance "A" at 6:00 A.M., then check in with the Admitting office.  Call this number if you have problems the morning of surgery:  [phone]  If you have any questions prior to your surgery date call [phone]: Open Monday-Friday 8am-4pm If you experience any cold or flu symptoms such as cough, fever, chills, shortness of breath, etc. between now and your scheduled surgery, please notify us at the above number.     Remember:  Do not eat or drink after midnight the night before your surgery   Take these medicines the morning of surgery with A SIP OF WATER  amLODipine (NORVASC)  cetirizine (ZYRTEC)  pantoprazole (PROTONIX)  primidone (MYSOLINE)  propranolol (INDERAL)    Take these medications as needed: Eye drops isosorbide mononitrate (IMDUR)   Follow your surgeon's instructions on when to stop Aspirin.  If no instructions were given by your surgeon then you will need to call the office to get those instructions.    As of today, STOP taking any Aleve, Naproxen, Ibuprofen, Motrin, Advil, Goody's, BC's, all herbal medications, fish oil, and all vitamins.          WHAT DO I DO ABOUT MY DIABETES MEDICATION?   Do not take metFORMIN (GLUCOPHAGE) the morning of surgery.   HOW TO MANAGE YOUR DIABETES BEFORE AND AFTER SURGERY  Why is it important to control my blood sugar before and after surgery? Improving blood sugar levels before and after surgery helps healing and can limit problems. A way of improving blood sugar control is eating a healthy diet by:  Eating less sugar and carbohydrates  Increasing activity/exercise  Talking with your doctor about reaching your blood sugar goals High blood sugars (greater than 180 mg/dL) can raise your risk of infections and slow your recovery, so you will need to focus on controlling your diabetes during the weeks before  surgery. Make sure that the doctor who takes care of your diabetes knows about your planned surgery including the date and location.  How do I manage my blood sugar before surgery? Check your blood sugar at least 4 times a day, starting 2 days before surgery, to make sure that the level is not too high or low.  Check your blood sugar the morning of your surgery when you wake up and every 2 hours until you get to the Short Stay unit.  If your blood sugar is less than 70 mg/dL, you will need to treat for low blood sugar: Do not take insulin. Treat a low blood sugar (less than 70 mg/dL) with  cup of clear juice (cranberry or apple), 4 glucose tablets, OR glucose gel. Recheck blood sugar in 15 minutes after treatment (to make sure it is greater than 70 mg/dL). If your blood sugar is not greater than 70 mg/dL on recheck, call [phone] for further instructions. Report your blood sugar to the short stay nurse when you get to Short Stay.  If you are admitted to the hospital after surgery: Your blood sugar will be checked by the staff and you will probably be given insulin after surgery (instead of oral diabetes medicines) to make sure you have good blood sugar levels. The goal for blood sugar control after surgery is 80-180 mg/dL.  Do NOT Smoke (Tobacco/Vaping) for 24 hours prior to your procedure.  If you use a CPAP at night, you may bring your mask/headgear for your  overnight stay.   Contacts, glasses, piercing's, hearing aid's, dentures or partials may not be worn into surgery, please bring cases for these belongings.    For patients admitted to the hospital, discharge time will be determined by your treatment team.   Patients discharged the day of surgery will not be allowed to drive home, and someone needs to stay with them for 24 hours.  SURGICAL WAITING ROOM VISITATION Patients having surgery or a procedure may have no more than 2 support people in the waiting area - these visitors  may rotate.   Children under the age of 32 must have an adult with them who is not the patient. If the patient needs to stay at the hospital during part of their recovery, the visitor guidelines for inpatient rooms apply. Pre-op nurse will coordinate an appropriate time for 1 support person to accompany patient in pre-op.  This support person may not rotate.   Please refer to the [facility] website for the visitor guidelines for Inpatients (after your surgery is over and you are in a regular room).    Special instructions:   [facility]- Preparing For Surgery  Before surgery, you can play an important role. Because skin is not sterile, your skin needs to be as free of germs as possible. You can reduce the number of germs on your skin by washing with CHG (chlorahexidine gluconate) Soap before surgery.  CHG is an antiseptic cleaner which kills germs and bonds with the skin to continue killing germs even after washing.    Oral Hygiene is also important to reduce your risk of infection.  Remember - BRUSH YOUR TEETH THE MORNING OF SURGERY WITH YOUR REGULAR TOOTHPASTE  Please do not use if you have an allergy to CHG or antibacterial soaps. If your skin becomes reddened/irritated stop using the CHG.  Do not shave (including legs and underarms) for at least 48 hours prior to first CHG shower. It is OK to shave your face.  Please follow these instructions carefully.   Shower the NIGHT BEFORE SURGERY and the MORNING OF SURGERY  If you chose to wash your hair, wash your hair first as usual with your normal shampoo.  After you shampoo, rinse your hair and body thoroughly to remove the shampoo.  Use CHG Soap as you would any other liquid soap. You can apply CHG directly to the skin and wash gently with a scrungie or a clean washcloth.   Apply the CHG Soap to your body ONLY FROM THE NECK DOWN.  Do not use on open wounds or open sores. Avoid contact with your eyes, ears, mouth and genitals (private  parts). Wash Face and genitals (private parts)  with your normal soap.   Wash thoroughly, paying special attention to the area where your surgery will be performed.  Thoroughly rinse your body with warm water from the neck down.  DO NOT shower/wash with your normal soap after using and rinsing off the CHG Soap.  Pat yourself dry with a CLEAN TOWEL.  Wear CLEAN PAJAMAS to bed the night before surgery  Place CLEAN SHEETS on your bed the night before your surgery  DO NOT SLEEP WITH PETS.   Day of Surgery: Take a shower with CHG soap. Do not wear jewelry Do not wear lotions, powders, colognes, or deodorant. Men may shave face and neck. Do not bring valuables to the hospital.  [facility] is not responsible for any belongings or valuables. Wear Clean/Comfortable clothing the morning of surgery Remember  to brush your teeth WITH YOUR REGULAR TOOTHPASTE.   Please read over the following fact sheets that you were given.    If you received a COVID test during your pre-op visit  it is requested that you wear a mask when out in public, stay away from anyone that may not be feeling well and notify your surgeon if you develop symptoms. If you have been in contact with anyone that has tested positive in the last 10 days please notify you surgeon.

## 2022-12-15 ENCOUNTER — Other Ambulatory Visit: Payer: Self-pay

## 2022-12-15 ENCOUNTER — Encounter (HOSPITAL_COMMUNITY): Payer: Self-pay

## 2022-12-15 ENCOUNTER — Encounter (HOSPITAL_COMMUNITY)
Admission: RE | Admit: 2022-12-15 | Discharge: 2022-12-15 | Disposition: A | Discharge status: Home / Self Care | Payer: No Typology Code available for payment source | Source: Ambulatory Visit | Attending: Neurosurgery | Admitting: Neurosurgery

## 2022-12-15 VITALS — BP 137/66 | HR 51 | Temp 98.1°F | Resp 18 | Ht 68.0 in | Wt 151.3 lb

## 2022-12-15 DIAGNOSIS — Z01818 Encounter for other preprocedural examination: Secondary | ICD-10-CM | POA: Diagnosis not present

## 2022-12-15 DIAGNOSIS — E119 Type 2 diabetes mellitus without complications: Secondary | ICD-10-CM

## 2022-12-15 DIAGNOSIS — E1159 Type 2 diabetes mellitus with other circulatory complications: Secondary | ICD-10-CM

## 2022-12-15 DIAGNOSIS — Z8619 Personal history of other infectious and parasitic diseases: Secondary | ICD-10-CM

## 2022-12-15 DIAGNOSIS — I152 Hypertension secondary to endocrine disorders: Secondary | ICD-10-CM | POA: Insufficient documentation

## 2022-12-15 HISTORY — DX: Cerebral infarction, unspecified: I63.9

## 2022-12-15 HISTORY — DX: Unspecified osteoarthritis, unspecified site: M19.90

## 2022-12-15 HISTORY — DX: Personal history of other diseases of the digestive system: Z87.19

## 2022-12-15 HISTORY — DX: Headache, unspecified: R51.9

## 2022-12-15 HISTORY — DX: Essential (primary) hypertension: I10

## 2022-12-15 HISTORY — DX: Gastro-esophageal reflux disease without esophagitis: K21.9

## 2022-12-15 LAB — COMPREHENSIVE METABOLIC PANEL
ALT: 10 U/L (ref 0–44)
AST: 15 U/L (ref 15–41)
Albumin: 3.9 g/dL (ref 3.5–5.0)
Alkaline Phosphatase: 43 U/L (ref 38–126)
Anion gap: 7 (ref 5–15)
BUN: 8 mg/dL (ref 8–23)
CO2: 28 mmol/L (ref 22–32)
Calcium: 9.5 mg/dL (ref 8.9–10.3)
Chloride: 103 mmol/L (ref 98–111)
Creatinine, Ser: 1.01 mg/dL (ref 0.61–1.24)
GFR, Estimated: >60 mL/min (ref >60)
Glucose, Bld: 100 mg/dL — ABNORMAL HIGH (ref 70–99)
Potassium: 4 mmol/L (ref 3.5–5.1)
Sodium: 138 mmol/L (ref 135–145)
Total Bilirubin: 0.6 mg/dL (ref 0.3–1.2)
Total Protein: 7 g/dL (ref 6.5–8.1)

## 2022-12-15 LAB — CBC
HCT: 42.8 % (ref 39.0–52.0)
Hemoglobin: 14.4 g/dL (ref 13.0–17.0)
MCH: 26.5 pg (ref 26.0–34.0)
MCHC: 33.6 g/dL (ref 30.0–36.0)
MCV: 78.7 fL — ABNORMAL LOW (ref 80.0–100.0)
Platelets: 185 10*3/uL (ref 150–400)
RBC: 5.44 MIL/uL (ref 4.22–5.81)
RDW: 14.1 % (ref 11.5–15.5)
WBC: 5.3 10*3/uL (ref 4.0–10.5)
nRBC: 0 % (ref 0.0–0.2)

## 2022-12-15 LAB — SURGICAL PCR SCREEN
MRSA, PCR: NEGATIVE
Staphylococcus aureus: NEGATIVE

## 2022-12-15 LAB — TYPE AND SCREEN
ABO/RH(D): B POS
Antibody Screen: NEGATIVE

## 2022-12-15 LAB — HEMOGLOBIN A1C
Hgb A1c MFr Bld: 6.6 % — ABNORMAL HIGH (ref 4.8–5.6)
Mean Plasma Glucose: 143 mg/dL

## 2022-12-15 LAB — GLUCOSE, CAPILLARY: Glucose-Capillary: 103 mg/dL — ABNORMAL HIGH (ref 70–99)

## 2022-12-15 NOTE — Progress Notes (Deleted)
Pt did not show up for PAT appt. Spoke with pt and he stated he did not know he had an appt. Chart given to [facility] to reschedule pt for PAT

## 2022-12-15 NOTE — Progress Notes (Signed)
PCP - Dr. Agustina Caroli  Cardiologist - Pt saw Dr. Irish Lack in 2021, following his cardiac cath and stent. He said he briefly followed someone in Cardiology at [facility] but he cannot remember their name. He said he was told that he no longer needed to follow cardiology. He states that Dr. Herold Harms sees him at [facility] and follows him in regards to his stent and primary care issues.  PPM/ICD - denies   Chest x-ray - 12/08/16 EKG - 12/15/22 Stress Test - 10+ years ago per pt, at [facility] ECHO - 11/01/15 Cardiac Cath - 09/24/19  Sleep Study - denies   Fasting Blood Sugar - pt states he doesn't check his sugar until after he eats. He says after breakfast, his CBG is usually around 200  Checks Blood Sugar 2-3 times a day  Last dose of GLP1 agonist-  n/a  Blood Thinner Instructions: n/a Aspirin Instructions: f/u with surgeon  ERAS Protcol - no, NPO   COVID TEST- n/a   Anesthesia review: yes, hx of CAD, records requested from [facility]  Patient denies shortness of breath, fever, cough and chest pain at PAT appointment   All instructions explained to the patient, with a verbal understanding of the material. Patient agrees to go over the instructions while at home for a better understanding.  The opportunity to ask questions was provided.

## 2022-12-15 NOTE — Progress Notes (Addendum)
Anesthesia Chart Review:  Case: [id number] Date/Time: 12/28/22 0745   Procedure: ACDF - C3-C4 - C4-C5 - C5-C6 - C6-C7   Anesthesia type: General   Pre-op diagnosis: spondylosis with myelopathy   [facility] ROOM 20 / [facility] OR   Surgeons: Julio SicksPool, Henry, MD       DISCUSSION: Patient is a 72 year old male scheduled for the above procedure.  History includes smoking, DM2, CAD (DES proximal RCA 09/24/19), hypercholesterolemia, MDD, agoraphobia, TIA (2017), essential tremor, hiatal hernia, GERD, hepatitis C (s/p treatment).   [facility], but [facility] Although [facility], he says that since he was doing well from a CV standpoint that is is now primarily [facility] Dr. Jess BartersBorum for bot his medical and cardiac issues. Based on a 08/24/22 pre-colonoscopy [facility] Anesthesia Pre-Procedure Assessment, last cardiology follow-up was on 11/06/20. At that time doing well, with occasional atypical symptoms. Normal LVEF by 2021 echo. He was started on daily isosorbide. If persistent symptoms then could consider further evaluation at that time. He did not have formal cardiology evaluation at that time because he was asymptomatic and able to achieve > 4 METS, but cardiology service did give permission to hold Plavix. However, patient says endoscopy procedures have been put off until April. 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 He is > 12 months past DES, has not been having had chest pain, SOB, syncope, edema and stays active doing yard work (including Firefighterpush mower) and is remodeling his bathroom. He says he could climb 2 flights of stairs if needed, but admits to not having to do so recently. He continues Imdur and other medications as listed below..     Meds include amlodipine 5 mg  daily, ASA 81 mg daily, Zyrtec 10 mg daily, Imdur 15 mg daily, lisinopril 20 mg daily, metformin 250-500 mg daily, fish oil 1000 mg daily, Protonix 40 mg BID, primidone 50 mg BID, propranolol 10 mg 1.5 tab BID, Zocor 20 mg Q HS.   Erie NoeVanessa at Dr. Lindalou HosePool's office indicated surgical clearance was requested from [facility] provider. Discussed with anesthesiologist Adonis HugueninSinger, Dan, MD. Patient to follow-up with surgeon regarding perioperative ASA instructions.   A1c in process. (7.[facility])  [facility] 12/16/22 5:55 PM: A1c 6.6%. This morning, I also called and left a message with staff at Dr. Garner NashBorum's office for her or nursing staff to call as I wanted to clarify clearance request/recommendations. She was reportedly in the office, but I did not hear back. Tomorrow is Good Friday, so their office will be closed and will have to follow-up next week.   ADDENDUM 12/23/21 5:43 PM: Dr. Jess BartersBorum called and spoke with me on 12/22/22. She was going to reach out to cardiology for any additional preoperative input. He had telephonic cardiology evaluation with Lorelle GibbsAndrew Fulp, PA on 12/24/22. Ok to remain off Plavix and continue ASA as atypical chest symptoms from a few years ago had resolved and DES was > 1 year out. He noted labile home BP readings with SBP 170s and amlodipine increased on 12/23/22. Continue lisinopril propranolol, simvastatin. Appears Imdur was resumed (although patient had indicated to me that he was still taking). In regards to surgical clearance he wrote, "Scheduled for ACDF next week with community  provider in [place]. We discussed this by phone today. He has had modestly increased risk due to history of ischemic heart disease and PCI. However, he remains symptomatically stable, can proceed without further preoperative testing." Follow-up in ~ 3 months planned.    VS: BP 137/66   Pulse (!) 51   Temp 36.7 C (Oral)   Resp 18   Ht 5\' 8"  (1.727 m)   Wt 68.6 kg   SpO2 100%   BMI 23.01 kg/m     PROVIDERS: [name], MD is [facility] PCP. [facility] PCP is Leonie Douglas, MD at their "[facility]" in [place]. . - He reported seeing a cardiologist at the [facility], last visit ~ 2022.     LABS: Labs reviewed: Acceptable for surgery. (all labs ordered are listed, but only abnormal results are displayed)  Labs Reviewed  GLUCOSE, CAPILLARY - Abnormal; Notable for the following components:      Result Value   Glucose-Capillary 103 (*)    All other components within normal limits  COMPREHENSIVE METABOLIC PANEL - Abnormal; Notable for the following components:   Glucose, Bld 100 (*)    All other components within normal limits  CBC - Abnormal; Notable for the following components:   MCV 78.7 (*)    All other components within normal limits  SURGICAL PCR SCREEN  HEMOGLOBIN A1C  TYPE AND SCREEN     IMAGES: US Thyroid 09/16/22: IMPRESSION: Solid and cystic left thyroid nodules do not meet criteria for FNA or imaging follow-up. - The above is in keeping with the ACR TI-RADS recommendations - J Am Coll Radiol 2017;14:587-595.  MRI C-spine 07/29/22: IMPRESSION: 1. C5-C6 mild spinal canal stenosis, with moderate to severe bilateral neural foraminal narrowing, which has progressed. 2. C3-C4 and C4-C5 mild-to-moderate spinal canal stenosis and mild bilateral neural foraminal narrowing, unchanged. 3. C6-C7 mild spinal canal stenosis and moderate bilateral neural foraminal narrowing, unchanged.     EKG: 12/15/22:  Sinus bradycardia at 49 bpm Nonspecific ST and T wave abnormality   CV: According to 08/24/22 [facility] pre-colonoscopy Anesthesia Evaluation: Echo 08/19/20: "Summary Statements The left ventricular size, thickness and function are normal: Ejection Fraction = 55-60% The right ventricle is normal in size and function. There is mild tricuspid regurgitation. The IVC is normal in size with an inspiratory collapse of greater then  50%, suggesting normal right atrial pressure. Estimated PASP , normal There is no pericardial effusion."    Cardiac cath 09/24/19: The left ventricular systolic function is normal. LV end diastolic pressure is low; left heart cath done due to low BP post sedation. Additional IV hydration was given for low BP The left ventricular ejection fraction is 55-65% by visual estimate. LVEF checked since there was no recent LVEF on the chart and additional fluids were needed. There is no aortic valve stenosis. Prox RCA lesion is 90% stenosed. A drug-eluting stent was successfully placed using a SYNERGY XD [id number], postdilated to 2.75 mm. Post intervention, there is a 0% residual stenosis. Hypotension resolved during the procedure. Continue aspirin and Plavix.  He needs to stop smoking.    Past Medical History:  Diagnosis Date   Agoraphobia    Anxiety    Arthritis    Coronary artery disease    1 stent   Diabetes mellitus without complication (HCC)    Essential Tremor    GERD (gastroesophageal reflux disease)    Headache    Hepatitis 2012   pt states he had Hep C but was treated  History of hiatal hernia    Hypercholesteremia    Hypertension    MDD (major depressive disorder)    Psychosis (HCC)    Stroke (HCC)    "series of little mini strokes" per pt    Past Surgical History:  Procedure Laterality Date   CARDIAC CATHETERIZATION     CHOLECYSTECTOMY     CORONARY STENT INTERVENTION N/A 09/24/2019   Procedure: CORONARY STENT INTERVENTION;  Surgeon: Corky CraftsVaranasi, Jayadeep S, MD;  [facility] INV[facility] CV [facility];  Service: Cardiovascular;  Laterality: N/A;   LEFT HEART CATH N/A 09/24/2019   Procedure: Left Heart Cath;  Surgeon: Corky CraftsVaranasi, Jayadeep S, MD;  [facility];  Service: Cardiovascular;  Laterality: N/A;    MEDICATIONS:  Alcohol Swabs (ALCOHOL PREP PAD) 70 % PADS   Alcohol Swabs (DROPSAFE ALCOHOL PREP) 70 % PADS   amLODipine (NORVASC) 5 MG tablet   aspirin EC 81  MG tablet   azelastine (OPTIVAR) 0.05 % ophthalmic solution   blood glucose meter kit and supplies   Carboxymethylcellulose Sod PF (REFRESH PLUS) 0.5 % SOLN   cetirizine (ZYRTEC) 10 MG tablet   diphenhydramine-acetaminophen (TYLENOL PM) 25-500 MG TABS tablet   famotidine (PEPCID) 20 MG tablet   Glucose 15 GM/32ML GEL   isosorbide mononitrate (IMDUR) 30 MG 24 hr tablet   ketotifen (ZADITOR) 0.035 % ophthalmic solution   lisinopril (ZESTRIL) 40 MG tablet   metFORMIN (GLUCOPHAGE) 500 MG tablet   Omega-3 Fatty Acids (FISH OIL) 1000 MG CAPS   pantoprazole (PROTONIX) 40 MG tablet   primidone (MYSOLINE) 50 MG tablet   propranolol (INDERAL) 10 MG tablet   simvastatin (ZOCOR) 20 MG tablet   terbinafine (LAMISIL) 1 % cream   triamcinolone cream (KENALOG) 0.1 %   TRUE METRIX BLOOD GLUCOSE TEST test strip   TRUEplus Lancets 33G MISC   No current facility-administered medications for this encounter.

## 2022-12-24 NOTE — Anesthesia Preprocedure Evaluation (Signed)
Anesthesia Evaluation  Patient identified by MRN, date of birth, ID band Patient awake    Reviewed: Allergy & Precautions, NPO status , Patient's Chart, lab work & pertinent test results  Airway Mallampati: II  TM Distance: >3 FB Neck ROM: Limited    Dental no notable dental hx.    Pulmonary Current Smoker   Pulmonary exam normal        Cardiovascular hypertension, Pt. on medications and Pt. on home beta blockers + CAD and + Cardiac Stents   Rhythm:Regular Rate:Normal     Neuro/Psych  Headaches  Anxiety Depression    TIACVA    GI/Hepatic Neg liver ROS, hiatal hernia,GERD  ,,  Endo/Other  diabetes, Type 2, Oral Hypoglycemic Agents    Renal/GU negative Renal ROS     Musculoskeletal  (+) Arthritis , Osteoarthritis,    Abdominal Normal abdominal exam  (+)   Peds  Hematology negative hematology ROS (+)   Anesthesia Other Findings   Reproductive/Obstetrics                             Anesthesia Physical Anesthesia Plan  ASA: 3  Anesthesia Plan: General   Post-op Pain Management: Celebrex PO (pre-op)* and Tylenol PO (pre-op)*   Induction: Intravenous  PONV Risk Score and Plan: 1 and Ondansetron, Dexamethasone and Treatment may vary due to age or medical condition  Airway Management Planned: Mask, Oral ETT and Video Laryngoscope Planned  Additional Equipment: None  Intra-op Plan:   Post-operative Plan: Extubation in OR  Informed Consent: I have reviewed the patients History and Physical, chart, labs and discussed the procedure including the risks, benefits and alternatives for the proposed anesthesia with the patient or authorized representative who has indicated his/her understanding and acceptance.     Dental advisory given  Plan Discussed with: CRNA  Anesthesia Plan Comments: (PAT note written by Shonna Chock, PA-C.  )       Anesthesia Quick Evaluation

## 2022-12-28 ENCOUNTER — Other Ambulatory Visit: Payer: Self-pay

## 2022-12-28 ENCOUNTER — Inpatient Hospital Stay (HOSPITAL_COMMUNITY): Payer: No Typology Code available for payment source

## 2022-12-28 ENCOUNTER — Inpatient Hospital Stay (HOSPITAL_COMMUNITY)
Admission: RE | Admit: 2022-12-28 | Discharge: 2022-12-29 | DRG: 473 | Disposition: A | Discharge status: Home / Self Care | Payer: No Typology Code available for payment source | Source: Ambulatory Visit | Attending: Neurosurgery | Admitting: Neurosurgery

## 2022-12-28 ENCOUNTER — Inpatient Hospital Stay (HOSPITAL_COMMUNITY): Payer: No Typology Code available for payment source | Admitting: Vascular Surgery

## 2022-12-28 ENCOUNTER — Encounter (HOSPITAL_COMMUNITY)
Admission: RE | Disposition: A | Discharge status: Home / Self Care | Payer: Self-pay | Source: Ambulatory Visit | Attending: Neurosurgery

## 2022-12-28 ENCOUNTER — Encounter (HOSPITAL_COMMUNITY): Payer: Self-pay | Admitting: Neurosurgery

## 2022-12-28 ENCOUNTER — Inpatient Hospital Stay (HOSPITAL_COMMUNITY): Payer: No Typology Code available for payment source | Admitting: Anesthesiology

## 2022-12-28 DIAGNOSIS — I10 Essential (primary) hypertension: Secondary | ICD-10-CM | POA: Diagnosis present

## 2022-12-28 DIAGNOSIS — Z9102 Food additives allergy status: Secondary | ICD-10-CM

## 2022-12-28 DIAGNOSIS — Z955 Presence of coronary angioplasty implant and graft: Secondary | ICD-10-CM | POA: Diagnosis not present

## 2022-12-28 DIAGNOSIS — Z8673 Personal history of transient ischemic attack (TIA), and cerebral infarction without residual deficits: Secondary | ICD-10-CM

## 2022-12-28 DIAGNOSIS — M4722 Other spondylosis with radiculopathy, cervical region: Secondary | ICD-10-CM

## 2022-12-28 DIAGNOSIS — I2510 Atherosclerotic heart disease of native coronary artery without angina pectoris: Secondary | ICD-10-CM

## 2022-12-28 DIAGNOSIS — Z7984 Long term (current) use of oral hypoglycemic drugs: Secondary | ICD-10-CM

## 2022-12-28 DIAGNOSIS — F17210 Nicotine dependence, cigarettes, uncomplicated: Secondary | ICD-10-CM

## 2022-12-28 DIAGNOSIS — Z79899 Other long term (current) drug therapy: Secondary | ICD-10-CM | POA: Diagnosis not present

## 2022-12-28 DIAGNOSIS — G250 Essential tremor: Secondary | ICD-10-CM | POA: Diagnosis present

## 2022-12-28 DIAGNOSIS — K219 Gastro-esophageal reflux disease without esophagitis: Secondary | ICD-10-CM | POA: Diagnosis present

## 2022-12-28 DIAGNOSIS — E7800 Pure hypercholesterolemia, unspecified: Secondary | ICD-10-CM | POA: Diagnosis present

## 2022-12-28 DIAGNOSIS — M4802 Spinal stenosis, cervical region: Secondary | ICD-10-CM | POA: Diagnosis present

## 2022-12-28 DIAGNOSIS — Z809 Family history of malignant neoplasm, unspecified: Secondary | ICD-10-CM

## 2022-12-28 DIAGNOSIS — Z888 Allergy status to other drugs, medicaments and biological substances status: Secondary | ICD-10-CM

## 2022-12-28 DIAGNOSIS — M4712 Other spondylosis with myelopathy, cervical region: Principal | ICD-10-CM | POA: Diagnosis present

## 2022-12-28 DIAGNOSIS — Z7982 Long term (current) use of aspirin: Secondary | ICD-10-CM | POA: Diagnosis not present

## 2022-12-28 DIAGNOSIS — E119 Type 2 diabetes mellitus without complications: Secondary | ICD-10-CM | POA: Diagnosis present

## 2022-12-28 HISTORY — PX: ANTERIOR CERVICAL DECOMPRESSION/DISCECTOMY FUSION 4 LEVELS: SHX5556

## 2022-12-28 LAB — GLUCOSE, CAPILLARY
Glucose-Capillary: 119 mg/dL — ABNORMAL HIGH (ref 70–99)
Glucose-Capillary: 148 mg/dL — ABNORMAL HIGH (ref 70–99)
Glucose-Capillary: 210 mg/dL — ABNORMAL HIGH (ref 70–99)

## 2022-12-28 LAB — ABO/RH: ABO/RH(D): B POS

## 2022-12-28 SURGERY — ANTERIOR CERVICAL DECOMPRESSION/DISCECTOMY FUSION 4 LEVELS
Anesthesia: General | Site: Spine Cervical

## 2022-12-28 MED ORDER — LIDOCAINE 2% (20 MG/ML) 5 ML SYRINGE
INTRAMUSCULAR | Status: AC
  Filled 2022-12-28: qty 5

## 2022-12-28 MED ORDER — HYDROCODONE-ACETAMINOPHEN 5-325 MG PO TABS
ORAL_TABLET | ORAL | Status: AC
  Filled 2022-12-28: qty 1

## 2022-12-28 MED ORDER — PHENOL 1.4 % MT LIQD
1.0000 | OROMUCOSAL | Status: DC | PRN
  Administered 2022-12-29: 1 via OROMUCOSAL
  Filled 2022-12-28: qty 177

## 2022-12-28 MED ORDER — PROPRANOLOL HCL 10 MG PO TABS
15.0000 mg | ORAL_TABLET | Freq: Three times a day (TID) | ORAL | Status: DC
  Administered 2022-12-28 (×2): 15 mg via ORAL
  Filled 2022-12-28 (×3): qty 1.5

## 2022-12-28 MED ORDER — CEFAZOLIN SODIUM-DEXTROSE 2-4 GM/100ML-% IV SOLN
2.0000 g | INTRAVENOUS | Status: AC
  Administered 2022-12-28: 2 g via INTRAVENOUS
  Filled 2022-12-28: qty 100

## 2022-12-28 MED ORDER — EPHEDRINE 5 MG/ML INJ
INTRAVENOUS | Status: AC
  Filled 2022-12-28: qty 5

## 2022-12-28 MED ORDER — HYDROCODONE-ACETAMINOPHEN 10-325 MG PO TABS
2.0000 | ORAL_TABLET | ORAL | Status: DC | PRN
  Administered 2022-12-28 – 2022-12-29 (×3): 2 via ORAL
  Filled 2022-12-28 (×3): qty 2

## 2022-12-28 MED ORDER — METFORMIN HCL 500 MG PO TABS: 250.0000 mg | ORAL_TABLET | Freq: Three times a day (TID) | ORAL | Status: DC | PRN

## 2022-12-28 MED ORDER — LISINOPRIL 20 MG PO TABS
20.0000 mg | ORAL_TABLET | Freq: Every day | ORAL | Status: DC
  Administered 2022-12-28: 20 mg via ORAL
  Filled 2022-12-28: qty 1

## 2022-12-28 MED ORDER — SODIUM CHLORIDE 0.9% FLUSH: 3.0000 mL | INTRAVENOUS | Status: DC | PRN

## 2022-12-28 MED ORDER — ONDANSETRON HCL 4 MG/2ML IJ SOLN
INTRAMUSCULAR | Status: DC | PRN
  Administered 2022-12-28: 4 mg via INTRAVENOUS

## 2022-12-28 MED ORDER — PROPOFOL 10 MG/ML IV BOLUS
INTRAVENOUS | Status: DC | PRN
  Administered 2022-12-28: 140 mg via INTRAVENOUS

## 2022-12-28 MED ORDER — ONDANSETRON HCL 4 MG/2ML IJ SOLN
INTRAMUSCULAR | Status: AC
  Filled 2022-12-28: qty 2

## 2022-12-28 MED ORDER — ONDANSETRON HCL 4 MG PO TABS: 4.0000 mg | ORAL_TABLET | Freq: Four times a day (QID) | ORAL | Status: DC | PRN

## 2022-12-28 MED ORDER — THROMBIN 20000 UNITS EX SOLR: CUTANEOUS | Status: DC | PRN

## 2022-12-28 MED ORDER — CYCLOBENZAPRINE HCL 10 MG PO TABS
10.0000 mg | ORAL_TABLET | Freq: Three times a day (TID) | ORAL | Status: DC | PRN
  Administered 2022-12-28 (×2): 10 mg via ORAL
  Filled 2022-12-28: qty 1

## 2022-12-28 MED ORDER — DEXAMETHASONE SODIUM PHOSPHATE 10 MG/ML IJ SOLN
INTRAMUSCULAR | Status: DC | PRN
  Administered 2022-12-28: 10 mg via INTRAVENOUS

## 2022-12-28 MED ORDER — OMEGA-3-ACID ETHYL ESTERS 1 G PO CAPS
1.0000 g | ORAL_CAPSULE | Freq: Two times a day (BID) | ORAL | Status: DC
  Administered 2022-12-28: 1 g via ORAL
  Filled 2022-12-28: qty 1

## 2022-12-28 MED ORDER — PROPOFOL 500 MG/50ML IV EMUL
INTRAVENOUS | Status: DC | PRN
  Administered 2022-12-28: 15 ug/kg/min via INTRAVENOUS

## 2022-12-28 MED ORDER — PANTOPRAZOLE SODIUM 40 MG PO TBEC
40.0000 mg | DELAYED_RELEASE_TABLET | Freq: Two times a day (BID) | ORAL | Status: DC
  Administered 2022-12-28: 40 mg via ORAL
  Filled 2022-12-28: qty 1

## 2022-12-28 MED ORDER — CELECOXIB 200 MG PO CAPS
200.0000 mg | ORAL_CAPSULE | Freq: Once | ORAL | Status: AC
  Administered 2022-12-28: 200 mg via ORAL
  Filled 2022-12-28: qty 1

## 2022-12-28 MED ORDER — DEXMEDETOMIDINE HCL IN NACL 80 MCG/20ML IV SOLN
INTRAVENOUS | Status: AC
  Filled 2022-12-28: qty 20

## 2022-12-28 MED ORDER — ROCURONIUM BROMIDE 10 MG/ML (PF) SYRINGE
PREFILLED_SYRINGE | INTRAVENOUS | Status: DC | PRN
  Administered 2022-12-28 (×3): 10 mg via INTRAVENOUS
  Administered 2022-12-28: 50 mg via INTRAVENOUS

## 2022-12-28 MED ORDER — EPHEDRINE SULFATE-NACL 50-0.9 MG/10ML-% IV SOSY
PREFILLED_SYRINGE | INTRAVENOUS | Status: DC | PRN
  Administered 2022-12-28 (×2): 5 mg via INTRAVENOUS

## 2022-12-28 MED ORDER — MIDAZOLAM HCL 2 MG/2ML IJ SOLN
INTRAMUSCULAR | Status: AC
  Filled 2022-12-28: qty 2

## 2022-12-28 MED ORDER — CHLORHEXIDINE GLUCONATE 0.12 % MT SOLN
15.0000 mL | Freq: Once | OROMUCOSAL | Status: AC
  Administered 2022-12-28: 15 mL via OROMUCOSAL
  Filled 2022-12-28: qty 15

## 2022-12-28 MED ORDER — CARBOXYMETHYLCELLULOSE SOD PF 0.5 % OP SOLN: 1.0000 [drp] | Freq: Three times a day (TID) | OPHTHALMIC | Status: DC

## 2022-12-28 MED ORDER — ROCURONIUM BROMIDE 10 MG/ML (PF) SYRINGE
PREFILLED_SYRINGE | INTRAVENOUS | Status: AC
  Filled 2022-12-28: qty 10

## 2022-12-28 MED ORDER — CEFAZOLIN SODIUM-DEXTROSE 1-4 GM/50ML-% IV SOLN
1.0000 g | Freq: Three times a day (TID) | INTRAVENOUS | Status: AC
  Administered 2022-12-28 (×2): 1 g via INTRAVENOUS
  Filled 2022-12-28 (×2): qty 50

## 2022-12-28 MED ORDER — PROPOFOL 10 MG/ML IV BOLUS
INTRAVENOUS | Status: AC
  Filled 2022-12-28: qty 20

## 2022-12-28 MED ORDER — INSULIN ASPART 100 UNIT/ML IJ SOLN
0.0000 [IU] | Freq: Three times a day (TID) | INTRAMUSCULAR | Status: DC
  Administered 2022-12-28: 5 [IU] via SUBCUTANEOUS

## 2022-12-28 MED ORDER — SODIUM CHLORIDE 0.9 % IV SOLN
250.0000 mL | INTRAVENOUS | Status: DC
  Administered 2022-12-28: 250 mL via INTRAVENOUS

## 2022-12-28 MED ORDER — SUGAMMADEX SODIUM 200 MG/2ML IV SOLN
INTRAVENOUS | Status: DC | PRN
  Administered 2022-12-28: 200 mg via INTRAVENOUS

## 2022-12-28 MED ORDER — MIDAZOLAM HCL 2 MG/2ML IJ SOLN
INTRAMUSCULAR | Status: DC | PRN
  Administered 2022-12-28: 2 mg via INTRAVENOUS

## 2022-12-28 MED ORDER — PHENYLEPHRINE HCL-NACL 20-0.9 MG/250ML-% IV SOLN
INTRAVENOUS | Status: DC | PRN
  Administered 2022-12-28: 40 ug/min via INTRAVENOUS

## 2022-12-28 MED ORDER — LACTATED RINGERS IV SOLN: INTRAVENOUS | Status: DC | PRN

## 2022-12-28 MED ORDER — FENTANYL CITRATE (PF) 250 MCG/5ML IJ SOLN
INTRAMUSCULAR | Status: AC
  Filled 2022-12-28: qty 5

## 2022-12-28 MED ORDER — HYDROMORPHONE HCL 1 MG/ML IJ SOLN
1.0000 mg | INTRAMUSCULAR | Status: DC | PRN
  Administered 2022-12-28: 1 mg via INTRAVENOUS
  Filled 2022-12-28: qty 1

## 2022-12-28 MED ORDER — FENTANYL CITRATE (PF) 100 MCG/2ML IJ SOLN
INTRAMUSCULAR | Status: AC
  Filled 2022-12-28: qty 2

## 2022-12-28 MED ORDER — ACETAMINOPHEN 650 MG RE SUPP: 650.0000 mg | RECTAL | Status: DC | PRN

## 2022-12-28 MED ORDER — ISOSORBIDE MONONITRATE ER 30 MG PO TB24: 15.0000 mg | ORAL_TABLET | ORAL | Status: DC

## 2022-12-28 MED ORDER — ASPIRIN 81 MG PO TBEC
81.0000 mg | DELAYED_RELEASE_TABLET | Freq: Every day | ORAL | Status: DC
  Administered 2022-12-28: 81 mg via ORAL
  Filled 2022-12-28: qty 1

## 2022-12-28 MED ORDER — GLYCOPYRROLATE PF 0.2 MG/ML IJ SOSY
PREFILLED_SYRINGE | INTRAMUSCULAR | Status: DC | PRN
  Administered 2022-12-28: .2 mg via INTRAVENOUS

## 2022-12-28 MED ORDER — FENTANYL CITRATE (PF) 250 MCG/5ML IJ SOLN
INTRAMUSCULAR | Status: DC | PRN
  Administered 2022-12-28 (×3): 50 ug via INTRAVENOUS
  Administered 2022-12-28: 100 ug via INTRAVENOUS

## 2022-12-28 MED ORDER — THROMBIN 20000 UNITS EX SOLR
CUTANEOUS | Status: AC
  Filled 2022-12-28: qty 20000

## 2022-12-28 MED ORDER — ACETAMINOPHEN 325 MG PO TABS: 650.0000 mg | ORAL_TABLET | ORAL | Status: DC | PRN

## 2022-12-28 MED ORDER — CHLORHEXIDINE GLUCONATE CLOTH 2 % EX PADS: 6.0000 | MEDICATED_PAD | Freq: Once | CUTANEOUS | Status: DC

## 2022-12-28 MED ORDER — DEXAMETHASONE SODIUM PHOSPHATE 10 MG/ML IJ SOLN
INTRAMUSCULAR | Status: AC
  Filled 2022-12-28: qty 1

## 2022-12-28 MED ORDER — ORAL CARE MOUTH RINSE: 15.0000 mL | Freq: Once | OROMUCOSAL | Status: AC

## 2022-12-28 MED ORDER — KETOTIFEN FUMARATE 0.035 % OP SOLN
1.0000 [drp] | Freq: Two times a day (BID) | OPHTHALMIC | Status: DC
  Administered 2022-12-28: 1 [drp] via OPHTHALMIC
  Filled 2022-12-28: qty 5

## 2022-12-28 MED ORDER — AMLODIPINE BESYLATE 5 MG PO TABS
12.5000 mg | ORAL_TABLET | Freq: Every day | ORAL | Status: DC
  Administered 2022-12-28: 12.5 mg via ORAL
  Filled 2022-12-28: qty 1

## 2022-12-28 MED ORDER — ONDANSETRON HCL 4 MG/2ML IJ SOLN: 4.0000 mg | Freq: Four times a day (QID) | INTRAMUSCULAR | Status: DC | PRN

## 2022-12-28 MED ORDER — MENTHOL 3 MG MT LOZG
1.0000 | LOZENGE | OROMUCOSAL | Status: DC | PRN
  Administered 2022-12-29: 3 mg via ORAL
  Filled 2022-12-28: qty 9

## 2022-12-28 MED ORDER — SIMVASTATIN 20 MG PO TABS
20.0000 mg | ORAL_TABLET | Freq: Every day | ORAL | Status: DC
  Administered 2022-12-28: 20 mg via ORAL
  Filled 2022-12-28: qty 1

## 2022-12-28 MED ORDER — ACETAMINOPHEN 500 MG PO TABS
1000.0000 mg | ORAL_TABLET | Freq: Once | ORAL | Status: AC
  Administered 2022-12-28: 1000 mg via ORAL
  Filled 2022-12-28: qty 2

## 2022-12-28 MED ORDER — ISOSORBIDE MONONITRATE ER 30 MG PO TB24
15.0000 mg | ORAL_TABLET | Freq: Every day | ORAL | Status: DC
  Filled 2022-12-28: qty 1

## 2022-12-28 MED ORDER — HYDROCODONE-ACETAMINOPHEN 5-325 MG PO TABS
1.0000 | ORAL_TABLET | ORAL | Status: DC | PRN
  Administered 2022-12-28: 1 via ORAL

## 2022-12-28 MED ORDER — SODIUM CHLORIDE 0.9% FLUSH
3.0000 mL | Freq: Two times a day (BID) | INTRAVENOUS | Status: DC
  Administered 2022-12-28 (×2): 3 mL via INTRAVENOUS

## 2022-12-28 MED ORDER — CYCLOBENZAPRINE HCL 10 MG PO TABS
ORAL_TABLET | ORAL | Status: AC
  Filled 2022-12-28: qty 1

## 2022-12-28 MED ORDER — THROMBIN 5000 UNITS EX SOLR
CUTANEOUS | Status: AC
  Filled 2022-12-28: qty 5000

## 2022-12-28 MED ORDER — DEXMEDETOMIDINE HCL IN NACL 80 MCG/20ML IV SOLN
INTRAVENOUS | Status: DC | PRN
  Administered 2022-12-28: 4 ug via BUCCAL
  Administered 2022-12-28: 8 ug via BUCCAL

## 2022-12-28 MED ORDER — PRIMIDONE 50 MG PO TABS
75.0000 mg | ORAL_TABLET | Freq: Three times a day (TID) | ORAL | Status: DC
  Administered 2022-12-28 (×2): 75 mg via ORAL
  Filled 2022-12-28 (×2): qty 2

## 2022-12-28 MED ORDER — LORATADINE 10 MG PO TABS
10.0000 mg | ORAL_TABLET | Freq: Every day | ORAL | Status: DC
  Administered 2022-12-28: 10 mg via ORAL
  Filled 2022-12-28: qty 1

## 2022-12-28 MED ORDER — FENTANYL CITRATE (PF) 100 MCG/2ML IJ SOLN
25.0000 ug | INTRAMUSCULAR | Status: DC | PRN
  Administered 2022-12-28 (×4): 25 ug via INTRAVENOUS

## 2022-12-28 MED ORDER — LIDOCAINE 2% (20 MG/ML) 5 ML SYRINGE
INTRAMUSCULAR | Status: DC | PRN
  Administered 2022-12-28: 60 mg via INTRAVENOUS

## 2022-12-28 MED ORDER — LACTATED RINGERS IV SOLN: INTRAVENOUS | Status: DC

## 2022-12-28 MED ORDER — 0.9 % SODIUM CHLORIDE (POUR BTL) OPTIME
TOPICAL | Status: DC | PRN
  Administered 2022-12-28: 1000 mL

## 2022-12-28 MED ORDER — METFORMIN HCL 500 MG PO TABS: 250.0000 mg | ORAL_TABLET | Freq: Every day | ORAL | Status: DC

## 2022-12-28 SURGICAL SUPPLY — 58 items
APL SKNCLS STERI-STRIP NONHPOA (GAUZE/BANDAGES/DRESSINGS) ×1
BAG COUNTER SPONGE SURGICOUNT (BAG) ×1 IMPLANT
BAG DECANTER FOR FLEXI CONT (MISCELLANEOUS) ×1 IMPLANT
BAG SPNG CNTER NS LX DISP (BAG) ×1
BAND INSRT 18 STRL LF DISP RB (MISCELLANEOUS) ×2
BAND RUBBER #18 3X1/16 STRL (MISCELLANEOUS) ×4 IMPLANT
BENZOIN TINCTURE PRP APPL 2/3 (GAUZE/BANDAGES/DRESSINGS) ×1 IMPLANT
BIT DRILL 13 (BIT) IMPLANT
BUR MATCHSTICK NEURO 3.0 LAGG (BURR) ×1 IMPLANT
CAGE PEEK 6X14X11 (Cage) ×2 IMPLANT
CAGE PEEK 7X14X11 (Cage) ×2 IMPLANT
CANISTER SUCT 3000ML PPV (MISCELLANEOUS) ×1 IMPLANT
DRAPE C-ARM 42X72 X-RAY (DRAPES) ×2 IMPLANT
DRAPE LAPAROTOMY 100X72 PEDS (DRAPES) ×1 IMPLANT
DRAPE MICROSCOPE SLANT 54X150 (MISCELLANEOUS) ×1 IMPLANT
DURAPREP 6ML APPLICATOR 50/CS (WOUND CARE) ×1 IMPLANT
ELECT COATED BLADE 2.86 ST (ELECTRODE) ×1 IMPLANT
ELECT REM PT RETURN 9FT ADLT (ELECTROSURGICAL) ×1
ELECTRODE REM PT RTRN 9FT ADLT (ELECTROSURGICAL) ×1 IMPLANT
GAUZE 4X4 16PLY ~~LOC~~+RFID DBL (SPONGE) IMPLANT
GAUZE SPONGE 4X4 12PLY STRL (GAUZE/BANDAGES/DRESSINGS) ×1 IMPLANT
GLOVE BIO SURGEON STRL SZ 6.5 (GLOVE) ×1 IMPLANT
GLOVE BIOGEL PI IND STRL 6.5 (GLOVE) ×1 IMPLANT
GLOVE ECLIPSE 9.0 STRL (GLOVE) ×1 IMPLANT
GLOVE EXAM NITRILE XL STR (GLOVE) IMPLANT
GOWN STRL REUS W/ TWL LRG LVL3 (GOWN DISPOSABLE) IMPLANT
GOWN STRL REUS W/ TWL XL LVL3 (GOWN DISPOSABLE) IMPLANT
GOWN STRL REUS W/TWL 2XL LVL3 (GOWN DISPOSABLE) IMPLANT
GOWN STRL REUS W/TWL LRG LVL3 (GOWN DISPOSABLE)
GOWN STRL REUS W/TWL XL LVL3 (GOWN DISPOSABLE)
HALTER HD/CHIN CERV TRACTION D (MISCELLANEOUS) ×1 IMPLANT
KIT BASIN OR (CUSTOM PROCEDURE TRAY) ×2 IMPLANT
KIT TURNOVER KIT B (KITS) ×1 IMPLANT
NDL SPNL 20GX3.5 QUINCKE YW (NEEDLE) ×2 IMPLANT
NEEDLE SPNL 20GX3.5 QUINCKE YW (NEEDLE) ×1 IMPLANT
NS IRRIG 1000ML POUR BTL (IV SOLUTION) ×1 IMPLANT
PACK LAMINECTOMY NEURO (CUSTOM PROCEDURE TRAY) ×1 IMPLANT
PAD ARMBOARD 7.5X6 YLW CONV (MISCELLANEOUS) ×6 IMPLANT
PLATE 4 75XNS SPNE CVD ANT T (Plate) IMPLANT
PLATE 4 ATLANTIS TRANS (Plate) ×2 IMPLANT
SCREW ST 13X4XST VA NS SPNE (Screw) IMPLANT
SCREW ST FIX 4 ATL 3120213 (Screw) IMPLANT
SCREW ST VAR 4 ATL (Screw) ×2 IMPLANT
SPACER SPNL 11X14X6XPEEK CVD (Cage) IMPLANT
SPACER SPNL 11X14X7XPEEK CVD (Cage) IMPLANT
SPCR SPNL 11X14X6XPEEK CVD (Cage) ×2 IMPLANT
SPCR SPNL 11X14X7XPEEK CVD (Cage) ×2 IMPLANT
SPONGE INTESTINAL PEANUT (DISPOSABLE) ×1 IMPLANT
SPONGE SURGIFOAM ABS GEL 100 (HEMOSTASIS) ×1 IMPLANT
STRIP CLOSURE SKIN 1/2X4 (GAUZE/BANDAGES/DRESSINGS) ×1 IMPLANT
SUT VIC AB 3-0 SH 8-18 (SUTURE) ×1 IMPLANT
SUT VIC AB 4-0 RB1 18 (SUTURE) ×1 IMPLANT
TAPE CLOTH 4X10 WHT NS (GAUZE/BANDAGES/DRESSINGS) ×1 IMPLANT
TAPE CLOTH SURG 4X10 WHT LF (GAUZE/BANDAGES/DRESSINGS) IMPLANT
TOWEL GREEN STERILE (TOWEL DISPOSABLE) ×1 IMPLANT
TOWEL GREEN STERILE FF (TOWEL DISPOSABLE) ×1 IMPLANT
TRAP SPECIMEN MUCUS 40CC (MISCELLANEOUS) ×1 IMPLANT
WATER STERILE IRR 1000ML POUR (IV SOLUTION) ×2 IMPLANT

## 2022-12-28 NOTE — Progress Notes (Signed)
Orthopedic Tech Progress Note Patient Details:  Troy Little March 15, 1951 [id number]  Ortho Devices Type of Ortho Device: Soft collar Ortho Device/Splint Location: NECK Ortho Device/Splint Interventions: Ordered, Application, Adjustment   Post Interventions Patient Tolerated: Well Instructions Provided: Care of device  [name] 12/28/2022, 1:55 PM

## 2022-12-28 NOTE — H&P (Signed)
Troy Little is an 72 y.o. male.   Chief Complaint: Neck pain HPI: Patient is a 72 year old male with chronic and persistently worsening neck pain with radiating numbness and paresthesias in the both upper extremities.  He is noting some increased incoordination in his hands with clumsiness of his fingers.  He is noting some gait instability.  Workup demonstrates evidence of significant multilevel cervical spondylosis with moderately severe spinal stenosis with cord compression.  Patient presents now for 4 level anterior cervical decompression and fusion surgery.  Past Medical History:  Diagnosis Date   Agoraphobia    Anxiety    Arthritis    Coronary artery disease    1 stent   Diabetes mellitus without complication    Essential Tremor    GERD (gastroesophageal reflux disease)    Headache    Hepatitis 2012   pt states he had Hep C but was treated   History of hiatal hernia    Hypercholesteremia    Hypertension    MDD (major depressive disorder)    Psychosis    Stroke    "series of little mini strokes" per pt    Past Surgical History:  Procedure Laterality Date   CARDIAC CATHETERIZATION     CHOLECYSTECTOMY     CORONARY STENT INTERVENTION N/A 09/24/2019   Procedure: CORONARY STENT INTERVENTION;  Surgeon: Corky CraftsVaranasi, Jayadeep S, MD;  Location: [facility]AS[facility];  Service: Cardiovascular;  Laterality: N/A;   LEFT HEART CATH N/A 09/24/2019   Procedure: Left Heart Cath;  Surgeon: Corky CraftsVaranasi, Jayadeep S, MD;  [facility];  Service: Cardiovascular;  Laterality: N/A;    Family History  Problem Relation Age of Onset   Cancer Mother    Social History:  reports that he has been smoking cigarettes. He has a 23.50 pack-year smoking history. He has never used smokeless tobacco. He reports that he does not currently use alcohol. He reports that he does not currently use drugs.  Allergies:  Allergies  Allergen Reactions   Prozac [Fluoxetine Hcl] Other (See Comments)     agitation   Dye Fdc Red [Red Dye] Rash    Medications Prior to Admission  Medication Sig Dispense Refill   amLODipine (NORVASC) 5 MG tablet Take 2.5 tablets by mouth daily.     aspirin EC 81 MG tablet Take 81 mg by mouth daily.     Carboxymethylcellulose Sod PF (REFRESH PLUS) 0.5 % SOLN Place 1 drop into both eyes in the morning, at noon, and at bedtime.     cetirizine (ZYRTEC) 10 MG tablet TAKE 1 TABLET EVERY DAY 90 tablet 1   diphenhydramine-acetaminophen (TYLENOL PM) 25-500 MG TABS tablet Take 1 tablet by mouth 2 (two) times daily as needed (pain/sleep).     isosorbide mononitrate (IMDUR) 30 MG 24 hr tablet Take 15 mg by mouth See admin instructions. Take 15 mg daily, may take a second 15 mg dose at night as needed for high blood pressure     ketotifen (ZADITOR) 0.035 % ophthalmic solution Place 1 drop into both eyes in the morning and at bedtime.     lisinopril (ZESTRIL) 40 MG tablet Take 1 tablet (40 mg total) by mouth daily. Overdue for Annual appt must see provider for future refills (Patient taking differently: Take 20 mg by mouth daily. Overdue for Annual appt must see provider for future refills) 30 tablet 0   metFORMIN (GLUCOPHAGE) 500 MG tablet Take 1 tablet (500 mg total) by mouth daily with breakfast. Per patient,  he takes 1/4 tablet per day "when he needs it" (Patient taking differently: Take 250-500 mg by mouth daily with breakfast.)     Omega-3 Fatty Acids (FISH OIL) 1000 MG CAPS Take 2,000 mg by mouth 2 (two) times daily.     pantoprazole (PROTONIX) 40 MG tablet Take 40 mg by mouth 2 (two) times daily.     primidone (MYSOLINE) 50 MG tablet Take 1 tablet (50 mg total) by mouth 2 (two) times daily. Per patient, takes  1 1/2 tablet in the am and 1 tablet in the afternoon (Patient taking differently: Take 75 mg by mouth 3 (three) times daily.)     propranolol (INDERAL) 10 MG tablet Patient takes 1 1/2 tablet in the am, 1 in the afternoon, 1 in the evening (Patient taking differently:  Take 15 mg by mouth 3 (three) times daily.)     simvastatin (ZOCOR) 20 MG tablet Take 20 mg by mouth at bedtime.      terbinafine (LAMISIL) 1 % cream Apply 1 Application topically 2 (two) times daily.     triamcinolone cream (KENALOG) 0.1 % Apply 1 application topically 2 (two) times daily. 100 g 2   Alcohol Swabs (ALCOHOL PREP PAD) 70 % PADS USE PAD TO AFFECTED AREA AS DIRECTED     Alcohol Swabs (DROPSAFE ALCOHOL PREP) 70 % PADS APPLY TO AFFECTED AREA AS DIRECTED 200 each 3   azelastine (OPTIVAR) 0.05 % ophthalmic solution Place 1 drop into both eyes 2 (two) times daily. (Patient not taking: Reported on 12/10/2022) 6 mL 3   blood glucose meter kit and supplies Dispense based on patient and insurance preference. Use to test in the morning, noon, and bedtime as directed. (FOR ICD-10 E10.9, E11.9). 1 each 0   famotidine (PEPCID) 20 MG tablet TAKE 1 TABLET TWICE DAILY (Patient not taking: Reported on 12/10/2022) 180 tablet 0   Glucose 15 GM/32ML GEL TAKE 1 TUBE BY MOUTH AS DIRECTED BY YOUR PROVIDER AS NEEDED LOW GLUCOSE     TRUE METRIX BLOOD GLUCOSE TEST test strip USE AS DIRECTED 100 strip 3   TRUEplus Lancets 33G MISC USE PER INSTRUCTIONS. 100 each 3    Results for orders placed or performed during the hospital encounter of 12/28/22 (from the past 48 hour(s))  Glucose, capillary     Status: Abnormal   Collection Time: 12/28/22  6:17 AM  Result Value Ref Range   Glucose-Capillary 119 (H) 70 - 99 mg/dL    Comment: Glucose reference range applies only to samples taken after fasting for at least 8 hours.  ABO/Rh     Status: None   Collection Time: 12/28/22  6:46 AM  Result Value Ref Range   ABO/RH(D)      B POS Performed at [facility], [address] [place], [place] [address]    No results found.  Pertinent items noted in HPI and remainder of comprehensive ROS otherwise negative.  Blood pressure 119/68, pulse (!) 54, temperature 98.2 F (36.8 C), temperature source Oral, resp.  rate 17, height 5\' 8"  (1.727 m), weight 68 kg, SpO2 98 %.  Patient is awake and alert.  He is oriented and appropriate.  Speech is fluent.  Judgment insight are intact.  Cranial nerve function normal bilateral.  Motor examination reveals some mild weakness of grip and intrinsic strength bilaterally.  Otherwise motor strength intact.  Sensory examination with patchy distal sensory loss in both upper extremities.  Reflexes are brisk.  Hoffmann's responses in both hands.  Gait somewhat spastic and uncoordinated.  Examination head ears eyes nose and throat is unremarked.  Chest and abdomen are benign.  Extremities are free from injury or deformity. Assessment/Plan Cervical spondylosis with stenosis and myelopathy.  Plan C3-4, C4-5, C5-6 and C6-7 anterior cervical discectomy and fusion.  Risks and benefits of been explained.  Patient wishes to proceed.  Sherilyn Cooter A Lessa Huge 12/28/2022, 7:58 AM

## 2022-12-28 NOTE — Brief Op Note (Signed)
12/28/2022  11:16 AM  PATIENT:  Troy Little  72 y.o. male  PRE-OPERATIVE DIAGNOSIS:  spondylosis with myelopathy  POST-OPERATIVE DIAGNOSIS:  spondylosis with myelopathy  PROCEDURE:  Procedure(s): CERVICAL THREE-FOUR, CERVICAL FOUR-FIVE, CERVICAL FIVE-SIX, CERVICAL SIX-SEVEN ANTERIOR CERVICAL DECOMPRESSION/DISCECTOMY FUSION (N/A)  SURGEON:  Surgeon(s) and Role:    * [name], MD - Primary  PHYSICIAN ASSISTANT:   ASSISTANTSMarland Mcalpine   ANESTHESIA:   general  EBL:  200   BLOOD ADMINISTERED:none  DRAINS: (med) Hemovact drain(s) in the prevertebral space with  Suction Open   LOCAL MEDICATIONS USED:  NONE  SPECIMEN:  No Specimen  DISPOSITION OF SPECIMEN:  N/A  COUNTS:  YES  TOURNIQUET:  * No tourniquets in log *  DICTATION: .Dragon Dictation  PLAN OF CARE: Admit to inpatient   PATIENT DISPOSITION:  PACU - hemodynamically stable.   Delay start of Pharmacological VTE agent (>24hrs) due to surgical blood loss or risk of bleeding: yes

## 2022-12-28 NOTE — Op Note (Signed)
Date of procedure: 12/28/2022  Date of dictation: Same  Service: Neurosurgery  Preoperative diagnosis: Cervical spondylosis with myelopathy and radiculopathy  Postoperative diagnosis: Same  Procedure Name: C3-4, C4-5, C5-6, C6-7 anterior cervical discectomy with interbody fusion utilizing interbody cages, local harvested autograft, and anterior plate instrumentation  Surgeon:Abdulahi Schor A.Janyce Ellinger, M.D.  Asst. Surgeon: Doran Durand, NP  Assistant utilized for exposure, decompression, fusion, instrumentation.  And closure  Anesthesia: General  Indication: 72 year old male with neck and upper extremity symptoms failing conservative manage.  Workup demonstrates evidence of severe multilevel cervical spondylosis with associated stenosis both centrally and neuroforaminal he.  Patient has failed conservative management presents now for 4 level anterior cervical discectomy and fusion.  Operative note: After induction of anesthesia, patient positioned supine with neck slightly extended and held placeholder traction.  Patient's anterior cervical region prepped and draped sterilely.  Incision made overlying C5.  Dissection performed on the right.  Retractor placed.  Fluoroscopy used.  Levels confirmed.  Disc spaces at C3-4, C4-5, C5-6 and C6-7 were incised.  Anterior osteophytes were removed.  Discectomy was then performed using various instruments down to level the posterior logical ligament.  Microscope send brought to the field used throughout remainder of the discectomy.  Remaining aspects of annulus and osteophytes were removed using a high-speed drill down to level the posterior logical limb.  Posterior logical was elevated and resected using Kerrison rongeurs.  Underlying thecal sac was identified.  Wide central decompression then performed undercutting the bodies of C3 and C4.  Decompression then proceeded to each neural foramina.  Wide anterior foraminotomies were performed along the course exiting C4 nerve  roots bilaterally.  At this point a very thorough decompression had been achieved.  There was no evidence of injury to the thecal sac and nerve roots.  Procedures then repeated at C4-5, C5-6 and C6-7 again without complications and again with good appearance of the decompressions.  Wound was irrigated.  Gelfoam was placed topically for hemostasis then removed.  [employer] anatomic peek cages were then packed with locally harvested autograft.  Each cage was then impacted in the place and recessed slightly from the anterior cortical margin.  Atlantis translational plate was then placed over the C3, C4, C5, C6 and C7 levels.  This then attached under fluoroscopic guidance using 13 mm screws.  All screws given final tightening found to be solidly within the bone.  Locking screws engaged at all levels.  Final images revealed good position cages and the hardware proper operative level with normal alignment of spine.  Wound was inspected for hemostasis.  Wound was then irrigated.  Medium Hemovac drain was left in the prevertebral space.  Wound was then closed in layers with Vicryl sutures.  Steri-Strips and sterile dressing were applied.  No apparent complications.  Patient tolerated the procedure and he returns to the recovery room postop.

## 2022-12-28 NOTE — Transfer of Care (Signed)
Immediate Anesthesia Transfer of Care Note  Patient: Troy Little  Procedure(s) Performed: CERVICAL THREE-FOUR, CERVICAL FOUR-FIVE, CERVICAL FIVE-SIX, CERVICAL SIX-SEVEN ANTERIOR CERVICAL DECOMPRESSION/DISCECTOMY FUSION (Spine Cervical)  Patient Location: PACU  Anesthesia Type:General  Level of Consciousness: drowsy, patient cooperative, and responds to stimulation  Airway & Oxygen Therapy: Patient Spontanous Breathing and Patient connected to face mask oxygen  Post-op Assessment: Report given to RN, Post -op Vital signs reviewed and stable, and Patient moving all extremities X 4  Post vital signs: Reviewed and stable  Last Vitals:  Vitals Value Taken Time  BP 159/78 12/28/22 1133  Temp    Pulse 70 12/28/22 1137  Resp 18 12/28/22 1137  SpO2 100 % 12/28/22 1137  Vitals shown include unvalidated device data.  Last Pain:  Vitals:   12/28/22 0624  TempSrc:   PainSc: 7          Complications: No notable events documented.

## 2022-12-28 NOTE — Anesthesia Procedure Notes (Signed)
Procedure Name: Intubation Date/Time: 12/28/2022 8:32 AM  Performed by: Shary Decamp, CRNAPre-anesthesia Checklist: Patient identified, Patient being monitored, Timeout performed, Emergency Drugs available and Suction available Patient Re-evaluated:Patient Re-evaluated prior to induction Oxygen Delivery Method: Circle system utilized Preoxygenation: Pre-oxygenation with 100% oxygen Induction Type: IV induction Ventilation: Mask ventilation without difficulty Laryngoscope Size: Mac, Glidescope and 4 Grade View: Grade I Tube type: Oral Tube size: 7.5 mm Number of attempts: 1 Airway Equipment and Method: Rigid stylet and Video-laryngoscopy Placement Confirmation: ETT inserted through vocal cords under direct vision, positive ETCO2 and breath sounds checked- equal and bilateral Secured at: 22 cm Tube secured with: Tape Dental Injury: Teeth and Oropharynx as per pre-operative assessment  Difficulty Due To: Difficult Airway- due to reduced neck mobility

## 2022-12-28 NOTE — Anesthesia Postprocedure Evaluation (Signed)
Anesthesia Post Note  Patient: Troy Little  Procedure(s) Performed: CERVICAL THREE-FOUR, CERVICAL FOUR-FIVE, CERVICAL FIVE-SIX, CERVICAL SIX-SEVEN ANTERIOR CERVICAL DECOMPRESSION/DISCECTOMY FUSION (Spine Cervical)     Patient location during evaluation: PACU Anesthesia Type: General Level of consciousness: awake and alert Pain management: pain level controlled Vital Signs Assessment: post-procedure vital signs reviewed and stable Respiratory status: spontaneous breathing, nonlabored ventilation and respiratory function stable Cardiovascular status: blood pressure returned to baseline and stable Postop Assessment: no apparent nausea or vomiting Anesthetic complications: no   No notable events documented.  Last Vitals:  Vitals:   12/28/22 1300 12/28/22 1324  BP: (!) 147/69 (!) 172/76  Pulse: (!) 56 (!) 54  Resp: 14 16  Temp:  36.5 C  SpO2: 95% 99%    Last Pain:  Vitals:   12/28/22 1324  TempSrc: Oral  PainSc:    Pain Goal:                   Lowella Curb

## 2022-12-29 ENCOUNTER — Encounter (HOSPITAL_COMMUNITY): Payer: Self-pay | Admitting: Neurosurgery

## 2022-12-29 LAB — GLUCOSE, CAPILLARY
Glucose-Capillary: 155 mg/dL — ABNORMAL HIGH (ref 70–99)
Glucose-Capillary: 106 mg/dL — ABNORMAL HIGH (ref 70–99)

## 2022-12-29 MED ORDER — HYDROCODONE-ACETAMINOPHEN 5-325 MG PO TABS: 1.0000 | ORAL_TABLET | ORAL | 0 refills | Status: DC | PRN

## 2022-12-29 MED ORDER — CYCLOBENZAPRINE HCL 10 MG PO TABS: 10.0000 mg | ORAL_TABLET | Freq: Three times a day (TID) | ORAL | 0 refills | Status: DC | PRN

## 2022-12-29 NOTE — Discharge Instructions (Addendum)
Wound Care Keep incision covered and dry for three days.   Do not put any creams, lotions, or ointments on incision. Leave steri-strips on back.  They will fall off by themselves. Activity Walk each and every day, increasing distance each day. No lifting greater than 5 lbs.  Avoid excessive neck motion. No driving for 2 weeks; may ride as a passenger locally. If provided with back brace, wear when out of bed.  It is not necessary to wear brace in bed. Diet Resume your normal diet.  Return to Work Will be discussed at you follow up appointment. Call Your Doctor If Any of These Occur Redness, drainage, or swelling at the wound.  Temperature greater than 101 degrees. Severe pain not relieved by pain medication. Incision starts to come apart. Follow Up Appt Call [phone]) for problems.  If you have any hardware placed in your spine, you will need an x-ray before your appointment.  

## 2022-12-29 NOTE — Discharge Summary (Signed)
Physician Discharge Summary  Patient ID: Troy Little MRN: [id number] DOB/AGE: 1951-07-17 72 y.o.  Admit date: 12/28/2022 Discharge date: 12/29/2022  Admission Diagnoses:  Discharge Diagnoses:  Principal Problem:   Cervical spondylosis with myelopathy and radiculopathy   Discharged Condition: good  Hospital Course: Patient admitted to the hospital where he underwent uncomplicated 4 level anterior cervical decompression and fusion.  Postoperatively doing very well.  Preoperative neck pain and upper extremity symptoms resolved.  Standing ambulating and voiding without any difficulty.  Patient ready for discharge home.  Consults:   Significant Diagnostic Studies:   Treatments:   Discharge Exam: Blood pressure 128/62, pulse (!) 55, temperature 98.7 F (37.1 C), temperature source Oral, resp. rate 16, height 5\' 8"  (1.727 m), weight 68 kg, SpO2 97 %. Awake and alert.  Oriented and appropriate.  Speech fluent.  Judgment insight intact.  Cranial nerve function normal bilateral.  Motor examination 5/5 bilaterally.  Wound clean and dry.  Chest and abdomen benign. Disposition: Discharge disposition: 01-Home or Self Care        Allergies as of 12/29/2022       Reactions   Prozac [fluoxetine Hcl] Other (See Comments)   agitation   Dye Fdc Red [red Dye] Rash        Medication List     TAKE these medications    amLODipine 5 MG tablet Commonly known as: NORVASC Take 2.5 tablets by mouth daily.   aspirin EC 81 MG tablet Take 81 mg by mouth daily.   azelastine 0.05 % ophthalmic solution Commonly known as: OPTIVAR Place 1 drop into both eyes 2 (two) times daily.   blood glucose meter kit and supplies Dispense based on patient and insurance preference. Use to test in the morning, noon, and bedtime as directed. (FOR ICD-10 E10.9, E11.9).   cetirizine 10 MG tablet Commonly known as: ZYRTEC TAKE 1 TABLET EVERY DAY   cyclobenzaprine 10 MG tablet Commonly known as:  FLEXERIL Take 1 tablet (10 mg total) by mouth 3 (three) times daily as needed for muscle spasms.   diphenhydramine-acetaminophen 25-500 MG Tabs tablet Commonly known as: TYLENOL PM Take 1 tablet by mouth 2 (two) times daily as needed (pain/sleep).   DropSafe Alcohol Prep 70 % Pads APPLY TO AFFECTED AREA AS DIRECTED   Alcohol Prep Pad 70 % Pads USE PAD TO AFFECTED AREA AS DIRECTED   famotidine 20 MG tablet Commonly known as: PEPCID TAKE 1 TABLET TWICE DAILY   Fish Oil 1000 MG Caps Take 2,000 mg by mouth 2 (two) times daily.   Glucose 15 GM/32ML Gel TAKE 1 TUBE BY MOUTH AS DIRECTED BY YOUR PROVIDER AS NEEDED LOW GLUCOSE   HYDROcodone-acetaminophen 5-325 MG tablet Commonly known as: NORCO/VICODIN Take 1 tablet by mouth every 4 (four) hours as needed for moderate pain ((score 4 to 6)).   isosorbide mononitrate 30 MG 24 hr tablet Commonly known as: IMDUR Take 15 mg by mouth See admin instructions. Take 15 mg daily, may take a second 15 mg dose at night as needed for high blood pressure   ketotifen 0.035 % ophthalmic solution Commonly known as: ZADITOR Place 1 drop into both eyes in the morning and at bedtime.   lisinopril 40 MG tablet Commonly known as: ZESTRIL Take 1 tablet (40 mg total) by mouth daily. Overdue for Annual appt must see provider for future refills What changed: how much to take   metFORMIN 500 MG tablet Commonly known as: GLUCOPHAGE Take 1 tablet (500 mg total) by mouth  daily with breakfast. Per patient, he takes 1/4 tablet per day "when he needs it" What changed:  how much to take additional instructions   pantoprazole 40 MG tablet Commonly known as: PROTONIX Take 40 mg by mouth 2 (two) times daily.   primidone 50 MG tablet Commonly known as: MYSOLINE Take 1 tablet (50 mg total) by mouth 2 (two) times daily. Per patient, takes  1 1/2 tablet in the am and 1 tablet in the afternoon What changed:  how much to take when to take this additional  instructions   propranolol 10 MG tablet Commonly known as: INDERAL Patient takes 1 1/2 tablet in the am, 1 in the afternoon, 1 in the evening What changed:  how much to take how to take this when to take this additional instructions   Refresh Plus 0.5 % Soln Generic drug: Carboxymethylcellulose Sod PF Place 1 drop into both eyes in the morning, at noon, and at bedtime.   simvastatin 20 MG tablet Commonly known as: ZOCOR Take 20 mg by mouth at bedtime.   terbinafine 1 % cream Commonly known as: LAMISIL Apply 1 Application topically 2 (two) times daily.   triamcinolone cream 0.1 % Commonly known as: KENALOG Apply 1 application topically 2 (two) times daily.   True Metrix Blood Glucose Test test strip Generic drug: glucose blood USE AS DIRECTED   TRUEplus Lancets 33G Misc USE PER INSTRUCTIONS.         Signed: Kathaleen Maser Brooklee Michelin 12/29/2022, 8:22 AM

## 2022-12-29 NOTE — Evaluation (Signed)
Occupational Therapy Evaluation Patient Details Name: Troy Little MRN: [id number] DOB: [birth year] Today's Date: 12/29/2022   History of Present Illness 72 y/o male s/p C3-4, C4-5, C5-6, C6-7 fusion due to cervical spondylosis with myelopathy and radiculopathy.   Clinical Impression   Patient evaluated by Occupational Therapy with no further acute OT needs identified. All education has been completed and the patient has no further questions. Prior to surgery, pt was able to complete all ADL tasks and functional mobility without assistance. No follow up Occupational Therapy or equipment needs. OT is signing off. Thank you for this referral.       Recommendations for follow up therapy are one component of a multi-disciplinary discharge planning process, led by the attending physician.  Recommendations may be updated based on patient status, additional functional criteria and insurance authorization.   Assistance Recommended at Discharge None  Patient can return home with the following Assist for transportation    Functional Status Assessment  Patient has had a recent decline in their functional status and demonstrates the ability to make significant improvements in function in a reasonable and predictable amount of time.  Equipment Recommendations  None recommended by OT       Precautions / Restrictions Precautions Precautions: Cervical Precaution Booklet Issued: Yes (comment) Precaution Comments: Provided verbal instructions regarding body mechanics, ADL tasks, bed mobility, and cervical precautions. Provided handout for reference. Required Braces or Orthoses: Cervical Brace Cervical Brace: Soft collar;At all times Restrictions Weight Bearing Restrictions: No      Mobility Bed Mobility Overal bed mobility: Independent  Patient Response: Cooperative  Transfers Overall transfer level: Independent Equipment used: None     Balance Overall balance assessment: Independent       ADL either performed or assessed with clinical judgement   ADL Overall ADL's : Modified independent  General ADL Comments: Able to complete BADL tasks at Mod I level while maintaining cervical precautions.     Vision Baseline Vision/History: 1 Wears glasses Ability to See in Adequate Light: 0 Adequate Patient Visual Report: No change from baseline Vision Assessment?: No apparent visual deficits            Pertinent Vitals/Pain Pain Assessment Pain Assessment: Faces Faces Pain Scale: Hurts a little bit Pain Location: neck Pain Descriptors / Indicators: Discomfort, Operative site guarding Pain Intervention(s): Monitored during session     Hand Dominance Right   Extremity/Trunk Assessment Upper Extremity Assessment Upper Extremity Assessment: Overall WFL for tasks assessed   Lower Extremity Assessment Lower Extremity Assessment: Overall WFL for tasks assessed   Cervical / Trunk Assessment Cervical / Trunk Assessment: Neck Surgery   Communication Communication Communication: No difficulties   Cognition Arousal/Alertness: Awake/alert Behavior During Therapy: WFL for tasks assessed/performed Overall Cognitive Status: Within Functional Limits for tasks assessed                      Home Living Family/patient expects to be discharged to:: Private residence Living Arrangements: Spouse/significant other Available Help at Discharge: Family;Available PRN/intermittently Type of Home: House Home Access: Stairs to enter [employer] of Steps: 5-6 Entrance Stairs-Rails: Right Home Layout: One level     Bathroom Shower/Tub: Walk-in shower;Tub/shower unit (uses walk-in)   Bathroom Toilet: Standard     Home Equipment: None   Additional Comments: retired      Prior Functioning/Environment Prior Level of Function : Independent/Modified Independent           OT Problem List: Decreased knowledge of precautions  OT Treatment/Interventions:    Eval only   OT Goals(Current goals can be found in the care plan section) Acute Rehab OT Goals Patient Stated Goal: to go home  OT Frequency:  1X visit       AM-PAC OT "6 Clicks" Daily Activity     Outcome Measure Help from another person eating meals?: None Help from another person taking care of personal grooming?: None Help from another person toileting, which includes using toliet, bedpan, or urinal?: None Help from another person bathing (including washing, rinsing, drying)?: None Help from another person to put on and taking off regular upper body clothing?: None Help from another person to put on and taking off regular lower body clothing?: None 6 Click Score: 24   End of Session Equipment Utilized During Treatment: Cervical collar  Activity Tolerance: Patient tolerated treatment well Patient left: in bed;with call bell/phone within reach  OT Visit Diagnosis: Muscle weakness (generalized) (M62.81)                Time: 4239-5320 OT Time Calculation (min): 10 min Charges:  OT General Charges $OT Visit: 1 Visit OT Evaluation $OT Eval Low Complexity: 1 Low  Limmie Patricia, OTR/L,CBIS  Supplemental OT - [facility] and [employer] Secure Chat Preferred    Malaika Arnall, Charisse March 12/29/2022, 9:04 AM

## 2022-12-29 NOTE — Progress Notes (Signed)
Patient alert and oriented, mae's well, voiding adequate amount of urine, swallowing without difficulty, no c/o pain at time of discharge. Patient discharged home with family. Script and discharged instructions given to patient. Patient stated understanding of instructions given. Patient has an appointment with Dr. Jordan Likes in 2 weeks

## 2022-12-30 ENCOUNTER — Encounter: Payer: Self-pay | Admitting: *Deleted

## 2022-12-30 ENCOUNTER — Telehealth: Payer: Self-pay | Admitting: *Deleted

## 2022-12-30 NOTE — Transitions of Care (Post Inpatient/ED Visit) (Signed)
   12/30/2022  Name: Troy Little MRN: [id number] DOB: April 09, 1951  Today's TOC FU Call Status: Today's TOC FU Call Status:: Unsuccessul Call (1st Attempt) Unsuccessful Call (1st Attempt) Date: 12/30/22  Attempted to reach the patient regarding the most recent Inpatient visit; left HIPAA compliant voice message requesting call back  Follow Up Plan: Additional outreach attempts will be made to reach the patient to complete the Transitions of Care (Post Inpatient visit) call.   Caryl Pina, RN, BSN, CCRN Alumnus RN CM Care Coordination/ Transition of Care- [facility] Care Management [phone]: direct office

## 2022-12-31 ENCOUNTER — Encounter: Payer: Self-pay | Admitting: *Deleted

## 2022-12-31 ENCOUNTER — Telehealth: Payer: Self-pay | Admitting: *Deleted

## 2022-12-31 NOTE — Transitions of Care (Post Inpatient/ED Visit) (Signed)
   12/31/2022  Name: Troy Little MRN: [id number] DOB: [birth year]  Today's TOC FU Call Status: Today's TOC FU Call Status:: Unsuccessful Call (2nd Attempt) Unsuccessful Call (2nd Attempt) Date: 12/31/22 (Patient answered call and refused to provide HIPAA identifiers/ refused TOC call and requested no further outreaches)  Attempted to reach the patient regarding the most recent Inpatient visit.  Follow Up Plan: No further outreach attempts will be made at this time. We have been unable to contact the patient, per request of patient today.  Caryl Pina, RN, BSN, CCRN Alumnus RN CM Care Coordination/ Transition of Care- [facility] Care Management [phone]: direct office

## 2023-01-21 LAB — BASIC METABOLIC PANEL: EGFR: 73

## 2023-01-31 ENCOUNTER — Other Ambulatory Visit: Payer: Self-pay | Admitting: Emergency Medicine

## 2023-02-08 ENCOUNTER — Encounter: Payer: Self-pay | Admitting: Emergency Medicine

## 2023-02-08 ENCOUNTER — Ambulatory Visit: Payer: Medicare PPO | Admitting: Emergency Medicine

## 2023-02-08 VITALS — BP 130/74 | HR 52 | Temp 98.2°F | Ht 68.0 in | Wt 145.4 lb

## 2023-02-08 DIAGNOSIS — E785 Hyperlipidemia, unspecified: Secondary | ICD-10-CM | POA: Diagnosis not present

## 2023-02-08 DIAGNOSIS — G250 Essential tremor: Secondary | ICD-10-CM | POA: Diagnosis not present

## 2023-02-08 DIAGNOSIS — I209 Angina pectoris, unspecified: Secondary | ICD-10-CM | POA: Diagnosis not present

## 2023-02-08 DIAGNOSIS — E1159 Type 2 diabetes mellitus with other circulatory complications: Secondary | ICD-10-CM | POA: Diagnosis not present

## 2023-02-08 DIAGNOSIS — E1169 Type 2 diabetes mellitus with other specified complication: Secondary | ICD-10-CM

## 2023-02-08 DIAGNOSIS — I152 Hypertension secondary to endocrine disorders: Secondary | ICD-10-CM | POA: Diagnosis not present

## 2023-02-08 DIAGNOSIS — Z122 Encounter for screening for malignant neoplasm of respiratory organs: Secondary | ICD-10-CM

## 2023-02-08 DIAGNOSIS — Z1211 Encounter for screening for malignant neoplasm of colon: Secondary | ICD-10-CM

## 2023-02-08 DIAGNOSIS — Z7984 Long term (current) use of oral hypoglycemic drugs: Secondary | ICD-10-CM

## 2023-02-08 DIAGNOSIS — Z720 Tobacco use: Secondary | ICD-10-CM

## 2023-02-08 LAB — POCT GLYCOSYLATED HEMOGLOBIN (HGB A1C): Hemoglobin A1C: 6.2 % — AB (ref 4.0–5.6)

## 2023-02-08 NOTE — Patient Instructions (Signed)

## 2023-02-08 NOTE — Assessment & Plan Note (Signed)
Chronic stable conditions Continue simvastatin 20 mg daily Diet and nutrition discussed

## 2023-02-08 NOTE — Progress Notes (Signed)
Troy Little 72 y.o.   Chief Complaint  Patient presents with   Medical Management of Chronic Issues    f/u appt, no concerns     HISTORY OF PRESENT ILLNESS: This is a 72 y.o. male here for 27-month follow-up of chronic medical problems including diabetes, hypertension, and dyslipidemia Overall doing well.  Has no complaints or medical concerns today. Recent cervical spine surgery done last month.  Recovering well.  HPI   Prior to Admission medications   Medication Sig Start Date End Date Taking? Authorizing Provider  Alcohol Swabs (ALCOHOL PREP PAD) 70 % PADS USE PAD TO AFFECTED AREA AS DIRECTED 06/16/22  Yes [provider]  Alcohol Swabs (DROPSAFE ALCOHOL PREP) 70 % PADS APPLY TO AFFECTED AREA AS DIRECTED 03/15/22  Yes Makena Murdock, Eilleen Kempf, MD  amLODipine (NORVASC) 5 MG tablet Take 2.5 tablets by mouth daily. 07/08/22  Yes [provider]  aspirin EC 81 MG tablet Take 81 mg by mouth daily.   Yes [provider]  blood glucose meter kit and supplies Dispense based on patient and insurance preference. Use to test in the morning, noon, and bedtime as directed. (FOR ICD-10 E10.9, E11.9). 10/30/20  Yes Troy Bass, FNP  Carboxymethylcellulose Sod PF (REFRESH PLUS) 0.5 % SOLN Place 1 drop into both eyes in the morning, at noon, and at bedtime.   Yes [provider]  cetirizine (ZYRTEC) 10 MG tablet TAKE 1 TABLET EVERY DAY 01/31/23  Yes Troy Little, Eilleen Kempf, MD  cyclobenzaprine (FLEXERIL) 10 MG tablet Take 1 tablet (10 mg total) by mouth 3 (three) times daily as needed for muscle spasms. 12/29/22  Yes Pool, Sherilyn Cooter, MD  diphenhydramine-acetaminophen (TYLENOL PM) 25-500 MG TABS tablet Take 1 tablet by mouth 2 (two) times daily as needed (pain/sleep).   Yes [provider]  Glucose 15 GM/32ML GEL TAKE 1 TUBE BY MOUTH AS DIRECTED BY YOUR PROVIDER AS NEEDED LOW GLUCOSE 06/16/22  Yes [provider]  isosorbide mononitrate  (IMDUR) 30 MG 24 hr tablet Take 15 mg by mouth See admin instructions. Take 15 mg daily, may take a second 15 mg dose at night as needed for high blood pressure 06/16/22  Yes [provider]  ketotifen (ZADITOR) 0.035 % ophthalmic solution Place 1 drop into both eyes in the morning and at bedtime.   Yes [provider]  lisinopril (ZESTRIL) 40 MG tablet Take 1 tablet (40 mg total) by mouth daily. Overdue for Annual appt must see provider for future refills Patient taking differently: Take 20 mg by mouth daily. Overdue for Annual appt must see provider for future refills 07/14/20  Yes Troy Bass, FNP  metFORMIN (GLUCOPHAGE) 500 MG tablet Take 1 tablet (500 mg total) by mouth daily with breakfast. Per patient, he takes 1/4 tablet per day "when he needs it" Patient taking differently: Take 250-500 mg by mouth daily with breakfast. 11/10/18  Yes Troy Bass, FNP  Omega-3 Fatty Acids (FISH OIL) 1000 MG CAPS Take 2,000 mg by mouth 2 (two) times daily.   Yes [provider]  pantoprazole (PROTONIX) 40 MG tablet Take 40 mg by mouth 2 (two) times daily. 07/14/22  Yes [provider]  primidone (MYSOLINE) 50 MG tablet Take 1 tablet (50 mg total) by mouth 2 (two) times daily. Per patient, takes  1 1/2 tablet in the am and 1 tablet in the afternoon Patient taking differently: Take 75 mg by mouth 3 (three) times daily. 11/10/18  Yes [name]  Margarita Grizzle, FNP  propranolol (INDERAL) 10 MG tablet Patient takes 1 1/2 tablet in the am, 1 in the afternoon, 1 in the evening Patient taking differently: Take 15 mg by mouth 3 (three) times daily. 11/10/18  Yes Troy Bass, FNP  simvastatin (ZOCOR) 20 MG tablet Take 20 mg by mouth at bedtime.    Yes [provider]  terbinafine (LAMISIL) 1 % cream Apply 1 Application topically 2 (two) times daily. 07/08/22  Yes [provider]  triamcinolone cream (KENALOG) 0.1 % Apply 1 application  topically 2 (two) times daily. 04/25/20  Yes Myrlene Broker, MD  TRUE METRIX BLOOD GLUCOSE TEST test strip USE AS DIRECTED 10/10/22  Yes Illyria Sobocinski, Eilleen Kempf, MD  TRUEplus Lancets 33G MISC USE PER INSTRUCTIONS. 12/01/22  Yes Adilynne Fitzwater, Eilleen Kempf, MD  azelastine (OPTIVAR) 0.05 % ophthalmic solution Place 1 drop into both eyes 2 (two) times daily. Patient not taking: Reported on 12/10/2022 09/07/22   Troy Quint, MD  famotidine (PEPCID) 20 MG tablet TAKE 1 TABLET TWICE DAILY Patient not taking: Reported on 12/10/2022 07/22/21   Troy Bass, FNP  HYDROcodone-acetaminophen (NORCO/VICODIN) 5-325 MG tablet Take 1 tablet by mouth every 4 (four) hours as needed for moderate pain ((score 4 to 6)). Patient not taking: Reported on 02/08/2023 12/29/22   Troy Sicks, MD    Allergies  Allergen Reactions   Prozac [Fluoxetine Hcl] Other (See Comments)    agitation   Dye Fdc Red [Red Dye] Rash    Patient Active Problem List   Diagnosis Date Noted   Cervical spondylosis with myelopathy and radiculopathy 12/28/2022   Allergic conjunctivitis of both eyes 09/07/2022   Facial lesion 09/07/2022   Other specified counseling 08/10/2022   Depression, unspecified 08/10/2022   History of recent acute infection 08/10/2022   Thyroid lesion 08/10/2022   Hyperglycemia 07/22/2022   Left sided sciatica 03/15/2022   Current smoker 03/15/2022   Angina pectoris (HCC)    Hypertension associated with diabetes (HCC) 01/22/2019   Diastolic dysfunction 11/03/2015   Hyperlipidemia 11/01/2015   Chronic neck pain 11/01/2015   TIA (transient ischemic attack) 10/31/2015   Dyslipidemia associated with type 2 diabetes mellitus (HCC) 10/31/2015   Anxiety and depression 10/31/2015   Tobacco use 10/31/2015   Essential tremor 10/31/2015    Past Medical History:  Diagnosis Date   Agoraphobia    Anxiety    Arthritis    Coronary artery disease    1 stent   Diabetes mellitus without complication (HCC)     Essential Tremor    GERD (gastroesophageal reflux disease)    Headache    Hepatitis 2012   pt states he had Hep C but was treated   History of hiatal hernia    Hypercholesteremia    Hypertension    MDD (major depressive disorder)    Psychosis (HCC)    Stroke (HCC)    "series of little mini strokes" per pt    Past Surgical History:  Procedure Laterality Date   ANTERIOR CERVICAL DECOMPRESSION/DISCECTOMY FUSION 4 LEVELS N/A 12/28/2022   Procedure: CERVICAL THREE-FOUR, CERVICAL FOUR-FIVE, CERVICAL FIVE-SIX, CERVICAL SIX-SEVEN ANTERIOR CERVICAL DECOMPRESSION/DISCECTOMY FUSION;  Surgeon: Troy Sicks, MD;  Location: [facility] OR;  Service: Neurosurgery;  Laterality: N/A;   CARDIAC CATHETERIZATION     CHOLECYSTECTOMY     CORONARY STENT INTERVENTION N/A 09/24/2019   Procedure: CORONARY STENT INTERVENTION;  Surgeon: Corky Crafts, MD;  Location: [facility] INVASIVE CV LAB;  Service: Cardiovascular;  Laterality: N/A;   LEFT HEART CATH  N/A 09/24/2019   Procedure: Left Heart Cath;  Surgeon: Corky Crafts, MD;  Location: [facility] INVASIVE CV LAB;  Service: Cardiovascular;  Laterality: N/A;    Social History   Socioeconomic History   Marital status: Married    Spouse name: Marylene Land   Number of children: 1   Years of education: 12   Highest education level: Some college, no degree  Occupational History   Occupation: Disabled  Tobacco Use   Smoking status: Every Day    Packs/day: 0.50    Years: 47.00    Additional pack years: 0.00    Total pack years: 23.50    Types: Cigarettes   Smokeless tobacco: Never   Tobacco comments:    Patient states he is not ready to quit smoking  Vaping Use   Vaping Use: Never used  Substance and Sexual Activity   Alcohol use: Not Currently   Drug use: Not Currently   Sexual activity: Not on file  Other Topics Concern   Not on file  Social History Narrative   Not on file   Social Determinants of Health   Financial Resource Strain: Low Risk  (07/30/2022)    Overall Financial Resource Strain (CARDIA)    Difficulty of Paying Living Expenses: Not hard at all  Food Insecurity: No Food Insecurity (07/30/2022)   Hunger Vital Sign    Worried About Running Out of Food in the Last Year: Never true    Ran Out of Food in the Last Year: Never true  Transportation Needs: No Transportation Needs (07/30/2022)   PRAPARE - Administrator, Civil Service (Medical): No    Lack of Transportation (Non-Medical): No  Physical Activity: Inactive (07/30/2022)   Exercise Vital Sign    Days of Exercise per Week: 0 days    Minutes of Exercise per Session: 0 min  Stress: Stress Concern Present (07/30/2022)   [employer] of Occupational Health - Occupational Stress Questionnaire    Feeling of Stress : To some extent  Social Connections: Moderately Isolated (07/30/2022)   Social Connection and Isolation Panel [NHANES]    Frequency of Communication with Friends and Family: More than three times a week    Frequency of Social Gatherings with Friends and Family: Once a week    Attends Religious Services: Never    Database administrator or Organizations: No    Attends Banker Meetings: Never    Marital Status: Married  Catering manager Violence: Not At Risk (07/30/2022)   Humiliation, Afraid, Rape, and Kick questionnaire    Fear of Current or Ex-Partner: No    Emotionally Abused: No    Physically Abused: No    Sexually Abused: No    Family History  Problem Relation Age of Onset   Cancer Mother      Review of Systems  Constitutional: Negative.  Negative for chills and fever.  HENT: Negative.  Negative for congestion and sore throat.   Respiratory: Negative.  Negative for cough and shortness of breath.   Cardiovascular: Negative.  Negative for chest pain and palpitations.  Gastrointestinal:  Negative for abdominal pain, diarrhea, nausea and vomiting.  Genitourinary: Negative.  Negative for dysuria and hematuria.  Skin: Negative.   Negative for rash.  Neurological: Negative.  Negative for dizziness and headaches.  All other systems reviewed and are negative.   Vitals:   02/08/23 1000  BP: 130/74  Pulse: (!) 52  Temp: 98.2 F (36.8 C)  SpO2: 99%    Physical  Exam Vitals reviewed.  Constitutional:      Appearance: Normal appearance.  HENT:     Head: Normocephalic.     Mouth/Throat:     Mouth: Mucous membranes are moist.     Pharynx: Oropharynx is clear.  Eyes:     Extraocular Movements: Extraocular movements intact.     Pupils: Pupils are equal, round, and reactive to light.  Cardiovascular:     Rate and Rhythm: Normal rate and regular rhythm.     Pulses: Normal pulses.     Heart sounds: Normal heart sounds.  Pulmonary:     Effort: Pulmonary effort is normal.     Breath sounds: Normal breath sounds.  Skin:    General: Skin is warm and dry.  Neurological:     Mental Status: He is alert and oriented to person, place, and time.  Psychiatric:        Mood and Affect: Mood normal.        Behavior: Behavior normal.    Results for orders placed or performed in visit on 02/08/23 (from the past 24 hour(s))  POCT HgB A1C     Status: Abnormal   Collection Time: 02/08/23 10:36 AM  Result Value Ref Range   Hemoglobin A1C 6.2 (A) 4.0 - 5.6 %   HbA1c POC (<> result, manual entry)     HbA1c, POC (prediabetic range)     HbA1c, POC (controlled diabetic range)       ASSESSMENT & PLAN: A total of 43 minutes was spent with the patient and counseling/coordination of care regarding preparing for this visit, review of most recent office visit notes, review of most recent blood work results including interpretation of today's hemoglobin A1c, review of multiple chronic medical conditions and their management, review of all medications, cardiovascular risks associated with hypertension and diabetes, education on nutrition, prognosis, documentation, and need for follow-up.  Problem List Items Addressed This Visit        Cardiovascular and Mediastinum   Hypertension associated with diabetes (HCC) - Primary    Well-controlled hypertension BP Readings from Last 3 Encounters:  02/08/23 130/74  12/29/22 128/62  12/15/22 137/66  Continue lisinopril 20 mg and amlodipine 5 mg daily Well-controlled diabetes with hemoglobin A1c of 6.2 Continue metformin 500 mg daily Cardiovascular risks associated with hypertension and diabetes discussed Diet and nutrition discussed Follow-up in 6 months       Relevant Orders   POCT HgB A1C (Completed)   Angina pectoris (HCC)    Stable.  No recent anginal episodes. Continues isosorbide mononitrate 15 mg daily        Endocrine   Dyslipidemia associated with type 2 diabetes mellitus (HCC)    Chronic stable conditions Continue simvastatin 20 mg daily Diet and nutrition discussed        Nervous and Auditory   Essential tremor    Stable. Continues propranolol 10 mg 3 times a day        Other   Tobacco use    Continues to smoke. Cardiovascular and cancer risks associated with smoking discussed Smoking cessation advice given. Recommend lung cancer screening      Relevant Orders   Ambulatory Referral for Lung Cancer Scre   Other Visit Diagnoses     Screening for lung cancer       Relevant Orders   Ambulatory Referral for Lung Cancer Scre   Screening for colon cancer       Relevant Orders   Cologuard  Patient Instructions  Diabetes Mellitus and Nutrition, Adult When you have diabetes, or diabetes mellitus, it is very important to have healthy eating habits because your blood sugar (glucose) levels are greatly affected by what you eat and drink. Eating healthy foods in the right amounts, at about the same times every day, can help you: Manage your blood glucose. Lower your risk of heart disease. Improve your blood pressure. Reach or maintain a healthy weight. What can affect my meal plan? Every person with diabetes is different, and each  person has different needs for a meal plan. Your health care provider may recommend that you work with a dietitian to make a meal plan that is best for you. Your meal plan may vary depending on factors such as: The calories you need. The medicines you take. Your weight. Your blood glucose, blood pressure, and cholesterol levels. Your activity level. Other health conditions you have, such as heart or kidney disease. How do carbohydrates affect me? Carbohydrates, also called carbs, affect your blood glucose level more than any other type of food. Eating carbs raises the amount of glucose in your blood. It is important to know how many carbs you can safely have in each meal. This is different for every person. Your dietitian can help you calculate how many carbs you should have at each meal and for each snack. How does alcohol affect me? Alcohol can cause a decrease in blood glucose (hypoglycemia), especially if you use insulin or take certain diabetes medicines by mouth. Hypoglycemia can be a life-threatening condition. Symptoms of hypoglycemia, such as sleepiness, dizziness, and confusion, are similar to symptoms of having too much alcohol. Do not drink alcohol if: Your health care provider tells you not to drink. You are pregnant, may be pregnant, or are planning to become pregnant. If you drink alcohol: Limit how much you have to: 0-1 drink a day for women. 0-2 drinks a day for men. Know how much alcohol is in your drink. In the [place] one drink equals one 12 oz bottle of beer (355 mL), one 5 oz glass of wine (148 mL), or one 1 oz glass of hard liquor (44 mL). Keep yourself hydrated with water, diet soda, or unsweetened iced tea. Keep in mind that regular soda, juice, and other mixers may contain a lot of sugar and must be counted as carbs. What are tips for following this plan?  Reading food labels Start by checking the serving size on the Nutrition Facts label of packaged foods and drinks.  The number of calories and the amount of carbs, fats, and other nutrients listed on the label are based on one serving of the item. Many items contain more than one serving per package. Check the total grams (g) of carbs in one serving. Check the number of grams of saturated fats and trans fats in one serving. Choose foods that have a low amount or none of these fats. Check the number of milligrams (mg) of salt (sodium) in one serving. Most people should limit total sodium intake to less than 2,300 mg per day. Always check the nutrition information of foods labeled as "low-fat" or "nonfat." These foods may be higher in added sugar or refined carbs and should be avoided. Talk to your dietitian to identify your daily goals for nutrients listed on the label. Shopping Avoid buying canned, pre-made, or processed foods. These foods tend to be high in fat, sodium, and added sugar. Shop around the outside edge of the grocery store.  This is where you will most often find fresh fruits and vegetables, bulk grains, fresh meats, and fresh dairy products. Cooking Use low-heat cooking methods, such as baking, instead of high-heat cooking methods, such as deep frying. Cook using healthy oils, such as Troy, canola, or sunflower oil. Avoid cooking with butter, cream, or high-fat meats. Meal planning Eat meals and snacks regularly, preferably at the same times every day. Avoid going long periods of time without eating. Eat foods that are high in fiber, such as fresh fruits, vegetables, beans, and whole grains. Eat 4-6 oz (112-168 g) of lean protein each day, such as lean meat, chicken, fish, eggs, or tofu. One ounce (oz) (28 g) of lean protein is equal to: 1 oz (28 g) of meat, chicken, or fish. 1 egg.  cup (62 g) of tofu. Eat some foods each day that contain healthy fats, such as avocado, nuts, seeds, and fish. What foods should I eat? Fruits Berries. Apples. Oranges. Peaches. Apricots. Plums. Grapes. Mangoes.  Papayas. Pomegranates. Kiwi. Cherries. Vegetables Leafy greens, including lettuce, spinach, kale, chard, collard greens, mustard greens, and cabbage. Beets. Cauliflower. Broccoli. Carrots. Green beans. Tomatoes. Peppers. Onions. Cucumbers. Brussels sprouts. Grains Whole grains, such as whole-wheat or whole-grain bread, crackers, tortillas, cereal, and pasta. Unsweetened oatmeal. Quinoa. Brown or wild rice. Meats and other proteins Seafood. Poultry without skin. Lean cuts of poultry and beef. Tofu. Nuts. Seeds. Dairy Low-fat or fat-free dairy products such as milk, yogurt, and cheese. The items listed above may not be a complete list of foods and beverages you can eat and drink. Contact a dietitian for more information. What foods should I avoid? Fruits Fruits canned with syrup. Vegetables Canned vegetables. Frozen vegetables with butter or cream sauce. Grains Refined white flour and flour products such as bread, pasta, snack foods, and cereals. Avoid all processed foods. Meats and other proteins Fatty cuts of meat. Poultry with skin. Breaded or fried meats. Processed meat. Avoid saturated fats. Dairy Full-fat yogurt, cheese, or milk. Beverages Sweetened drinks, such as soda or iced tea. The items listed above may not be a complete list of foods and beverages you should avoid. Contact a dietitian for more information. Questions to ask a health care provider Do I need to meet with a certified diabetes care and education specialist? Do I need to meet with a dietitian? What number can I call if I have questions? When are the best times to check my blood glucose? Where to find more information: American Diabetes Association: diabetes.org Academy of Nutrition and Dietetics: eatright.[employer] of Diabetes and Digestive and Kidney Diseases: StageSync.si Association of Diabetes Care & Education Specialists: diabeteseducator.org Summary It is important to have healthy eating  habits because your blood sugar (glucose) levels are greatly affected by what you eat and drink. It is important to use alcohol carefully. A healthy meal plan will help you manage your blood glucose and lower your risk of heart disease. Your health care provider may recommend that you work with a dietitian to make a meal plan that is best for you. This information is not intended to replace advice given to you by your health care provider. Make sure you discuss any questions you have with your health care provider. Document Revised: 04/09/2020 Document Reviewed: 04/09/2020 Elsevier Patient Education  2023 Elsevier Inc.     Edwina Barth, MD [facility]

## 2023-02-08 NOTE — Assessment & Plan Note (Signed)
Stable.  No recent anginal episodes. Continues isosorbide mononitrate 15 mg daily

## 2023-02-08 NOTE — Assessment & Plan Note (Signed)
Well-controlled hypertension BP Readings from Last 3 Encounters:  02/08/23 130/74  12/29/22 128/62  12/15/22 137/66  Continue lisinopril 20 mg and amlodipine 5 mg daily Well-controlled diabetes with hemoglobin A1c of 6.2 Continue metformin 500 mg daily Cardiovascular risks associated with hypertension and diabetes discussed Diet and nutrition discussed Follow-up in 6 months

## 2023-02-08 NOTE — Assessment & Plan Note (Signed)
Stable. Continues propranolol 10 mg 3 times a day

## 2023-02-08 NOTE — Assessment & Plan Note (Signed)
Continues to smoke. Cardiovascular and cancer risks associated with smoking discussed Smoking cessation advice given. Recommend lung cancer screening

## 2023-03-29 ENCOUNTER — Other Ambulatory Visit: Payer: Self-pay

## 2023-03-29 DIAGNOSIS — Z87891 Personal history of nicotine dependence: Secondary | ICD-10-CM

## 2023-03-29 DIAGNOSIS — Z122 Encounter for screening for malignant neoplasm of respiratory organs: Secondary | ICD-10-CM

## 2023-03-29 DIAGNOSIS — F17210 Nicotine dependence, cigarettes, uncomplicated: Secondary | ICD-10-CM

## 2023-04-29 ENCOUNTER — Other Ambulatory Visit: Payer: Self-pay

## 2023-04-29 ENCOUNTER — Emergency Department (HOSPITAL_COMMUNITY)
Admission: EM | Admit: 2023-04-29 | Discharge: 2023-04-29 | Disposition: A | Discharge status: Home / Self Care | Payer: No Typology Code available for payment source | Attending: Emergency Medicine | Admitting: Emergency Medicine

## 2023-04-29 ENCOUNTER — Encounter (HOSPITAL_COMMUNITY): Payer: Self-pay

## 2023-04-29 ENCOUNTER — Emergency Department (HOSPITAL_COMMUNITY): Payer: No Typology Code available for payment source

## 2023-04-29 DIAGNOSIS — E119 Type 2 diabetes mellitus without complications: Secondary | ICD-10-CM | POA: Insufficient documentation

## 2023-04-29 DIAGNOSIS — Z79899 Other long term (current) drug therapy: Secondary | ICD-10-CM | POA: Insufficient documentation

## 2023-04-29 DIAGNOSIS — R232 Flushing: Secondary | ICD-10-CM | POA: Diagnosis not present

## 2023-04-29 DIAGNOSIS — R001 Bradycardia, unspecified: Secondary | ICD-10-CM | POA: Diagnosis not present

## 2023-04-29 DIAGNOSIS — Z7982 Long term (current) use of aspirin: Secondary | ICD-10-CM | POA: Diagnosis not present

## 2023-04-29 DIAGNOSIS — I10 Essential (primary) hypertension: Secondary | ICD-10-CM | POA: Insufficient documentation

## 2023-04-29 DIAGNOSIS — R1084 Generalized abdominal pain: Secondary | ICD-10-CM | POA: Insufficient documentation

## 2023-04-29 DIAGNOSIS — N200 Calculus of kidney: Secondary | ICD-10-CM

## 2023-04-29 DIAGNOSIS — M549 Dorsalgia, unspecified: Secondary | ICD-10-CM | POA: Diagnosis not present

## 2023-04-29 DIAGNOSIS — R1030 Lower abdominal pain, unspecified: Secondary | ICD-10-CM | POA: Diagnosis not present

## 2023-04-29 DIAGNOSIS — E871 Hypo-osmolality and hyponatremia: Secondary | ICD-10-CM | POA: Diagnosis not present

## 2023-04-29 DIAGNOSIS — N23 Unspecified renal colic: Secondary | ICD-10-CM

## 2023-04-29 DIAGNOSIS — R112 Nausea with vomiting, unspecified: Secondary | ICD-10-CM | POA: Diagnosis not present

## 2023-04-29 DIAGNOSIS — R110 Nausea: Secondary | ICD-10-CM | POA: Diagnosis not present

## 2023-04-29 DIAGNOSIS — R109 Unspecified abdominal pain: Secondary | ICD-10-CM | POA: Diagnosis present

## 2023-04-29 DIAGNOSIS — R1110 Vomiting, unspecified: Secondary | ICD-10-CM | POA: Diagnosis not present

## 2023-04-29 LAB — URINALYSIS, ROUTINE W REFLEX MICROSCOPIC
Bacteria, UA: NONE SEEN
Bilirubin Urine: NEGATIVE
Glucose, UA: NEGATIVE mg/dL
Ketones, ur: NEGATIVE mg/dL
Leukocytes,Ua: NEGATIVE
Nitrite: NEGATIVE
Protein, ur: NEGATIVE mg/dL
RBC / HPF: >50 RBC/hpf (ref 0–5)
Specific Gravity, Urine: 1.014 (ref 1.005–1.030)
pH: 5 (ref 5.0–8.0)

## 2023-04-29 LAB — COMPREHENSIVE METABOLIC PANEL
ALT: 9 U/L (ref 0–44)
AST: 14 U/L — ABNORMAL LOW (ref 15–41)
Albumin: 4 g/dL (ref 3.5–5.0)
Alkaline Phosphatase: 52 U/L (ref 38–126)
Anion gap: 10 (ref 5–15)
BUN: 10 mg/dL (ref 8–23)
CO2: 22 mmol/L (ref 22–32)
Calcium: 9.1 mg/dL (ref 8.9–10.3)
Chloride: 101 mmol/L (ref 98–111)
Creatinine, Ser: 1.01 mg/dL (ref 0.61–1.24)
GFR, Estimated: >60 mL/min (ref >60)
Glucose, Bld: 159 mg/dL — ABNORMAL HIGH (ref 70–99)
Potassium: 4.1 mmol/L (ref 3.5–5.1)
Sodium: 133 mmol/L — ABNORMAL LOW (ref 135–145)
Total Bilirubin: 0.4 mg/dL (ref 0.3–1.2)
Total Protein: 7.1 g/dL (ref 6.5–8.1)

## 2023-04-29 LAB — CBC WITH DIFFERENTIAL/PLATELET
Abs Immature Granulocytes: 0.03 10*3/uL (ref 0.00–0.07)
Basophils Absolute: 0 10*3/uL (ref 0.0–0.1)
Basophils Relative: 0 %
Eosinophils Absolute: 0.3 10*3/uL (ref 0.0–0.5)
Eosinophils Relative: 5 %
HCT: 43.5 % (ref 39.0–52.0)
Hemoglobin: 14.2 g/dL (ref 13.0–17.0)
Immature Granulocytes: 1 %
Lymphocytes Relative: 37 %
Lymphs Abs: 2.2 10*3/uL (ref 0.7–4.0)
MCH: 25.8 pg — ABNORMAL LOW (ref 26.0–34.0)
MCHC: 32.6 g/dL (ref 30.0–36.0)
MCV: 78.9 fL — ABNORMAL LOW (ref 80.0–100.0)
Monocytes Absolute: 0.6 10*3/uL (ref 0.1–1.0)
Monocytes Relative: 10 %
Neutro Abs: 2.8 10*3/uL (ref 1.7–7.7)
Neutrophils Relative %: 47 %
Platelets: 174 10*3/uL (ref 150–400)
RBC: 5.51 MIL/uL (ref 4.22–5.81)
RDW: 14.4 % (ref 11.5–15.5)
WBC: 5.9 10*3/uL (ref 4.0–10.5)
nRBC: 0 % (ref 0.0–0.2)

## 2023-04-29 LAB — LIPASE, BLOOD: Lipase: 23 U/L (ref 11–51)

## 2023-04-29 MED ORDER — HYDROMORPHONE HCL 1 MG/ML IJ SOLN
1.0000 mg | Freq: Once | INTRAMUSCULAR | Status: AC
  Administered 2023-04-29: 1 mg via INTRAVENOUS
  Filled 2023-04-29: qty 1

## 2023-04-29 MED ORDER — OXYCODONE-ACETAMINOPHEN 5-325 MG PO TABS
1.0000 | ORAL_TABLET | Freq: Three times a day (TID) | ORAL | 0 refills | Status: DC | PRN
Start: 2023-04-29 — End: 2023-12-01

## 2023-04-29 MED ORDER — NAPROXEN 500 MG PO TABS: 500.0000 mg | ORAL_TABLET | Freq: Two times a day (BID) | ORAL | 0 refills | Status: DC

## 2023-04-29 MED ORDER — ONDANSETRON 8 MG PO TBDP: 8.0000 mg | ORAL_TABLET | Freq: Three times a day (TID) | ORAL | 0 refills | Status: DC | PRN

## 2023-04-29 MED ORDER — ONDANSETRON HCL 4 MG/2ML IJ SOLN
4.0000 mg | Freq: Once | INTRAMUSCULAR | Status: AC
  Administered 2023-04-29: 4 mg via INTRAVENOUS
  Filled 2023-04-29: qty 2

## 2023-04-29 MED ORDER — HYDROMORPHONE HCL 1 MG/ML IJ SOLN
INTRAMUSCULAR | Status: AC
  Filled 2023-04-29: qty 1

## 2023-04-29 MED ORDER — SODIUM CHLORIDE 0.9 % IV BOLUS
500.0000 mL | Freq: Once | INTRAVENOUS | Status: AC
  Administered 2023-04-29: 500 mL via INTRAVENOUS

## 2023-04-29 NOTE — Discharge Instructions (Signed)
We saw you in the ER for the abdominal pain. °Our results indicate that you have a kidney stone. °We were able to get your pain is relative control, and we can safely send you home. ° °Take the meds prescribed. °Set up an appointment with the Urologist. °If the pain is unbearable, you start having fevers, chills, and are unable to keep any meds down - then return to the ER. °  °

## 2023-04-29 NOTE — ED Provider Notes (Signed)
  Physical Exam  BP 120/61   Pulse (!) 59   Temp (!) 97.3 F (36.3 C)   Resp 13   Ht 5\' 8"  (1.727 m)   Wt 68 kg   SpO2 95%   BMI 22.81 kg/m   Physical Exam  Procedures  Procedures  ED Course / MDM    Medical Decision Making Amount and/or Complexity of Data Reviewed Labs: ordered. Radiology: ordered.  Risk Prescription drug management.   Patient had presented to the ER with flank pain on the right side.  On my reassessment, patient was feeling better already.  CT scan has been completed and shows 2 mm stone in the bladder.  Results discussed with the patient.  Return precautions discussed.  Currently pain-free.       Derwood Kaplan, MD 04/29/23 [phone]

## 2023-04-29 NOTE — ED Triage Notes (Signed)
Arrives [facility] from home with sudden severe right sided flank pain that woke him from  his sleep.   Several episodes of vomiting prior to arrival.

## 2023-04-29 NOTE — ED Provider Notes (Signed)
[facility] Provider Note   CSN: [id number] Arrival date & time: 04/29/23  0551     History  Chief Complaint  Patient presents with   Flank Pain    Troy Little is a 72 y.o. male.  The history is provided by the patient and medical records.  Flank Pain  [facility] Little is a 72 y.o. male who presents to the [facility] complaining of flank pain.  He presents to the [facility] department for evaluation of 3 hours of right flank pain.  Pain radiates to the side.  He has had associated vomiting, flushing.  No fevers, dysuria.  No prior similar symptoms.  No reported injuries.   Hx/o DM, HTN, cholecystectomy    Home Medications Prior to Admission medications   Medication Sig Start Date End Date Taking? Authorizing Provider  Alcohol Swabs (ALCOHOL PREP PAD) 70 % PADS USE PAD TO AFFECTED AREA AS DIRECTED 06/16/22   [provider]  Alcohol Swabs (DROPSAFE ALCOHOL PREP) 70 % PADS APPLY TO AFFECTED AREA AS DIRECTED 03/15/22   [name], MD  amLODipine (NORVASC) 5 MG tablet Take 2.5 tablets by mouth daily. 07/08/22   [provider]  aspirin EC 81 MG tablet Take 81 mg by mouth daily.    [provider]  azelastine (OPTIVAR) 0.05 % ophthalmic solution Place 1 drop into both eyes 2 (two) times daily. Patient not taking: Reported on 12/10/2022 09/07/22   [name], MD  blood glucose meter kit and supplies Dispense based on patient and insurance preference. Use to test in the morning, noon, and bedtime as directed. (FOR ICD-10 E10.9, E11.9). 10/30/20   [name], FNP  Carboxymethylcellulose Sod PF (REFRESH PLUS) 0.5 % SOLN Place 1 drop into both eyes in the morning, at noon, and at bedtime.    [provider]  cetirizine (ZYRTEC) 10 MG tablet TAKE 1 TABLET EVERY DAY 01/31/23   [name], MD  cyclobenzaprine (FLEXERIL) 10 MG tablet Take 1 tablet (10 mg  total) by mouth 3 (three) times daily as needed for muscle spasms. 12/29/22   [name], MD  diphenhydramine-acetaminophen (TYLENOL PM) 25-500 MG TABS tablet Take 1 tablet by mouth 2 (two) times daily as needed (pain/sleep).    [provider]  famotidine (PEPCID) 20 MG tablet TAKE 1 TABLET TWICE DAILY Patient not taking: Reported on 12/10/2022 07/22/21   [name], FNP  Glucose 15 GM/32ML GEL TAKE 1 TUBE BY MOUTH AS DIRECTED BY YOUR PROVIDER AS NEEDED LOW GLUCOSE 06/16/22   [provider]  HYDROcodone-acetaminophen (NORCO/VICODIN) 5-325 MG tablet Take 1 tablet by mouth every 4 (four) hours as needed for moderate pain ((score 4 to 6)). Patient not taking: Reported on 02/08/2023 12/29/22   [name], MD  isosorbide mononitrate (IMDUR) 30 MG 24 hr tablet Take 15 mg by mouth See admin instructions. Take 15 mg daily, may take a second 15 mg dose at night as needed for high blood pressure 06/16/22   [provider]  ketotifen (ZADITOR) 0.035 % ophthalmic solution Place 1 drop into both eyes in the morning and at bedtime.    [provider]  lisinopril (ZESTRIL) 40 MG tablet Take 1 tablet (40 mg total) by mouth daily. Overdue for Annual appt must see provider for future refills Patient taking differently: Take 20 mg by mouth daily. Overdue for Annual appt must see provider for future refills 07/14/20   [name],  FNP  metFORMIN (GLUCOPHAGE) 500 MG tablet Take 1 tablet (500 mg total) by mouth daily with breakfast. Per patient, he takes 1/4 tablet per day "when he needs it" Patient taking differently: Take 250-500 mg by mouth daily with breakfast. 11/10/18   [name], FNP  Omega-3 Fatty Acids (FISH OIL) 1000 MG CAPS Take 2,000 mg by mouth 2 (two) times daily.    [provider]  pantoprazole (PROTONIX) 40 MG tablet Take 40 mg by mouth 2 (two) times daily. 07/14/22   [provider]  primidone (MYSOLINE) 50 MG tablet  Take 1 tablet (50 mg total) by mouth 2 (two) times daily. Per patient, takes  1 1/2 tablet in the am and 1 tablet in the afternoon Patient taking differently: Take 75 mg by mouth 3 (three) times daily. 11/10/18   [name], FNP  propranolol (INDERAL) 10 MG tablet Patient takes 1 1/2 tablet in the am, 1 in the afternoon, 1 in the evening Patient taking differently: Take 15 mg by mouth 3 (three) times daily. 11/10/18   [name], FNP  simvastatin (ZOCOR) 20 MG tablet Take 20 mg by mouth at bedtime.     [provider]  terbinafine (LAMISIL) 1 % cream Apply 1 Application topically 2 (two) times daily. 07/08/22   [provider]  triamcinolone cream (KENALOG) 0.1 % Apply 1 application topically 2 (two) times daily. 04/25/20   Myrlene Broker, MD  TRUE METRIX BLOOD GLUCOSE TEST test strip USE AS DIRECTED 10/10/22   [name], MD  TRUEplus Lancets 33G MISC USE PER INSTRUCTIONS. 12/01/22   [name], MD      Allergies    Prozac [fluoxetine hcl] and Dye fdc red [red dye]    Review of Systems   Review of Systems  Genitourinary:  Positive for flank pain.  All other systems reviewed and are negative.   Physical Exam Updated Vital Signs BP 127/63   Pulse (!) 56   Temp (!) 97.3 F (36.3 C)   Resp 14   Ht 5\' 8"  (1.727 m)   Wt 68 kg   SpO2 94%   BMI 22.81 kg/m  Physical Exam Vitals and nursing note reviewed.  Constitutional:      Appearance: He is well-developed.     Comments: Appears uncomfortable  HENT:     Head: Normocephalic and atraumatic.  Cardiovascular:     Rate and Rhythm: Regular rhythm. Bradycardia present.  Pulmonary:     Effort: Pulmonary effort is normal. No respiratory distress.     Breath sounds: Normal breath sounds.  Abdominal:     Palpations: Abdomen is soft.     Tenderness: There is no guarding or rebound.     Comments: Mild generalized tenderness  Musculoskeletal:        General: No tenderness.   Skin:    General: Skin is warm and dry.  Neurological:     Mental Status: He is alert and oriented to person, place, and time.  Psychiatric:        Behavior: Behavior normal.     ED Results / Procedures / Treatments   Labs (all labs ordered are listed, but only abnormal results are displayed) Labs Reviewed  CBC WITH DIFFERENTIAL/PLATELET - Abnormal; Notable for the following components:      Result Value   MCV 78.9 (*)    MCH 25.8 (*)    All other components within normal limits  COMPREHENSIVE METABOLIC PANEL - Abnormal; Notable for  the following components:   Sodium 133 (*)    Glucose, Bld 159 (*)    AST 14 (*)    All other components within normal limits  LIPASE, BLOOD  URINALYSIS, ROUTINE W REFLEX MICROSCOPIC    EKG None  Radiology No results found.  Procedures Procedures    Medications Ordered in ED Medications  HYDROmorphone (DILAUDID) injection 1 mg (1 mg Intravenous Total Dose 04/29/23 0626)  ondansetron (ZOFRAN) injection 4 mg (4 mg Intravenous Given 04/29/23 0618)  sodium chloride 0.9 % bolus 500 mL (500 mLs Intravenous New Bag/Given 04/29/23 0618)    ED Course/ Medical Decision Making/ A&P                                 Medical Decision Making Amount and/or Complexity of Data Reviewed Labs: ordered. Radiology: ordered.  Risk Prescription drug management.   Patient with history of hypertension, diabetes here for evaluation of right flank pain.  He is uncomfortable appearing on evaluation.  Labs with mild hyponatremia, normal renal function.  CBC without significant abnormality.  Plan to obtain CT stone study to evaluate for stone.  Patient care transferred pending reassessment, CT and urinalysis.        Final Clinical Impression(s) / ED Diagnoses Final diagnoses:  None    Rx / DC Orders ED Discharge Orders     None         Tilden Fossa, MD 04/29/23 [phone]

## 2023-05-02 ENCOUNTER — Other Ambulatory Visit: Payer: Self-pay | Admitting: Emergency Medicine

## 2023-05-02 DIAGNOSIS — E1165 Type 2 diabetes mellitus with hyperglycemia: Secondary | ICD-10-CM

## 2023-05-04 ENCOUNTER — Ambulatory Visit: Payer: No Typology Code available for payment source | Admitting: Internal Medicine

## 2023-05-04 NOTE — Progress Notes (Deleted)
Name: [facility] Little  MRN/ DOB: [id number], Aug 24, 1951    Age/ Sex: 72 y.o., male    PCP: [name], MD   Reason for Endocrinology Evaluation: MNG     Date of Initial Endocrinology Evaluation: 05/04/2023     HPI: [name] is a 72 y.o. male with a past medical history of ***. The patient presented for initial endocrinology clinic visit on 05/04/2023 for consultative assistance with his MNG.   Patient was diagnosed with multinodular goiter with a thyroid ultrasound 08/2022 revealing 2 left thyroid nodules, none of the nodules meet criteria for follow-up or FNA.  HISTORY:  Past Medical History:  Past Medical History:  Diagnosis Date   Agoraphobia    Anxiety    Arthritis    Coronary artery disease    1 stent   Diabetes mellitus without complication (HCC)    Essential Tremor    GERD (gastroesophageal reflux disease)    Headache    Hepatitis 2012   pt states he had Hep C but was treated   History of hiatal hernia    Hypercholesteremia    Hypertension    MDD (major depressive disorder)    Psychosis (HCC)    Stroke (HCC)    "series of little mini strokes" per pt   Past Surgical History:  Past Surgical History:  Procedure Laterality Date   ANTERIOR CERVICAL DECOMPRESSION/DISCECTOMY FUSION 4 LEVELS N/A 12/28/2022   Procedure: CERVICAL THREE-FOUR, CERVICAL FOUR-FIVE, CERVICAL FIVE-SIX, CERVICAL SIX-SEVEN ANTERIOR CERVICAL DECOMPRESSION/DISCECTOMY FUSION;  Surgeon: [name], MD;  Location: [facility] OR;  Service: Neurosurgery;  Laterality: N/A;   CARDIAC CATHETERIZATION     CHOLECYSTECTOMY     CORONARY STENT INTERVENTION N/A 09/24/2019   Procedure: CORONARY STENT INTERVENTION;  Surgeon: Corky Crafts, MD;  Location: [facility] INVASIVE CV LAB;  Service: Cardiovascular;  Laterality: N/A;   LEFT HEART CATH N/A 09/24/2019   Procedure: Left Heart Cath;  Surgeon: Corky Crafts, MD;  Location: [facility] INVASIVE CV LAB;  Service: Cardiovascular;  Laterality: N/A;     Social History:  reports that he has been smoking cigarettes. He has a 23.5 pack-year smoking history. He has never used smokeless tobacco. He reports that he does not currently use alcohol. He reports that he does not currently use drugs. Family History: family history includes Cancer in his mother.   HOME MEDICATIONS: Allergies as of 05/04/2023       Reactions   Prozac [fluoxetine Hcl] Other (See Comments)   agitation   Dye Fdc Red [red Dye #40 (allura Red)] Rash        Medication List        Accurate as of May 04, 2023  7:25 AM. If you have any questions, ask your nurse or doctor.          amLODipine 5 MG tablet Commonly known as: NORVASC Take 2.5 tablets by mouth daily.   aspirin EC 81 MG tablet Take 81 mg by mouth daily.   azelastine 0.05 % ophthalmic solution Commonly known as: OPTIVAR Place 1 drop into both eyes 2 (two) times daily.   blood glucose meter kit and supplies Dispense based on patient and insurance preference. Use to test in the morning, noon, and bedtime as directed. (FOR ICD-10 E10.9, E11.9).   cetirizine 10 MG tablet Commonly known as: ZYRTEC TAKE 1 TABLET EVERY DAY   cyclobenzaprine 10 MG tablet Commonly known as: FLEXERIL Take 1 tablet (10 mg total) by mouth 3 (three) times daily  as needed for muscle spasms.   diphenhydramine-acetaminophen 25-500 MG Tabs tablet Commonly known as: TYLENOL PM Take 1 tablet by mouth 2 (two) times daily as needed (pain/sleep).   DropSafe Alcohol Prep 70 % Pads APPLY TO AFFECTED AREA AS DIRECTED   Alcohol Prep Pad 70 % Pads USE PAD TO AFFECTED AREA AS DIRECTED   famotidine 20 MG tablet Commonly known as: PEPCID TAKE 1 TABLET TWICE DAILY   Fish Oil 1000 MG Caps Take 2,000 mg by mouth 2 (two) times daily.   Glucose 15 GM/32ML Gel TAKE 1 TUBE BY MOUTH AS DIRECTED BY YOUR PROVIDER AS NEEDED LOW GLUCOSE   isosorbide mononitrate 30 MG 24 hr tablet Commonly known as: IMDUR Take 15 mg by mouth See  admin instructions. Take 15 mg daily, may take a second 15 mg dose at night as needed for high blood pressure   ketotifen 0.035 % ophthalmic solution Commonly known as: ZADITOR Place 1 drop into both eyes in the morning and at bedtime.   lisinopril 40 MG tablet Commonly known as: ZESTRIL Take 1 tablet (40 mg total) by mouth daily. Overdue for Annual appt must see provider for future refills What changed: how much to take   metFORMIN 500 MG tablet Commonly known as: GLUCOPHAGE Take 1 tablet (500 mg total) by mouth daily with breakfast. Per patient, he takes 1/4 tablet per day "when he needs it" What changed:  how much to take additional instructions   naproxen 500 MG tablet Commonly known as: NAPROSYN Take 1 tablet (500 mg total) by mouth 2 (two) times daily.   ondansetron 8 MG disintegrating tablet Commonly known as: ZOFRAN-ODT Take 1 tablet (8 mg total) by mouth every 8 (eight) hours as needed for nausea.   oxyCODONE-acetaminophen 5-325 MG tablet Commonly known as: PERCOCET/ROXICET Take 1 tablet by mouth every 8 (eight) hours as needed for severe pain.   pantoprazole 40 MG tablet Commonly known as: PROTONIX Take 40 mg by mouth 2 (two) times daily.   primidone 50 MG tablet Commonly known as: MYSOLINE Take 1 tablet (50 mg total) by mouth 2 (two) times daily. Per patient, takes  1 1/2 tablet in the am and 1 tablet in the afternoon What changed:  how much to take when to take this additional instructions   propranolol 10 MG tablet Commonly known as: INDERAL Patient takes 1 1/2 tablet in the am, 1 in the afternoon, 1 in the evening What changed:  how much to take how to take this when to take this additional instructions   Refresh Plus 0.5 % Soln Generic drug: Carboxymethylcellulose Sod PF Place 1 drop into both eyes in the morning, at noon, and at bedtime.   simvastatin 20 MG tablet Commonly known as: ZOCOR Take 20 mg by mouth at bedtime.   terbinafine 1 %  cream Commonly known as: LAMISIL Apply 1 Application topically 2 (two) times daily.   triamcinolone cream 0.1 % Commonly known as: KENALOG Apply 1 application topically 2 (two) times daily.   True Metrix Blood Glucose Test test strip Generic drug: glucose blood USE AS DIRECTED   TRUEplus Lancets 33G Misc USE PER INSTRUCTIONS.          REVIEW OF SYSTEMS: A comprehensive ROS was conducted with the patient and is negative except as per HPI and below:  ROS     OBJECTIVE:  VS: There were no vitals taken for this visit.   Wt Readings from Last 3 Encounters:  04/29/23 150 lb (68  kg)  02/08/23 145 lb 6 oz (65.9 kg)  12/28/22 150 lb (68 kg)     EXAM: General: Pt appears well and is in NAD  Eyes: External eye exam normal without stare, lid lag or exophthalmos.  EOM intact.  PERRL.  Neck: General: Supple without adenopathy. Thyroid: Thyroid size normal.  No goiter or nodules appreciated. No thyroid bruit.  Lungs: Clear with good BS bilat   Heart: Auscultation: RRR.  Abdomen: Soft, nontender  Extremities:  BL LE: No pretibial edema   Mental Status: Judgment, insight: Intact Orientation: Oriented to time, place, and person Mood and affect: No depression, anxiety, or agitation     DATA REVIEWED: ***    Thyroid ultrasound 09/06/2022  Estimated total number of nodules >/= 1 cm: 1   Number of spongiform nodules >/=  2 cm not described below (TR1): 0   Number of mixed cystic and solid nodules >/= 1.5 cm not described below (TR2): 0   _________________________________________________________   Nodule 1: 0.9 x 0.8 x 0.8 cm predominantly cystic left mid thyroid nodule does not meet criteria for imaging surveillance or FNA.   Nodule # 2:   Location: Left; inferior   Maximum size: 1.1 cm; Other 2 dimensions: 0.9 x 1.0 cm   Composition: solid/almost completely solid (2)   Echogenicity: isoechoic (1)   Shape: not taller-than-wide (0)   Margins: ill-defined  (0)   Echogenic foci: none (0)   ACR TI-RADS total points: 3.   ACR TI-RADS risk category: TR3 (3 points).   ACR TI-RADS recommendations:   Given size (<1.4 cm) and appearance, this nodule does NOT meet TI-RADS criteria for biopsy or dedicated follow-up.   IMPRESSION: Solid and cystic left thyroid nodules do not meet criteria for FNA or imaging follow-up.   ASSESSMENT/PLAN/RECOMMENDATIONS:   Multinodular goiter:    Medications :  Signed electronically by: Lyndle Herrlich, MD  [facility] End[facility]  [facility] [address], [address] [place], [place] [address] Phone: [phone] FAX: [phone]   CC: [name], MD [address] [place] [address] Phone: [phone] Fax: [phone]   Return to Endocrinology clinic as below: Future Appointments  Date Time Provider Department Center  05/04/2023 10:30 AM Darnel Mchan, Konrad Dolores, MD [facility] None  06/01/2023  9:40 AM [facility] 2 [facility] [facility]  08/11/2023  9:00 AM [name], MD [facility] None

## 2023-05-05 ENCOUNTER — Telehealth: Payer: Self-pay | Admitting: *Deleted

## 2023-05-05 NOTE — Telephone Encounter (Signed)
Transition Care Management Unsuccessful Follow-up Telephone Call  Date of discharge and from where:  [facility] 04/29/2023  Attempts:  1st Attempt  Reason for unsuccessful TCM follow-up call:  No answer/busy

## 2023-05-06 ENCOUNTER — Telehealth: Payer: Self-pay | Admitting: *Deleted

## 2023-05-06 NOTE — Telephone Encounter (Signed)
Transition Care Management Unsuccessful Follow-up Telephone Call  Date of discharge and from where:  [facility] 04/29/2023  Attempts:  2nd Attempt  Reason for unsuccessful TCM follow-up call:  Left voice message

## 2023-05-12 ENCOUNTER — Other Ambulatory Visit: Payer: Self-pay | Admitting: Emergency Medicine

## 2023-05-12 DIAGNOSIS — E1165 Type 2 diabetes mellitus with hyperglycemia: Secondary | ICD-10-CM

## 2023-05-12 DIAGNOSIS — E1159 Type 2 diabetes mellitus with other circulatory complications: Secondary | ICD-10-CM

## 2023-05-30 LAB — HM DIABETES EYE EXAM

## 2023-06-01 ENCOUNTER — Other Ambulatory Visit: Payer: No Typology Code available for payment source

## 2023-06-23 ENCOUNTER — Other Ambulatory Visit: Payer: Self-pay | Admitting: Emergency Medicine

## 2023-06-24 ENCOUNTER — Other Ambulatory Visit: Payer: Self-pay | Admitting: Emergency Medicine

## 2023-06-24 DIAGNOSIS — Z1211 Encounter for screening for malignant neoplasm of colon: Secondary | ICD-10-CM

## 2023-06-24 DIAGNOSIS — Z1212 Encounter for screening for malignant neoplasm of rectum: Secondary | ICD-10-CM

## 2023-07-09 ENCOUNTER — Other Ambulatory Visit: Payer: Self-pay | Admitting: Emergency Medicine

## 2023-07-09 DIAGNOSIS — E1165 Type 2 diabetes mellitus with hyperglycemia: Secondary | ICD-10-CM

## 2023-07-20 LAB — HM COLONOSCOPY

## 2023-08-03 LAB — LAB REPORT - SCANNED
A1c: 6.6
Albumin/Creatinine Ratio, Urine, POC: 5.4
Creatinine, POC: 110.3 mg/dL
EGFR: 69
Microalbumin, Urine: 0.6

## 2023-08-10 ENCOUNTER — Telehealth: Payer: Self-pay

## 2023-08-10 NOTE — Patient Outreach (Signed)
Attempted to contact patient regarding DM eye, colorectal exam, urine micro, awv. Left voicemail for patient to return my call at [phone].  Nicholes Rough, CMA Care Guide [employer]

## 2023-08-11 ENCOUNTER — Ambulatory Visit (INDEPENDENT_AMBULATORY_CARE_PROVIDER_SITE_OTHER): Payer: Medicare PPO | Admitting: Emergency Medicine

## 2023-08-11 ENCOUNTER — Encounter: Payer: Self-pay | Admitting: Emergency Medicine

## 2023-08-11 VITALS — BP 130/78 | HR 58 | Temp 98.0°F | Ht 68.0 in | Wt 151.2 lb

## 2023-08-11 DIAGNOSIS — I152 Hypertension secondary to endocrine disorders: Secondary | ICD-10-CM

## 2023-08-11 DIAGNOSIS — E785 Hyperlipidemia, unspecified: Secondary | ICD-10-CM

## 2023-08-11 DIAGNOSIS — E1159 Type 2 diabetes mellitus with other circulatory complications: Secondary | ICD-10-CM

## 2023-08-11 DIAGNOSIS — E1169 Type 2 diabetes mellitus with other specified complication: Secondary | ICD-10-CM

## 2023-08-11 DIAGNOSIS — R519 Headache, unspecified: Secondary | ICD-10-CM | POA: Diagnosis not present

## 2023-08-11 DIAGNOSIS — I209 Angina pectoris, unspecified: Secondary | ICD-10-CM

## 2023-08-11 DIAGNOSIS — Z720 Tobacco use: Secondary | ICD-10-CM | POA: Diagnosis not present

## 2023-08-11 DIAGNOSIS — G250 Essential tremor: Secondary | ICD-10-CM

## 2023-08-11 LAB — POCT GLYCOSYLATED HEMOGLOBIN (HGB A1C): Hemoglobin A1C: 6.3 % — AB (ref 4.0–5.6)

## 2023-08-11 NOTE — Assessment & Plan Note (Signed)
Clinically stable. Chronic and affecting quality of life Recommend Tylenol as needed.  Advised against NSAIDs

## 2023-08-11 NOTE — Assessment & Plan Note (Signed)
Continues to smoke. Cardiovascular and cancer risks associated with smoking discussed Smoking cessation advice given.

## 2023-08-11 NOTE — Assessment & Plan Note (Signed)
Stable.  No recent anginal episodes. Continues isosorbide mononitrate 15 mg daily

## 2023-08-11 NOTE — Progress Notes (Signed)
Troy Little 72 y.o.   Chief Complaint  Patient presents with   Follow-up    6 month f/u for DM. Patient c/o of constant headaches since April after his neck surgery.     HISTORY OF PRESENT ILLNESS: This is a 72 y.o. male here for 83-month follow-up of chronic medical conditions Also a [facility] patient. Had blood work recently 08/03/2023. Normal lipid profile, CBC, CMP, urinalysis, PSA Hemoglobin A1c at 6.6 Had neck surgery last April.  States he has been having almost daily headaches since.  Unable to take NSAIDs due to gastric problems.  BP Readings from Last 3 Encounters:  08/11/23 130/78  04/29/23 120/61  02/08/23 130/74   Lab Results  Component Value Date   HGBA1C 6.2 (A) 02/08/2023   Wt Readings from Last 3 Encounters:  08/11/23 151 lb 3.2 oz (68.6 kg)  04/29/23 150 lb (68 kg)  02/08/23 145 lb 6 oz (65.9 kg)     HPI   Prior to Admission medications   Medication Sig Start Date End Date Taking? Authorizing Provider  Alcohol Swabs (ALCOHOL PREP PAD) 70 % PADS USE PAD TO AFFECTED AREA AS DIRECTED 06/16/22  Yes [provider]  Alcohol Swabs (DROPSAFE ALCOHOL PREP) 70 % PADS APPLY TO AFFECTED AREA AS DIRECTED 05/12/23  Yes Krikor Willet, Eilleen Kempf, MD  amLODipine (NORVASC) 5 MG tablet Take 2.5 tablets by mouth daily. 07/08/22  Yes [provider]  aspirin EC 81 MG tablet Take 81 mg by mouth daily.   Yes [provider]  azelastine (OPTIVAR) 0.05 % ophthalmic solution Place 1 drop into both eyes 2 (two) times daily. 09/07/22  Yes Trong Gosling, Eilleen Kempf, MD  blood glucose meter kit and supplies Dispense based on patient and insurance preference. Use to test in the morning, noon, and bedtime as directed. (FOR ICD-10 E10.9, E11.9). 10/30/20  Yes [name], FNP  Carboxymethylcellulose Sod PF (REFRESH PLUS) 0.5 % SOLN Place 1 drop into both eyes in the morning, at noon, and at bedtime.   Yes [provider]  cetirizine (ZYRTEC) 10 MG  tablet TAKE 1 TABLET EVERY DAY 01/31/23  Yes Aydrian Halpin, Eilleen Kempf, MD  cyclobenzaprine (FLEXERIL) 10 MG tablet Take 1 tablet (10 mg total) by mouth 3 (three) times daily as needed for muscle spasms. 12/29/22  Yes Pool, Sherilyn Cooter, MD  diphenhydramine-acetaminophen (TYLENOL PM) 25-500 MG TABS tablet Take 1 tablet by mouth 2 (two) times daily as needed (pain/sleep).   Yes [provider]  Glucose 15 GM/32ML GEL TAKE 1 TUBE BY MOUTH AS DIRECTED BY YOUR PROVIDER AS NEEDED LOW GLUCOSE 06/16/22  Yes [provider]  isosorbide mononitrate (IMDUR) 30 MG 24 hr tablet Take 15 mg by mouth See admin instructions. Take 15 mg daily, may take a second 15 mg dose at night as needed for high blood pressure 06/16/22  Yes [provider]  ketotifen (ZADITOR) 0.035 % ophthalmic solution Place 1 drop into both eyes in the morning and at bedtime.   Yes [provider]  lisinopril (ZESTRIL) 40 MG tablet Take 1 tablet (40 mg total) by mouth daily. Overdue for Annual appt must see provider for future refills Patient taking differently: Take 20 mg by mouth daily. Overdue for Annual appt must see provider for future refills 07/14/20  Yes [name], FNP  metFORMIN (GLUCOPHAGE) 500 MG tablet Take 1 tablet (500 mg total) by mouth daily with breakfast. Per patient, he takes 1/4 tablet per day "when he needs it" Patient taking  differently: Take 250-500 mg by mouth daily with breakfast. 11/10/18  Yes [name], FNP  naproxen (NAPROSYN) 500 MG tablet Take 1 tablet (500 mg total) by mouth 2 (two) times daily. 04/29/23  Yes Derwood Kaplan, MD  Omega-3 Fatty Acids (FISH OIL) 1000 MG CAPS Take 2,000 mg by mouth 2 (two) times daily.   Yes [provider]  ondansetron (ZOFRAN-ODT) 8 MG disintegrating tablet Take 1 tablet (8 mg total) by mouth every 8 (eight) hours as needed for nausea. 04/29/23  Yes Derwood Kaplan, MD  oxyCODONE-acetaminophen (PERCOCET/ROXICET) 5-325 MG tablet Take  1 tablet by mouth every 8 (eight) hours as needed for severe pain. 04/29/23  Yes Nanavati, Ankit, MD  pantoprazole (PROTONIX) 40 MG tablet Take 40 mg by mouth 2 (two) times daily. 07/14/22  Yes [provider]  primidone (MYSOLINE) 50 MG tablet Take 1 tablet (50 mg total) by mouth 2 (two) times daily. Per patient, takes  1 1/2 tablet in the am and 1 tablet in the afternoon Patient taking differently: Take 75 mg by mouth 3 (three) times daily. 11/10/18  Yes [name], FNP  propranolol (INDERAL) 10 MG tablet Patient takes 1 1/2 tablet in the am, 1 in the afternoon, 1 in the evening Patient taking differently: Take 15 mg by mouth 3 (three) times daily. 11/10/18  Yes [name], FNP  simvastatin (ZOCOR) 20 MG tablet Take 20 mg by mouth at bedtime.    Yes [provider]  terbinafine (LAMISIL) 1 % cream Apply 1 Application topically 2 (two) times daily. 07/08/22  Yes [provider]  triamcinolone cream (KENALOG) 0.1 % Apply 1 application topically 2 (two) times daily. 04/25/20  Yes Myrlene Broker, MD  TRUE METRIX BLOOD GLUCOSE TEST test strip USE AS DIRECTED 07/09/23  Yes Kenston Longton, Eilleen Kempf, MD  TRUEplus Lancets 33G MISC USE PER INSTRUCTIONS. 06/23/23  Yes [name], MD  famotidine (PEPCID) 20 MG tablet TAKE 1 TABLET TWICE DAILY Patient not taking: Reported on 12/10/2022 07/22/21   [name], FNP    Allergies  Allergen Reactions   Prozac [Fluoxetine Hcl] Other (See Comments)    agitation   Dye Fdc Red [Red Dye #40 (Allura Red)] Rash    Patient Active Problem List   Diagnosis Date Noted   Cervical spondylosis with myelopathy and radiculopathy 12/28/2022   Depression, unspecified 08/10/2022   Thyroid lesion 08/10/2022   Left sided sciatica 03/15/2022   Current smoker 03/15/2022   Angina pectoris (HCC)    Hypertension associated with diabetes (HCC) 01/22/2019   Diastolic dysfunction 11/03/2015   Chronic neck pain  11/01/2015   TIA (transient ischemic attack) 10/31/2015   Dyslipidemia associated with type 2 diabetes mellitus (HCC) 10/31/2015   Anxiety and depression 10/31/2015   Tobacco use 10/31/2015   Essential tremor 10/31/2015    Past Medical History:  Diagnosis Date   Agoraphobia    Anxiety    Arthritis    Coronary artery disease    1 stent   Diabetes mellitus without complication (HCC)    Essential Tremor    GERD (gastroesophageal reflux disease)    Headache    Hepatitis 2012   pt states he had Hep C but was treated   History of hiatal hernia    Hypercholesteremia    Hypertension    MDD (major depressive disorder)    Psychosis (HCC)    Stroke (HCC)    "series of little mini strokes" per pt    Past  Surgical History:  Procedure Laterality Date   ANTERIOR CERVICAL DECOMPRESSION/DISCECTOMY FUSION 4 LEVELS N/A 12/28/2022   Procedure: CERVICAL THREE-FOUR, CERVICAL FOUR-FIVE, CERVICAL FIVE-SIX, CERVICAL SIX-SEVEN ANTERIOR CERVICAL DECOMPRESSION/DISCECTOMY FUSION;  Surgeon: [name], MD;  Location: [facility] OR;  Service: Neurosurgery;  Laterality: N/A;   CARDIAC CATHETERIZATION     CHOLECYSTECTOMY     CORONARY STENT INTERVENTION N/A 09/24/2019   Procedure: CORONARY STENT INTERVENTION;  Surgeon: Corky Crafts, MD;  Location: [facility] INVASIVE CV LAB;  Service: Cardiovascular;  Laterality: N/A;   LEFT HEART CATH N/A 09/24/2019   Procedure: Left Heart Cath;  Surgeon: Corky Crafts, MD;  Location: [facility] INVASIVE CV LAB;  Service: Cardiovascular;  Laterality: N/A;    Social History   Socioeconomic History   Marital status: Married    Spouse name: Marylene Land   Number of children: 1   Years of education: 12   Highest education level: Some college, no degree  Occupational History   Occupation: Disabled  Tobacco Use   Smoking status: Every Day    Current packs/day: 0.50    Average packs/day: 0.5 packs/day for 47.0 years (23.5 ttl pk-yrs)    Types: Cigarettes   Smokeless tobacco: Never    Tobacco comments:    Patient states he is not ready to quit smoking  Vaping Use   Vaping status: Never Used  Substance and Sexual Activity   Alcohol use: Not Currently   Drug use: Not Currently   Sexual activity: Not on file  Other Topics Concern   Not on file  Social History Narrative   Not on file   Social Determinants of Health   Financial Resource Strain: Low Risk  (07/30/2022)   Overall Financial Resource Strain (CARDIA)    Difficulty of Paying Living Expenses: Not hard at all  Food Insecurity: No Food Insecurity (07/30/2022)   Hunger Vital Sign    Worried About Running Out of Food in the Last Year: Never true    Ran Out of Food in the Last Year: Never true  Transportation Needs: No Transportation Needs (07/30/2022)   PRAPARE - Administrator, Civil Service (Medical): No    Lack of Transportation (Non-Medical): No  Physical Activity: Inactive (07/30/2022)   Exercise Vital Sign    Days of Exercise per Week: 0 days    Minutes of Exercise per Session: 0 min  Stress: Stress Concern Present (07/30/2022)   [employer] of Occupational Health - Occupational Stress Questionnaire    Feeling of Stress : To some extent  Social Connections: Moderately Isolated (07/30/2022)   Social Connection and Isolation Panel [NHANES]    Frequency of Communication with Friends and Family: More than three times a week    Frequency of Social Gatherings with Friends and Family: Once a week    Attends Religious Services: Never    Database administrator or Organizations: No    Attends Banker Meetings: Never    Marital Status: Married  Catering manager Violence: Not At Risk (07/30/2022)   Humiliation, Afraid, Rape, and Kick questionnaire    Fear of Current or Ex-Partner: No    Emotionally Abused: No    Physically Abused: No    Sexually Abused: No    Family History  Problem Relation Age of Onset   Cancer Mother      Review of Systems  Constitutional:  Negative.  Negative for chills and fever.  HENT: Negative.  Negative for congestion and sore throat.   Respiratory: Negative.  Negative  for cough and shortness of breath.   Cardiovascular: Negative.  Negative for chest pain and palpitations.  Gastrointestinal:  Negative for abdominal pain, diarrhea, nausea and vomiting.  Genitourinary: Negative.  Negative for dysuria and hematuria.  Skin: Negative.  Negative for rash.  Neurological:  Positive for headaches.  All other systems reviewed and are negative.   Vitals:   08/11/23 0853  BP: 130/78  Pulse: (!) 58  Temp: 98 F (36.7 C)  SpO2: 98%    Physical Exam Vitals reviewed.  Constitutional:      Appearance: Normal appearance.  HENT:     Head: Normocephalic.     Mouth/Throat:     Mouth: Mucous membranes are moist.     Pharynx: Oropharynx is clear.  Eyes:     Extraocular Movements: Extraocular movements intact.     Conjunctiva/sclera: Conjunctivae normal.     Pupils: Pupils are equal, round, and reactive to light.  Cardiovascular:     Rate and Rhythm: Normal rate and regular rhythm.     Pulses: Normal pulses.     Heart sounds: Normal heart sounds.  Pulmonary:     Effort: Pulmonary effort is normal.     Breath sounds: Normal breath sounds.  Abdominal:     Palpations: Abdomen is soft.     Tenderness: There is no abdominal tenderness.  Musculoskeletal:     Cervical back: No tenderness.  Lymphadenopathy:     Cervical: No cervical adenopathy.  Skin:    General: Skin is warm and dry.     Capillary Refill: Capillary refill takes less than 2 seconds.  Neurological:     General: No focal deficit present.     Mental Status: He is alert and oriented to person, place, and time.  Psychiatric:        Mood and Affect: Mood normal.        Behavior: Behavior normal.    Results for orders placed or performed in visit on 08/11/23 (from the past 24 hour(s))  POCT HgB A1C     Status: Abnormal   Collection Time: 08/11/23  9:32 AM   Result Value Ref Range   Hemoglobin A1C 6.3 (A) 4.0 - 5.6 %   HbA1c POC (<> result, manual entry)     HbA1c, POC (prediabetic range)     HbA1c, POC (controlled diabetic range)        ASSESSMENT & PLAN: A total of 43 minutes was spent with the patient and counseling/coordination of care regarding preparing for this visit, review of most recent office visit notes, review of multiple chronic medical conditions under management, review of most recent blood work results, review of all medications, cardiovascular risks associated with hypertension and dyslipidemia, smoking cessation advice, education on nutrition, review of health maintenance items, prognosis, documentation, and need for follow-up.  Problem List Items Addressed This Visit       Cardiovascular and Mediastinum   Hypertension associated with diabetes (HCC) - Primary    BP Readings from Last 3 Encounters:  08/11/23 130/78  04/29/23 120/61  02/08/23 130/74  Continue lisinopril 20 mg and amlodipine 5 mg daily Well-controlled diabetes with hemoglobin A1c of 6.3 Continue metformin 500 mg daily Cardiovascular risks associated with hypertension and diabetes discussed Diet and nutrition discussed Follow-up in 6 months       Angina pectoris (HCC)    Stable.  No recent anginal episodes. Continues isosorbide mononitrate 15 mg daily        Endocrine   Dyslipidemia associated with type 2  diabetes mellitus (HCC)    Chronic stable conditions Continue simvastatin 20 mg daily Diet and nutrition discussed        Nervous and Auditory   Essential tremor    Stable. Continues propranolol 10 mg 3 times a day        Other   Tobacco use    Continues to smoke. Cardiovascular and cancer risks associated with smoking discussed Smoking cessation advice given.       Recurrent headache    Clinically stable. Chronic and affecting quality of life Recommend Tylenol as needed.  Advised against NSAIDs      Patient Instructions   Health Maintenance After Age 81 After age 74, you are at a higher risk for certain long-term diseases and infections as well as injuries from falls. Falls are a major cause of broken bones and head injuries in people who are older than age 33. Getting regular preventive care can help to keep you healthy and well. Preventive care includes getting regular testing and making lifestyle changes as recommended by your health care provider. Talk with your health care provider about: Which screenings and tests you should have. A screening is a test that checks for a disease when you have no symptoms. A diet and exercise plan that is right for you. What should I know about screenings and tests to prevent falls? Screening and testing are the best ways to find a health problem early. Early diagnosis and treatment give you the best chance of managing medical conditions that are common after age 24. Certain conditions and lifestyle choices may make you more likely to have a fall. Your health care provider may recommend: Regular vision checks. Poor vision and conditions such as cataracts can make you more likely to have a fall. If you wear glasses, make sure to get your prescription updated if your vision changes. Medicine review. Work with your health care provider to regularly review all of the medicines you are taking, including over-the-counter medicines. Ask your health care provider about any side effects that may make you more likely to have a fall. Tell your health care provider if any medicines that you take make you feel dizzy or sleepy. Strength and balance checks. Your health care provider may recommend certain tests to check your strength and balance while standing, walking, or changing positions. Foot health exam. Foot pain and numbness, as well as not wearing proper footwear, can make you more likely to have a fall. Screenings, including: Osteoporosis screening. Osteoporosis is a condition that causes  the bones to get weaker and break more easily. Blood pressure screening. Blood pressure changes and medicines to control blood pressure can make you feel dizzy. Depression screening. You may be more likely to have a fall if you have a fear of falling, feel depressed, or feel unable to do activities that you used to do. Alcohol use screening. Using too much alcohol can affect your balance and may make you more likely to have a fall. Follow these instructions at home: Lifestyle Do not drink alcohol if: Your health care provider tells you not to drink. If you drink alcohol: Limit how much you have to: 0-1 drink a day for women. 0-2 drinks a day for men. Know how much alcohol is in your drink. In the [place] one drink equals one 12 oz bottle of beer (355 mL), one 5 oz glass of wine (148 mL), or one 1 oz glass of hard liquor (44 mL). Do not use any products  that contain nicotine or tobacco. These products include cigarettes, chewing tobacco, and vaping devices, such as e-cigarettes. If you need help quitting, ask your health care provider. Activity  Follow a regular exercise program to stay fit. This will help you maintain your balance. Ask your health care provider what types of exercise are appropriate for you. If you need a cane or walker, use it as recommended by your health care provider. Wear supportive shoes that have nonskid soles. Safety  Remove any tripping hazards, such as rugs, cords, and clutter. Install safety equipment such as grab bars in bathrooms and safety rails on stairs. Keep rooms and walkways well-lit. General instructions Talk with your health care provider about your risks for falling. Tell your health care provider if: You fall. Be sure to tell your health care provider about all falls, even ones that seem minor. You feel dizzy, tiredness (fatigue), or off-balance. Take over-the-counter and prescription medicines only as told by your health care provider. These include  supplements. Eat a healthy diet and maintain a healthy weight. A healthy diet includes low-fat dairy products, low-fat (lean) meats, and fiber from whole grains, beans, and lots of fruits and vegetables. Stay current with your vaccines. Schedule regular health, dental, and eye exams. Summary Having a healthy lifestyle and getting preventive care can help to protect your health and wellness after age 51. Screening and testing are the best way to find a health problem early and help you avoid having a fall. Early diagnosis and treatment give you the best chance for managing medical conditions that are more common for people who are older than age 32. Falls are a major cause of broken bones and head injuries in people who are older than age 14. Take precautions to prevent a fall at home. Work with your health care provider to learn what changes you can make to improve your health and wellness and to prevent falls. This information is not intended to replace advice given to you by your health care provider. Make sure you discuss any questions you have with your health care provider. Document Revised: 01/26/2021 Document Reviewed: 01/26/2021 Elsevier Patient Education  2024 Elsevier Inc.      Edwina Barth, MD [facility]

## 2023-08-11 NOTE — Patient Instructions (Signed)
Health Maintenance After Age 70 After age 70, you are at a higher risk for certain long-term diseases and infections as well as injuries from falls. Falls are a major cause of broken bones and head injuries in people who are older than age 70. Getting regular preventive care can help to keep you healthy and well. Preventive care includes getting regular testing and making lifestyle changes as recommended by your health care provider. Talk with your health care provider about: Which screenings and tests you should have. A screening is a test that checks for a disease when you have no symptoms. A diet and exercise plan that is right for you. What should I know about screenings and tests to prevent falls? Screening and testing are the best ways to find a health problem early. Early diagnosis and treatment give you the best chance of managing medical conditions that are common after age 70. Certain conditions and lifestyle choices may make you more likely to have a fall. Your health care provider may recommend: Regular vision checks. Poor vision and conditions such as cataracts can make you more likely to have a fall. If you wear glasses, make sure to get your prescription updated if your vision changes. Medicine review. Work with your health care provider to regularly review all of the medicines you are taking, including over-the-counter medicines. Ask your health care provider about any side effects that may make you more likely to have a fall. Tell your health care provider if any medicines that you take make you feel dizzy or sleepy. Strength and balance checks. Your health care provider may recommend certain tests to check your strength and balance while standing, walking, or changing positions. Foot health exam. Foot pain and numbness, as well as not wearing proper footwear, can make you more likely to have a fall. Screenings, including: Osteoporosis screening. Osteoporosis is a condition that causes  the bones to get weaker and break more easily. Blood pressure screening. Blood pressure changes and medicines to control blood pressure can make you feel dizzy. Depression screening. You may be more likely to have a fall if you have a fear of falling, feel depressed, or feel unable to do activities that you used to do. Alcohol use screening. Using too much alcohol can affect your balance and may make you more likely to have a fall. Follow these instructions at home: Lifestyle Do not drink alcohol if: Your health care provider tells you not to drink. If you drink alcohol: Limit how much you have to: 0-1 drink a day for women. 0-2 drinks a day for men. Know how much alcohol is in your drink. In the [place] one drink equals one 12 oz bottle of beer (355 mL), one 5 oz glass of wine (148 mL), or one 1 oz glass of hard liquor (44 mL). Do not use any products that contain nicotine or tobacco. These products include cigarettes, chewing tobacco, and vaping devices, such as e-cigarettes. If you need help quitting, ask your health care provider. Activity  Follow a regular exercise program to stay fit. This will help you maintain your balance. Ask your health care provider what types of exercise are appropriate for you. If you need a cane or walker, use it as recommended by your health care provider. Wear supportive shoes that have nonskid soles. Safety  Remove any tripping hazards, such as rugs, cords, and clutter. Install safety equipment such as grab bars in bathrooms and safety rails on stairs. Keep rooms and walkways   well-lit. General instructions Talk with your health care provider about your risks for falling. Tell your health care provider if: You fall. Be sure to tell your health care provider about all falls, even ones that seem minor. You feel dizzy, tiredness (fatigue), or off-balance. Take over-the-counter and prescription medicines only as told by your health care provider. These include  supplements. Eat a healthy diet and maintain a healthy weight. A healthy diet includes low-fat dairy products, low-fat (lean) meats, and fiber from whole grains, beans, and lots of fruits and vegetables. Stay current with your vaccines. Schedule regular health, dental, and eye exams. Summary Having a healthy lifestyle and getting preventive care can help to protect your health and wellness after age 70. Screening and testing are the best way to find a health problem early and help you avoid having a fall. Early diagnosis and treatment give you the best chance for managing medical conditions that are more common for people who are older than age 70. Falls are a major cause of broken bones and head injuries in people who are older than age 70. Take precautions to prevent a fall at home. Work with your health care provider to learn what changes you can make to improve your health and wellness and to prevent falls. This information is not intended to replace advice given to you by your health care provider. Make sure you discuss any questions you have with your health care provider. Document Revised: 01/26/2021 Document Reviewed: 01/26/2021 Elsevier Patient Education  2024 Elsevier Inc.  

## 2023-08-11 NOTE — Assessment & Plan Note (Signed)
Stable. Continues propranolol 10 mg 3 times a day

## 2023-08-11 NOTE — Assessment & Plan Note (Signed)
BP Readings from Last 3 Encounters:  08/11/23 130/78  04/29/23 120/61  02/08/23 130/74  Continue lisinopril 20 mg and amlodipine 5 mg daily Well-controlled diabetes with hemoglobin A1c of 6.3 Continue metformin 500 mg daily Cardiovascular risks associated with hypertension and diabetes discussed Diet and nutrition discussed Follow-up in 6 months

## 2023-08-11 NOTE — Assessment & Plan Note (Signed)
Chronic stable conditions Continue simvastatin 20 mg daily Diet and nutrition discussed

## 2023-10-03 ENCOUNTER — Encounter: Payer: Self-pay | Admitting: Emergency Medicine

## 2023-12-01 ENCOUNTER — Ambulatory Visit (INDEPENDENT_AMBULATORY_CARE_PROVIDER_SITE_OTHER)

## 2023-12-01 VITALS — Ht 68.0 in | Wt 151.0 lb

## 2023-12-01 DIAGNOSIS — Z0000 Encounter for general adult medical examination without abnormal findings: Secondary | ICD-10-CM | POA: Diagnosis not present

## 2023-12-01 NOTE — Progress Notes (Signed)
 Subjective:  Please attest and cosign this visit due to patients primary care provider not being in the office at the time the visit was completed.  (Pt of Dr Troy Little)   Troy Little is a 73 y.o. who presents for a Medicare Wellness preventive visit.  Visit Complete: Virtual I connected with  Troy Little on 12/01/23 by a audio enabled telemedicine application and verified that I am speaking with the correct person using two identifiers.  Patient Location: Home  Provider Location: Office/Clinic  I discussed the limitations of evaluation and management by telemedicine. The patient expressed understanding and agreed to proceed.  Vital Signs: Because this visit was a virtual/telehealth visit, some criteria may be missing or patient reported. Any vitals not documented were not able to be obtained and vitals that have been documented are patient reported.  VideoDeclined- This patient declined Librarian, academic. Therefore the visit was completed with audio only.  AWV Questionnaire: No: Patient Medicare AWV questionnaire was not completed prior to this visit.  Cardiac Risk Factors include: advanced age (>26men, >26 women);diabetes mellitus;dyslipidemia;male gender;hypertension;smoking/ tobacco exposure     Objective:    Today's Vitals   12/01/23 0925  Weight: 151 lb (68.5 kg)  Height: 5\' 8"  (1.727 m)   Body mass index is 22.96 kg/m.     12/01/2023    9:22 AM 04/29/2023    5:56 AM 12/15/2022   10:17 AM 07/30/2022   11:53 AM 09/24/2019    8:55 AM 08/31/2019    6:58 PM 05/06/2017   10:53 AM  Advanced Directives  Does Patient Have a Medical Advance Directive? No No No No No No No  Would patient like information on creating a medical advance directive? No - Patient declined No - Patient declined No - Patient declined No - Patient declined No - Patient declined      Current Medications (verified) Outpatient Encounter Medications as of 12/01/2023   Medication Sig   Alcohol Swabs (ALCOHOL PREP PAD) 70 % PADS USE PAD TO AFFECTED AREA AS DIRECTED   Alcohol Swabs (DROPSAFE ALCOHOL PREP) 70 % PADS APPLY TO AFFECTED AREA AS DIRECTED   amLODipine (NORVASC) 5 MG tablet Take 2.5 tablets by mouth daily.   aspirin EC 81 MG tablet Take 81 mg by mouth daily.   azelastine (OPTIVAR) 0.05 % ophthalmic solution Place 1 drop into both eyes 2 (two) times daily.   blood glucose meter kit and supplies Dispense based on patient and insurance preference. Use to test in the morning, noon, and bedtime as directed. (FOR ICD-10 E10.9, E11.9).   Carboxymethylcellulose Sod PF (REFRESH PLUS) 0.5 % SOLN Place 1 drop into both eyes in the morning, at noon, and at bedtime.   cetirizine (ZYRTEC) 10 MG tablet TAKE 1 TABLET EVERY DAY   cyclobenzaprine (FLEXERIL) 10 MG tablet Take 1 tablet (10 mg total) by mouth 3 (three) times daily as needed for muscle spasms.   diphenhydramine-acetaminophen (TYLENOL PM) 25-500 MG TABS tablet Take 1 tablet by mouth 2 (two) times daily as needed (pain/sleep).   famotidine (PEPCID) 20 MG tablet TAKE 1 TABLET TWICE DAILY   Glucose 15 GM/32ML GEL TAKE 1 TUBE BY MOUTH AS DIRECTED BY YOUR PROVIDER AS NEEDED LOW GLUCOSE   isosorbide mononitrate (IMDUR) 30 MG 24 hr tablet Take 15 mg by mouth See admin instructions. Take 15 mg daily, may take a second 15 mg dose at night as needed for high blood pressure   ketotifen (ZADITOR) 0.035 %  ophthalmic solution Place 1 drop into both eyes in the morning and at bedtime.   lisinopril (ZESTRIL) 40 MG tablet Take 1 tablet (40 mg total) by mouth daily. Overdue for Annual appt must see provider for future refills (Patient taking differently: Take 20 mg by mouth daily. Overdue for Annual appt must see provider for future refills)   metFORMIN (GLUCOPHAGE) 500 MG tablet Take 1 tablet (500 mg total) by mouth daily with breakfast. Per patient, he takes 1/4 tablet per day "when he needs it" (Patient taking differently:  Take 250-500 mg by mouth daily with breakfast.)   naproxen (NAPROSYN) 500 MG tablet Take 1 tablet (500 mg total) by mouth 2 (two) times daily.   Omega-3 Fatty Acids (FISH OIL) 1000 MG CAPS Take 2,000 mg by mouth 2 (two) times daily.   ondansetron (ZOFRAN-ODT) 8 MG disintegrating tablet Take 1 tablet (8 mg total) by mouth every 8 (eight) hours as needed for nausea.   pantoprazole (PROTONIX) 40 MG tablet Take 40 mg by mouth 2 (two) times daily.   primidone (MYSOLINE) 50 MG tablet Take 1 tablet (50 mg total) by mouth 2 (two) times daily. Per patient, takes  1 1/2 tablet in the am and 1 tablet in the afternoon (Patient taking differently: Take 75 mg by mouth 3 (three) times daily.)   propranolol (INDERAL) 10 MG tablet Patient takes 1 1/2 tablet in the am, 1 in the afternoon, 1 in the evening (Patient taking differently: Take 15 mg by mouth 3 (three) times daily.)   simvastatin (ZOCOR) 20 MG tablet Take 20 mg by mouth at bedtime.    terbinafine (LAMISIL) 1 % cream Apply 1 Application topically 2 (two) times daily.   triamcinolone cream (KENALOG) 0.1 % Apply 1 application topically 2 (two) times daily.   TRUE METRIX BLOOD GLUCOSE TEST test strip USE AS DIRECTED   TRUEplus Lancets 33G MISC USE PER INSTRUCTIONS.   [DISCONTINUED] oxyCODONE-acetaminophen (PERCOCET/ROXICET) 5-325 MG tablet Take 1 tablet by mouth every 8 (eight) hours as needed for severe pain.   No facility-administered encounter medications on file as of 12/01/2023.    Allergies (verified) Prozac [fluoxetine hcl] and Dye fdc red [red dye #40 (allura red)]   History: Past Medical History:  Diagnosis Date   Agoraphobia    Anxiety    Arthritis    Coronary artery disease    1 stent   Diabetes mellitus without complication (HCC)    Essential Tremor    GERD (gastroesophageal reflux disease)    Headache    Hepatitis 2012   pt states he had Hep C but was treated   History of hiatal hernia    Hypercholesteremia    Hypertension     MDD (major depressive disorder)    Psychosis (HCC)    Stroke (HCC)    "series of little mini strokes" per pt   Past Surgical History:  Procedure Laterality Date   ANTERIOR CERVICAL DECOMPRESSION/DISCECTOMY FUSION 4 LEVELS N/A 12/28/2022   Procedure: CERVICAL THREE-FOUR, CERVICAL FOUR-FIVE, CERVICAL FIVE-SIX, CERVICAL SIX-SEVEN ANTERIOR CERVICAL DECOMPRESSION/DISCECTOMY FUSION;  Surgeon: [name], MD;  Location: [facility] OR;  Service: Neurosurgery;  Laterality: N/A;   CARDIAC CATHETERIZATION     CHOLECYSTECTOMY     CORONARY STENT INTERVENTION N/A 09/24/2019   Procedure: CORONARY STENT INTERVENTION;  Surgeon: Corky Crafts, MD;  Location: [facility] INVASIVE CV LAB;  Service: Cardiovascular;  Laterality: N/A;   LEFT HEART CATH N/A 09/24/2019   Procedure: Left Heart Cath;  Surgeon: Corky Crafts, MD;  Location: [facility] INVASIVE CV LAB;  Service: Cardiovascular;  Laterality: N/A;   Family History  Problem Relation Age of Onset   Cancer Mother    Social History   Socioeconomic History   Marital status: Married    Spouse name: Marylene Land   Number of children: 1   Years of education: 12   Highest education level: Some college, no degree  Occupational History   Occupation: Disabled  Tobacco Use   Smoking status: Every Day    Current packs/day: 0.50    Average packs/day: 0.5 packs/day for 52.2 years (26.1 ttl pk-yrs)    Types: Cigarettes    Start date: 1973    Passive exposure: Current   Smokeless tobacco: Never   Tobacco comments:    Patient states he is not ready to quit smoking as of 12/01/2023.  Vaping Use   Vaping status: Never Used  Substance and Sexual Activity   Alcohol use: Not Currently   Drug use: Not Currently   Sexual activity: Yes  Other Topics Concern   Not on file  Social History Narrative   Married; Retired from [employer]   Current Smoker - Not ready to [employer] Drivers of [employer]: Low Risk  (12/01/2023)   Overall Financial Resource  Strain (CARDIA)    Difficulty of Paying Living Expenses: Not hard at all  Food Insecurity: No Food Insecurity (12/01/2023)   Hunger Vital Sign    Worried About Running Out of Food in the Last Year: Never true    Ran Out of Food in the Last Year: Never true  Transportation Needs: No Transportation Needs (12/01/2023)   PRAPARE - Administrator, Civil Service (Medical): No    Lack of Transportation (Non-Medical): No  Physical Activity: Insufficiently Active (12/01/2023)   Exercise Vital Sign    Days of Exercise per Week: 3 days    Minutes of Exercise per Session: 30 min  Stress: No Stress Concern Present (12/01/2023)   [employer] of Occupational Health - Occupational Stress Questionnaire    Feeling of Stress : Not at all  Social Connections: Moderately Isolated (12/01/2023)   Social Connection and Isolation Panel [NHANES]    Frequency of Communication with Friends and Family: More than three times a week    Frequency of Social Gatherings with Friends and Family: More than three times a week    Attends Religious Services: Never    Database administrator or Organizations: No    Attends Banker Meetings: Never    Marital Status: Married    Tobacco Counseling - Current Smoker Ready to quit: No Counseling given: Yes Tobacco comments: Patient states he is not ready to quit smoking as of 12/01/2023.    Clinical Intake:  Pre-visit preparation completed: Yes  Pain : No/denies pain     BMI - recorded: 22.96 Nutritional Status: BMI of 19-24  Normal Diabetes: Yes CBG done?: Yes CBG resulted in Enter/ Edit results?: Yes (Fasting - 140 this morning) Did pt. bring in CBG monitor from home?: No  How often do you need to have someone help you when you read instructions, pamphlets, or other written materials from your doctor or pharmacy?: 1 - Never  Interpreter Needed?: No  Information entered by :: Hassell Halim, CMA   Activities of Daily Living      12/01/2023    9:28 AM 12/15/2022   10:23 AM  In your present state of health, do you have any  difficulty performing the following activities:  Hearing? 0   Vision? 0   Difficulty concentrating or making decisions? 0   Walking or climbing stairs? 0   Dressing or bathing? 0   Doing errands, shopping? 0 0  Preparing Food and eating ? N   Using the Toilet? N   In the past six months, have you accidently leaked urine? N   Do you have problems with loss of bowel control? N   Managing your Medications? N   Managing your Finances? N   Housekeeping or managing your Housekeeping? N     Patient Care Team: [name], MD as PCP - General (Internal Medicine) Corky Crafts, MD as PCP - Cardiology (Cardiology)  Indicate any recent Medical Services you may have received from other than [facility] providers in the past year (date may be approximate).     Assessment:   This is a routine wellness examination for Morton.  Hearing/Vision screen Hearing Screening - Comments:: Denies hearing difficulties   Vision Screening - Comments:: Wears rx glasses - up to date with routine eye exams with the [facility] in [place], [place]   Goals Addressed               This Visit's Progress     Patient Stated (pt-stated)        Patient stated that he plans to have an Ultrasound of kidneys and stay active.       Depression Screen     12/01/2023    9:31 AM 08/11/2023    8:59 AM 02/08/2023   10:02 AM 09/07/2022    9:47 AM 08/10/2022    9:54 AM 07/30/2022   11:45 AM 06/15/2022    2:35 PM  PHQ 2/9 Scores  PHQ - 2 Score 0 0 0 0 0 2 0  PHQ- 9 Score 0    0 7     Fall Risk     12/01/2023    9:32 AM 08/11/2023    8:59 AM 02/08/2023   10:02 AM 09/07/2022    9:47 AM 08/10/2022    9:52 AM  Fall Risk   Falls in the past year? 0 0 0 0 0  Number falls in past yr: 0 0 0 0 0  Injury with Fall? 0 0 0 0 0  Risk for fall due to : No Fall Risks No Fall Risks No Fall Risks No Fall Risks No Fall  Risks  Follow up Falls prevention discussed;Falls evaluation completed Falls evaluation completed Falls evaluation completed Falls evaluation completed Falls evaluation completed    MEDICARE RISK AT HOME:  Medicare Risk at Home Any stairs in or around the home?: Yes If so, are there any without handrails?: No Home free of loose throw rugs in walkways, pet beds, electrical cords, etc?: Yes Adequate lighting in your home to reduce risk of falls?: Yes Life alert?: No Use of a cane, walker or w/c?: Yes (cane) Grab bars in the bathroom?: No Shower chair or bench in shower?: No Elevated toilet seat or a handicapped toilet?: No  TIMED UP AND GO:  Was the test performed?  No  Cognitive Function: 6CIT completed        12/01/2023    9:33 AM 07/30/2022    9:43 AM  6CIT Screen  What Year? 0 points 0 points  What month? 0 points 0 points  What time? 0 points 0 points  Count back from 20 0 points 0 points  Months in reverse 0  points 0 points  Repeat phrase 2 points 0 points  Total Score 2 points 0 points    Immunizations Immunization History  Administered Date(s) Administered   DTP 10/09/2010   Fluad Quad(high Dose 65+) 07/17/2020   Hepatitis B, ADULT 10/17/2000, 10/29/2002   Influenza-Unspecified 06/20/2001, 07/12/2001   Moderna Covid-19 Vaccine Bivalent Booster 68yrs & up 11/25/2021   Moderna Sars-Covid-2 Vaccination 10/02/2019, 11/02/2019, 11/30/2019, 08/21/2020   Pneumococcal Polysaccharide-23 09/20/2000   Pneumococcal-Unspecified 04/24/1997   Td (Adult),5 Lf Tetanus Toxid, Preservative Free 07/08/2022   Tdap 10/09/2010, 03/17/2021    Screening Tests Health Maintenance  Topic Date Due   FOOT EXAM  Never done   Pneumonia Vaccine 35+ Years old (2 of 2 - PCV) 09/20/2001   Zoster Vaccines- Shingrix (1 of 2) Never done   Lung Cancer Screening  11/16/2019   COVID-19 Vaccine (6 - 2024-25 season) 05/22/2023   INFLUENZA VACCINE  12/19/2023 (Originally 04/21/2023)   HEMOGLOBIN  A1C  02/08/2024   OPHTHALMOLOGY EXAM  05/29/2024   Diabetic kidney evaluation - eGFR measurement  08/02/2024   Diabetic kidney evaluation - Urine ACR  08/02/2024   Medicare Annual Wellness (AWV)  11/30/2024   DTaP/Tdap/Td (5 - Td or Tdap) 07/08/2032   Colonoscopy  07/19/2033   Hepatitis C Screening  Completed   HPV VACCINES  Aged Out    Health Maintenance  Health Maintenance Due  Topic Date Due   FOOT EXAM  Never done   Pneumonia Vaccine 53+ Years old (2 of 2 - PCV) 09/20/2001   Zoster Vaccines- Shingrix (1 of 2) Never done   Lung Cancer Screening  11/16/2019   COVID-19 Vaccine (6 - 2024-25 season) 05/22/2023   Health Maintenance Items Addressed: 12/01/2023 Lung Cancer Screening status: Pt stated has had a Lung test at the [facility] in [place], [place] (asked to get report for PCP).   Additional Screening:  Vision Screening: Recommended annual ophthalmology exams for early detection of glaucoma and other disorders of the eye.  Pt states sees an Ophthalmologist at the [facility] in [place], [place].  Dental Screening: Recommended annual dental exams for proper oral hygiene. Pt states sees a Education officer, community at the [facility] in [place], [place]  Community Resource Referral / Chronic Care Management: CRR required this visit?  No   CCM required this visit?  No     Plan:     I have personally reviewed and noted the following in the patient's chart:   Medical and social history Use of alcohol, tobacco or illicit drugs  Current medications and supplements including opioid prescriptions. Patient is not currently taking opioid prescriptions. Functional ability and status Nutritional status Physical activity Advanced directives List of other physicians Hospitalizations, surgeries, and ER visits in previous 12 months Vitals Screenings to include cognitive, depression, and falls Referrals and appointments  In addition, I have reviewed and discussed with patient certain  preventive protocols, quality metrics, and best practice recommendations. A written personalized care plan for preventive services as well as general preventive health recommendations were provided to patient.     Darreld Mclean, CMA   12/01/2023   After Visit Summary: (MyChart) Due to this being a telephonic visit, the after visit summary with patients personalized plan was offered to patient via MyChart   Notes:  Pt stated he's had a Lung Cancer Screening test at the [facility] in 2024 therefore I've advised him to get a copy of the report and bring to PCP.  Also advised and educated on getting the  Pneumonia and Shingles vaccines at the [facility] when he goes in 12/2023.

## 2023-12-01 NOTE — Patient Instructions (Addendum)
 Troy Little , Thank you for taking time to come for your Medicare Wellness Visit. I appreciate your ongoing commitment to your health goals. Please review the following plan we discussed and let me know if I can assist you in the future.   Referrals/Orders/Follow-Ups/Clinician Recommendations: Aim for 30 minutes of exercise or brisk walking, 6-8 glasses of water, and 5 servings of fruits and vegetables each day. Pt has been advised to get records from the [facility] including recent Lung Screening test and immunizations for his PCP.  This is a list of the screening recommended for you and due dates:  Health Maintenance  Topic Date Due   Complete foot exam   Never done   Pneumonia Vaccine (2 of 2 - PCV) 09/20/2001   Zoster (Shingles) Vaccine (1 of 2) Never done   Screening for Lung Cancer  11/16/2019   COVID-19 Vaccine (6 - 2024-25 season) 05/22/2023   Flu Shot  12/19/2023*   Hemoglobin A1C  02/08/2024   Eye exam for diabetics  05/29/2024   Yearly kidney function blood test for diabetes  08/02/2024   Yearly kidney health urinalysis for diabetes  08/02/2024   Medicare Annual Wellness Visit  11/30/2024   DTaP/Tdap/Td vaccine (5 - Td or Tdap) 07/08/2032   Colon Cancer Screening  07/19/2033   Hepatitis C Screening  Completed   HPV Vaccine  Aged Out  *Topic was postponed. The date shown is not the original due date.    Advanced directives: (Declined) Advance directive discussed with you today. Even though you declined this today, please call our office should you change your mind, and we can give you the proper paperwork for you to fill out.  Next Medicare Annual Wellness Visit scheduled for next year: Yes

## 2024-02-08 ENCOUNTER — Encounter: Payer: Self-pay | Admitting: Emergency Medicine

## 2024-02-08 ENCOUNTER — Ambulatory Visit (INDEPENDENT_AMBULATORY_CARE_PROVIDER_SITE_OTHER): Payer: Medicare PPO | Admitting: Emergency Medicine

## 2024-02-08 VITALS — BP 122/78 | HR 55 | Temp 98.4°F | Ht 68.0 in | Wt 152.0 lb

## 2024-02-08 DIAGNOSIS — I152 Hypertension secondary to endocrine disorders: Secondary | ICD-10-CM

## 2024-02-08 DIAGNOSIS — E1169 Type 2 diabetes mellitus with other specified complication: Secondary | ICD-10-CM | POA: Diagnosis not present

## 2024-02-08 DIAGNOSIS — F17200 Nicotine dependence, unspecified, uncomplicated: Secondary | ICD-10-CM

## 2024-02-08 DIAGNOSIS — E785 Hyperlipidemia, unspecified: Secondary | ICD-10-CM

## 2024-02-08 DIAGNOSIS — I209 Angina pectoris, unspecified: Secondary | ICD-10-CM

## 2024-02-08 DIAGNOSIS — E1159 Type 2 diabetes mellitus with other circulatory complications: Secondary | ICD-10-CM

## 2024-02-08 DIAGNOSIS — Z7984 Long term (current) use of oral hypoglycemic drugs: Secondary | ICD-10-CM

## 2024-02-08 DIAGNOSIS — Z23 Encounter for immunization: Secondary | ICD-10-CM

## 2024-02-08 NOTE — Progress Notes (Signed)
 Troy Little 73 y.o.   Chief Complaint  Patient presents with   Follow-up    6 month f/u HTN/ DM. Patient mentions having a colonoscopy and endoscopy done 4-5 months ago results were sent to him but nothing was explained to him, he did an ultra  sound but no results were given.  Patient has other questions but wants to speak to the provider about     HISTORY OF PRESENT ILLNESS: This is a 73 y.o. male here for 52-month follow-up of multiple chronic medical conditions Overall doing well. Colonoscopy done October 2024 within normal limits Recently had abdominal/kidney ultrasound.  No results available Goes to [place] system.  Recently also had blood work.  No results available No other complaints or medical concerns today.  HPI   Prior to Admission medications   Medication Sig Start Date End Date Taking? Authorizing Provider  Alcohol  Swabs (ALCOHOL  PREP PAD) 70 % PADS USE PAD TO AFFECTED AREA AS DIRECTED 06/16/22  Yes [provider]  Alcohol  Swabs (DROPSAFE ALCOHOL  PREP) 70 % PADS APPLY TO AFFECTED AREA AS DIRECTED 05/12/23  Yes Ireta Pullman, Isidro Margo, MD  amLODipine  (NORVASC ) 5 MG tablet Take 2.5 tablets by mouth daily. 07/08/22  Yes [provider]  aspirin  EC 81 MG tablet Take 81 mg by mouth daily.   Yes [provider]  azelastine  (OPTIVAR ) 0.05 % ophthalmic solution Place 1 drop into both eyes 2 (two) times daily. 09/07/22  Yes Dantrell Schertzer, Isidro Margo, MD  blood glucose meter kit and supplies Dispense based on patient and insurance preference. Use to test in the morning, noon, and bedtime as directed. (FOR ICD-10 E10.9, E11.9). 10/30/20  Yes Adra Alanis, FNP  Carboxymethylcellulose Sod PF (REFRESH PLUS) 0.5 % SOLN Place 1 drop into both eyes in the morning, at noon, and at bedtime.   Yes [provider]  cetirizine  (ZYRTEC ) 10 MG tablet TAKE 1 TABLET EVERY DAY 01/31/23  Yes Allana Shrestha, Isidro Margo, MD  diphenhydramine-acetaminophen  (TYLENOL  PM)  25-500 MG TABS tablet Take 1 tablet by mouth 2 (two) times daily as needed (pain/sleep).   Yes [provider]  famotidine  (PEPCID ) 20 MG tablet TAKE 1 TABLET TWICE DAILY 07/22/21  Yes Adra Alanis, FNP  Glucose 15 GM/32ML GEL TAKE 1 TUBE BY MOUTH AS DIRECTED BY YOUR PROVIDER AS NEEDED LOW GLUCOSE 06/16/22  Yes [provider]  isosorbide  mononitrate (IMDUR ) 30 MG 24 hr tablet Take 15 mg by mouth See admin instructions. Take 15 mg daily, may take a second 15 mg dose at night as needed for high blood pressure 06/16/22  Yes [provider]  ketotifen  (ZADITOR ) 0.035 % ophthalmic solution Place 1 drop into both eyes in the morning and at bedtime.   Yes [provider]  lisinopril  (ZESTRIL ) 40 MG tablet Take 1 tablet (40 mg total) by mouth daily. Overdue for Annual appt must see provider for future refills Patient taking differently: Take 20 mg by mouth daily. Overdue for Annual appt must see provider for future refills 07/14/20  Yes Adra Alanis, FNP  metFORMIN  (GLUCOPHAGE ) 500 MG tablet Take 1 tablet (500 mg total) by mouth daily with breakfast. Per patient, he takes 1/4 tablet per day "when he needs it" Patient taking differently: Take 250-500 mg by mouth daily with breakfast. 11/10/18  Yes Adra Alanis, FNP  Omega-3 Fatty Acids (FISH OIL ) 1000 MG CAPS Take 2,000 mg by mouth 2 (two) times daily.   Yes [provider]  pantoprazole  (PROTONIX )  40 MG tablet Take 40 mg by mouth 2 (two) times daily. 07/14/22  Yes [provider]  primidone  (MYSOLINE ) 50 MG tablet Take 1 tablet (50 mg total) by mouth 2 (two) times daily. Per patient, takes  1 1/2 tablet in the am and 1 tablet in the afternoon Patient taking differently: Take 75 mg by mouth 3 (three) times daily. 11/10/18  Yes Adra Alanis, FNP  propranolol  (INDERAL ) 10 MG tablet Patient takes 1 1/2 tablet in the am, 1 in the afternoon, 1 in the evening Patient taking  differently: Take 15 mg by mouth 3 (three) times daily. 11/10/18  Yes Adra Alanis, FNP  simvastatin  (ZOCOR ) 20 MG tablet Take 20 mg by mouth at bedtime.    Yes [provider]  terbinafine (LAMISIL) 1 % cream Apply 1 Application topically 2 (two) times daily. 07/08/22  Yes [provider]  triamcinolone  cream (KENALOG ) 0.1 % Apply 1 application topically 2 (two) times daily. 04/25/20  Yes Adelia Homestead, MD  TRUE METRIX BLOOD GLUCOSE TEST test strip USE AS DIRECTED 07/09/23  Yes Abbeygail Igoe, Isidro Margo, MD  TRUEplus Lancets 33G MISC USE PER INSTRUCTIONS. 06/23/23  Yes Rafaelita Foister, Isidro Margo, MD  cyclobenzaprine  (FLEXERIL ) 10 MG tablet Take 1 tablet (10 mg total) by mouth 3 (three) times daily as needed for muscle spasms. Patient not taking: Reported on 02/08/2024 12/29/22   Agustina Aldrich, MD  naproxen  (NAPROSYN ) 500 MG tablet Take 1 tablet (500 mg total) by mouth 2 (two) times daily. Patient not taking: Reported on 02/08/2024 04/29/23   Deatra Face, MD  ondansetron  (ZOFRAN -ODT) 8 MG disintegrating tablet Take 1 tablet (8 mg total) by mouth every 8 (eight) hours as needed for nausea. Patient not taking: Reported on 02/08/2024 04/29/23   Deatra Face, MD    Allergies  Allergen Reactions   Prozac [Fluoxetine Hcl] Other (See Comments)    agitation   Dye Fdc Red [Red Dye #40 (Allura Red)] Rash    Patient Active Problem List   Diagnosis Date Noted   Cervical spondylosis with myelopathy and radiculopathy 12/28/2022   Depression, unspecified 08/10/2022   Thyroid  lesion 08/10/2022   Recurrent headache 07/22/2022   Left sided sciatica 03/15/2022   Current smoker 03/15/2022   Angina pectoris (HCC)    Hypertension associated with diabetes (HCC) 01/22/2019   Diastolic dysfunction 11/03/2015   Chronic neck pain 11/01/2015   TIA (transient ischemic attack) 10/31/2015   Dyslipidemia associated with type 2 diabetes mellitus (HCC) 10/31/2015   Anxiety and depression  10/31/2015   Tobacco use 10/31/2015   Essential tremor 10/31/2015    Past Medical History:  Diagnosis Date   Agoraphobia    Anxiety    Arthritis    Coronary artery disease    1 stent   Diabetes mellitus without complication (HCC)    Essential Tremor    GERD (gastroesophageal reflux disease)    Headache    Hepatitis 2012   pt states he had Hep C but was treated   History of hiatal hernia    Hypercholesteremia    Hypertension    MDD (major depressive disorder)    Psychosis (HCC)    Stroke (HCC)    "series of little mini strokes" per pt    Past Surgical History:  Procedure Laterality Date   ANTERIOR CERVICAL DECOMPRESSION/DISCECTOMY FUSION 4 LEVELS N/A 12/28/2022   Procedure: CERVICAL THREE-FOUR, CERVICAL FOUR-FIVE, CERVICAL FIVE-SIX, CERVICAL SIX-SEVEN ANTERIOR CERVICAL DECOMPRESSION/DISCECTOMY FUSION;  Surgeon: Agustina Aldrich, MD;  Location: [facility] OR;  Service: Neurosurgery;  Laterality: N/A;   CARDIAC CATHETERIZATION     CHOLECYSTECTOMY     CORONARY STENT INTERVENTION N/A 09/24/2019   Procedure: CORONARY STENT INTERVENTION;  Surgeon: Lucendia Rusk, MD;  Location: [facility] INVASIVE CV LAB;  Service: Cardiovascular;  Laterality: N/A;   LEFT HEART CATH N/A 09/24/2019   Procedure: Left Heart Cath;  Surgeon: Lucendia Rusk, MD;  Location: [facility] INVASIVE CV LAB;  Service: Cardiovascular;  Laterality: N/A;    Social History   Socioeconomic History   Marital status: Married    Spouse name: Shelvy Dickens   Number of children: 1   Years of education: 12   Highest education level: Some college, no degree  Occupational History   Occupation: Disabled  Tobacco Use   Smoking status: Every Day    Current packs/day: 0.50    Average packs/day: 0.5 packs/day for 52.4 years (26.2 ttl pk-yrs)    Types: Cigarettes    Start date: 1973    Passive exposure: Current   Smokeless tobacco: Never   Tobacco comments:    Patient states he is not ready to quit smoking as of 12/01/2023.  Vaping Use    Vaping status: Never Used  Substance and Sexual Activity   Alcohol  use: Not Currently   Drug use: Not Currently   Sexual activity: Yes  Other Topics Concern   Not on file  Social History Narrative   Married; Retired from [employer]   Current Smoker - Not ready to [employer] Drivers of [employer]: Low Risk  (12/01/2023)   Overall Financial Resource Strain (CARDIA)    Difficulty of Paying Living Expenses: Not hard at all  Food Insecurity: No Food Insecurity (12/01/2023)   Hunger Vital Sign    Worried About Running Out of Food in the Last Year: Never true    Ran Out of Food in the Last Year: Never true  Transportation Needs: No Transportation Needs (12/01/2023)   PRAPARE - Administrator, Civil Service (Medical): No    Lack of Transportation (Non-Medical): No  Physical Activity: Insufficiently Active (12/01/2023)   Exercise Vital Sign    Days of Exercise per Week: 3 days    Minutes of Exercise per Session: 30 min  Stress: No Stress Concern Present (12/01/2023)   [employer] of Occupational Health - Occupational Stress Questionnaire    Feeling of Stress : Not at all  Social Connections: Moderately Isolated (12/01/2023)   Social Connection and Isolation Panel [NHANES]    Frequency of Communication with Friends and Family: More than three times a week    Frequency of Social Gatherings with Friends and Family: More than three times a week    Attends Religious Services: Never    Database administrator or Organizations: No    Attends Banker Meetings: Never    Marital Status: Married  Catering manager Violence: Not At Risk (12/01/2023)   Humiliation, Afraid, Rape, and Kick questionnaire    Fear of Current or Ex-Partner: No    Emotionally Abused: No    Physically Abused: No    Sexually Abused: No    Family History  Problem Relation Age of Onset   Cancer Mother      Review of Systems  Constitutional: Negative.  Negative for  chills and fever.  HENT: Negative.  Negative for congestion and sore throat.   Respiratory: Negative.  Negative for cough and shortness of breath.   Cardiovascular: Negative.  Negative for  chest pain and palpitations.  Gastrointestinal:  Negative for abdominal pain, diarrhea, nausea and vomiting.  Genitourinary: Negative.  Negative for dysuria and hematuria.  Skin: Negative.  Negative for rash.  Neurological: Negative.  Negative for dizziness and headaches.  All other systems reviewed and are negative.   Vitals:   02/08/24 0859  BP: 122/78  Pulse: (!) 55  Temp: 98.4 F (36.9 C)  SpO2: 98%    Physical Exam Vitals reviewed.  Constitutional:      Appearance: Normal appearance.  HENT:     Head: Normocephalic.     Mouth/Throat:     Mouth: Mucous membranes are moist.     Pharynx: Oropharynx is clear.  Eyes:     Extraocular Movements: Extraocular movements intact.     Pupils: Pupils are equal, round, and reactive to light.  Cardiovascular:     Rate and Rhythm: Normal rate and regular rhythm.     Pulses: Normal pulses.     Heart sounds: Normal heart sounds.  Pulmonary:     Effort: Pulmonary effort is normal.     Breath sounds: Normal breath sounds.  Abdominal:     Palpations: Abdomen is soft.     Tenderness: There is no abdominal tenderness.  Musculoskeletal:     Cervical back: No tenderness.  Lymphadenopathy:     Cervical: No cervical adenopathy.  Skin:    General: Skin is warm and dry.     Capillary Refill: Capillary refill takes less than 2 seconds.  Neurological:     General: No focal deficit present.     Mental Status: He is alert and oriented to person, place, and time.  Psychiatric:        Mood and Affect: Mood normal.        Behavior: Behavior normal.      ASSESSMENT & PLAN: A total of 44 minutes was spent with the patient and counseling/coordination of care regarding preparing for this visit, review of most recent office visit notes, review of multiple  chronic medical conditions and their management, cardiovascular risks associated with hypertension and diabetes, review of all medications, review of most recent bloodwork results, review of health maintenance items, education on nutrition, prognosis, documentation, and need for follow up.   Problem List Items Addressed This Visit       Cardiovascular and Mediastinum   Hypertension associated with diabetes (HCC) - Primary   Well-controlled hypertension  continue lisinopril  20 mg and amlodipine  5 mg daily Well-controlled diabetes with hemoglobin A1c of 6.3 Continue metformin  500 mg daily Cardiovascular risks associated with hypertension and diabetes discussed Diet and nutrition discussed Follow-up in 6 months      Angina pectoris (HCC)   Stable.  No recent anginal episodes. Continues isosorbide  mononitrate 15 mg daily        Endocrine   Dyslipidemia associated with type 2 diabetes mellitus (HCC)   Chronic stable conditions Continue simvastatin  20 mg daily Diet and nutrition discussed        Other   Current smoker   Cardiovascular and cancer risks associated with smoking discussed Cessation advice given Not ready to quit      Other Visit Diagnoses       Need for vaccination       Relevant Orders   Pneumococcal conjugate vaccine 20-valent (Prevnar 20)      Patient Instructions  Health Maintenance After Age 47 After age 41, you are at a higher risk for certain long-term diseases and infections as well as injuries from  falls. Falls are a major cause of broken bones and head injuries in people who are older than age 56. Getting regular preventive care can help to keep you healthy and well. Preventive care includes getting regular testing and making lifestyle changes as recommended by your health care provider. Talk with your health care provider about: Which screenings and tests you should have. A screening is a test that checks for a disease when you have no symptoms. A  diet and exercise plan that is right for you. What should I know about screenings and tests to prevent falls? Screening and testing are the best ways to find a health problem early. Early diagnosis and treatment give you the best chance of managing medical conditions that are common after age 26. Certain conditions and lifestyle choices may make you more likely to have a fall. Your health care provider may recommend: Regular vision checks. Poor vision and conditions such as cataracts can make you more likely to have a fall. If you wear glasses, make sure to get your prescription updated if your vision changes. Medicine review. Work with your health care provider to regularly review all of the medicines you are taking, including over-the-counter medicines. Ask your health care provider about any side effects that may make you more likely to have a fall. Tell your health care provider if any medicines that you take make you feel dizzy or sleepy. Strength and balance checks. Your health care provider may recommend certain tests to check your strength and balance while standing, walking, or changing positions. Foot health exam. Foot pain and numbness, as well as not wearing proper footwear, can make you more likely to have a fall. Screenings, including: Osteoporosis screening. Osteoporosis is a condition that causes the bones to get weaker and break more easily. Blood pressure screening. Blood pressure changes and medicines to control blood pressure can make you feel dizzy. Depression screening. You may be more likely to have a fall if you have a fear of falling, feel depressed, or feel unable to do activities that you used to do. Alcohol  use screening. Using too much alcohol  can affect your balance and may make you more likely to have a fall. Follow these instructions at home: Lifestyle Do not drink alcohol  if: Your health care provider tells you not to drink. If you drink alcohol : Limit how much you  have to: 0-1 drink a day for women. 0-2 drinks a day for men. Know how much alcohol  is in your drink. In the [place] one drink equals one 12 oz bottle of beer (355 mL), one 5 oz glass of wine (148 mL), or one 1 oz glass of hard liquor (44 mL). Do not use any products that contain nicotine  or tobacco. These products include cigarettes, chewing tobacco, and vaping devices, such as e-cigarettes. If you need help quitting, ask your health care provider. Activity  Follow a regular exercise program to stay fit. This will help you maintain your balance. Ask your health care provider what types of exercise are appropriate for you. If you need a cane or walker, use it as recommended by your health care provider. Wear supportive shoes that have nonskid soles. Safety  Remove any tripping hazards, such as rugs, cords, and clutter. Install safety equipment such as grab bars in bathrooms and safety rails on stairs. Keep rooms and walkways well-lit. General instructions Talk with your health care provider about your risks for falling. Tell your health care provider if: You fall. Be sure  to tell your health care provider about all falls, even ones that seem minor. You feel dizzy, tiredness (fatigue), or off-balance. Take over-the-counter and prescription medicines only as told by your health care provider. These include supplements. Eat a healthy diet and maintain a healthy weight. A healthy diet includes low-fat dairy products, low-fat (lean) meats, and fiber from whole grains, beans, and lots of fruits and vegetables. Stay current with your vaccines. Schedule regular health, dental, and eye exams. Summary Having a healthy lifestyle and getting preventive care can help to protect your health and wellness after age 50. Screening and testing are the best way to find a health problem early and help you avoid having a fall. Early diagnosis and treatment give you the best chance for managing medical conditions  that are more common for people who are older than age 9. Falls are a major cause of broken bones and head injuries in people who are older than age 97. Take precautions to prevent a fall at home. Work with your health care provider to learn what changes you can make to improve your health and wellness and to prevent falls. This information is not intended to replace advice given to you by your health care provider. Make sure you discuss any questions you have with your health care provider. Document Revised: 01/26/2021 Document Reviewed: 01/26/2021 Elsevier Patient Education  2024 Elsevier Inc.     Maryagnes Small, MD [facility]

## 2024-02-08 NOTE — Assessment & Plan Note (Signed)
Stable.  No recent anginal episodes. Continues isosorbide mononitrate 15 mg daily

## 2024-02-08 NOTE — Patient Instructions (Signed)
 Health Maintenance After Age 71 After age 4, you are at a higher risk for certain long-term diseases and infections as well as injuries from falls. Falls are a major cause of broken bones and head injuries in people who are older than age 47. Getting regular preventive care can help to keep you healthy and well. Preventive care includes getting regular testing and making lifestyle changes as recommended by your health care provider. Talk with your health care provider about: Which screenings and tests you should have. A screening is a test that checks for a disease when you have no symptoms. A diet and exercise plan that is right for you. What should I know about screenings and tests to prevent falls? Screening and testing are the best ways to find a health problem early. Early diagnosis and treatment give you the best chance of managing medical conditions that are common after age 37. Certain conditions and lifestyle choices may make you more likely to have a fall. Your health care provider may recommend: Regular vision checks. Poor vision and conditions such as cataracts can make you more likely to have a fall. If you wear glasses, make sure to get your prescription updated if your vision changes. Medicine review. Work with your health care provider to regularly review all of the medicines you are taking, including over-the-counter medicines. Ask your health care provider about any side effects that may make you more likely to have a fall. Tell your health care provider if any medicines that you take make you feel dizzy or sleepy. Strength and balance checks. Your health care provider may recommend certain tests to check your strength and balance while standing, walking, or changing positions. Foot health exam. Foot pain and numbness, as well as not wearing proper footwear, can make you more likely to have a fall. Screenings, including: Osteoporosis screening. Osteoporosis is a condition that causes  the bones to get weaker and break more easily. Blood pressure screening. Blood pressure changes and medicines to control blood pressure can make you feel dizzy. Depression screening. You may be more likely to have a fall if you have a fear of falling, feel depressed, or feel unable to do activities that you used to do. Alcohol use screening. Using too much alcohol can affect your balance and may make you more likely to have a fall. Follow these instructions at home: Lifestyle Do not drink alcohol if: Your health care provider tells you not to drink. If you drink alcohol: Limit how much you have to: 0-1 drink a day for women. 0-2 drinks a day for men. Know how much alcohol is in your drink. In the [place] one drink equals one 12 oz bottle of beer (355 mL), one 5 oz glass of wine (148 mL), or one 1 oz glass of hard liquor (44 mL). Do not use any products that contain nicotine or tobacco. These products include cigarettes, chewing tobacco, and vaping devices, such as e-cigarettes. If you need help quitting, ask your health care provider. Activity  Follow a regular exercise program to stay fit. This will help you maintain your balance. Ask your health care provider what types of exercise are appropriate for you. If you need a cane or walker, use it as recommended by your health care provider. Wear supportive shoes that have nonskid soles. Safety  Remove any tripping hazards, such as rugs, cords, and clutter. Install safety equipment such as grab bars in bathrooms and safety rails on stairs. Keep rooms and walkways  well-lit. General instructions Talk with your health care provider about your risks for falling. Tell your health care provider if: You fall. Be sure to tell your health care provider about all falls, even ones that seem minor. You feel dizzy, tiredness (fatigue), or off-balance. Take over-the-counter and prescription medicines only as told by your health care provider. These include  supplements. Eat a healthy diet and maintain a healthy weight. A healthy diet includes low-fat dairy products, low-fat (lean) meats, and fiber from whole grains, beans, and lots of fruits and vegetables. Stay current with your vaccines. Schedule regular health, dental, and eye exams. Summary Having a healthy lifestyle and getting preventive care can help to protect your health and wellness after age 11. Screening and testing are the best way to find a health problem early and help you avoid having a fall. Early diagnosis and treatment give you the best chance for managing medical conditions that are more common for people who are older than age 28. Falls are a major cause of broken bones and head injuries in people who are older than age 48. Take precautions to prevent a fall at home. Work with your health care provider to learn what changes you can make to improve your health and wellness and to prevent falls. This information is not intended to replace advice given to you by your health care provider. Make sure you discuss any questions you have with your health care provider. Document Revised: 01/26/2021 Document Reviewed: 01/26/2021 Elsevier Patient Education  2024 [employer].

## 2024-02-08 NOTE — Assessment & Plan Note (Signed)
 Well-controlled hypertension  continue lisinopril  20 mg and amlodipine  5 mg daily Well-controlled diabetes with hemoglobin A1c of 6.3 Continue metformin  500 mg daily Cardiovascular risks associated with hypertension and diabetes discussed Diet and nutrition discussed Follow-up in 6 months

## 2024-02-08 NOTE — Assessment & Plan Note (Signed)
Chronic stable conditions Continue simvastatin 20 mg daily Diet and nutrition discussed

## 2024-02-08 NOTE — Assessment & Plan Note (Signed)
 Cardiovascular and cancer risks associated with smoking discussed Cessation advice given Not ready to quit

## 2024-02-29 ENCOUNTER — Other Ambulatory Visit: Payer: Self-pay | Admitting: Emergency Medicine

## 2024-02-29 NOTE — Telephone Encounter (Signed)
 Copied from CRM [id number]. Topic: Clinical - Medication Refill >> Feb 29, 2024  4:02 PM Adonis Hoot wrote: Medication:  cetirizine  (ZYRTEC ) 10 MG tablet   Has the patient contacted their pharmacy? Yes (Agent: If no, request that the patient contact the pharmacy for the refill. If patient does not wish to contact the pharmacy document the reason why and proceed with request.) (Agent: If yes, when and what did the pharmacy advise?)  This is the patient's preferred pharmacy:    [facility] - [place], [place] - [address] [place] [address] Phone: [phone] Fax: [phone]  Is this the correct pharmacy for this prescription? Yes If no, delete pharmacy and type the correct one.   Has the prescription been filled recently? No  Is the patient out of the medication? Yes  Has the patient been seen for an appointment in the last year OR does the patient have an upcoming appointment? Yes  Can we respond through MyChart? Yes  Agent: Please be advised that Rx refills may take up to 3 business days. We ask that you follow-up with your pharmacy.

## 2024-03-01 MED ORDER — CETIRIZINE HCL 10 MG PO TABS: 10.0000 mg | ORAL_TABLET | Freq: Every day | ORAL | 3 refills | Status: AC

## 2024-04-02 ENCOUNTER — Ambulatory Visit (INDEPENDENT_AMBULATORY_CARE_PROVIDER_SITE_OTHER): Admitting: Family Medicine

## 2024-04-02 ENCOUNTER — Ambulatory Visit (INDEPENDENT_AMBULATORY_CARE_PROVIDER_SITE_OTHER)

## 2024-04-02 ENCOUNTER — Encounter: Payer: Self-pay | Admitting: Family Medicine

## 2024-04-02 ENCOUNTER — Other Ambulatory Visit: Payer: Self-pay

## 2024-04-02 VITALS — BP 122/74 | HR 62 | Ht 68.0 in | Wt 154.0 lb

## 2024-04-02 DIAGNOSIS — M5416 Radiculopathy, lumbar region: Secondary | ICD-10-CM

## 2024-04-02 DIAGNOSIS — M25552 Pain in left hip: Secondary | ICD-10-CM

## 2024-04-02 DIAGNOSIS — M47816 Spondylosis without myelopathy or radiculopathy, lumbar region: Secondary | ICD-10-CM | POA: Diagnosis not present

## 2024-04-02 DIAGNOSIS — M5432 Sciatica, left side: Secondary | ICD-10-CM

## 2024-04-02 DIAGNOSIS — M5136 Other intervertebral disc degeneration, lumbar region with discogenic back pain only: Secondary | ICD-10-CM | POA: Diagnosis not present

## 2024-04-02 DIAGNOSIS — M48061 Spinal stenosis, lumbar region without neurogenic claudication: Secondary | ICD-10-CM | POA: Diagnosis not present

## 2024-04-02 NOTE — Progress Notes (Signed)
   I, Leotis Batter, CMA acting as a scribe for Artist Lloyd, MD.  Troy Little is a 73 y.o. male who presents to [facility] at [facility] today for L hip pain. Pt was previously seen in 2021 for his R knee.  Today, pt c/o L hip pain x > 3 moths, progressively worsening. Pt locates pain to lateral aspect radiating into the leg and shoot. Occasional shooting pain in the back of the leg. Also c/o flank pain but notes having kidney trouble/stone. Denies weakness in the L LE. Denies night disturbance. Denies n/t in the leg or foot. Notes pain into the gluteal region and groin.   Radiates: L LE Aggravates: sitting or laying for prolonged period, occasionally when walking Treatments tried: Tylenol , topical analgesic  Pertinent review of systems: No fevers or chills  Relevant historical information: Hypertension and diabetes   Exam:  BP 122/74   Pulse 62   Ht 5' 8 (1.727 m)   Wt 154 lb (69.9 kg)   SpO2 98%   BMI 23.42 kg/m  General: Well Developed, well nourished, and in no acute distress.   MSK: L-spine: Normal appearing Nontender palpation spinal midline. Decreased lumbar motion. Lower extremity strength is intact. Reflexes are intact Mildly positive slump test.  Left hip normal-appearing normal motion intact strength    Lab and Radiology Results  X-ray images lumbar spine obtained today personally and independently interpreted. DDD L5-S1.  No acute fractures are visible. Await formal radiology review    Assessment and Plan: 73 y.o. male with chronic left lateral hip and buttock pain radiating down the left leg in an L5 dermatomal pattern.  Symptoms are consistent with L5 lumbar radiculopathy and/or piriformis syndrome.  He is already had a trial of prednisone  which caused significant hyperglycemia and cannot tolerate gabapentin.  Will refer to physical therapy.  Recheck back in about 8 weeks.  If on return not better consider MRI lumbar spine or if  worse in the interim consider lumbar spine MRI.   PDMP not reviewed this encounter. Orders Placed This Encounter  Procedures   DG Lumbar Spine 2-3 Views    Standing Status:   Future    Number of Occurrences:   1    Expiration Date:   05/03/2024    Reason for Exam (SYMPTOM  OR DIAGNOSIS REQUIRED):   left-side LBP    Preferred imaging location?:   [facility]   Ambulatory referral to Physical Therapy    Referral Priority:   Routine    Referral Type:   Physical Medicine    Referral Reason:   Specialty Services Required    Requested Specialty:   Physical Therapy    Number of Visits Requested:   1   No orders of the defined types were placed in this encounter.    Discussed warning signs or symptoms. Please see discharge instructions. Patient expresses understanding.   The above documentation has been reviewed and is accurate and complete Artist Lloyd, M.D.

## 2024-04-02 NOTE — Patient Instructions (Addendum)
 Thank you for coming in today.   Please get an Xray today before you leave   A referral for physical therapy has been submitted. A representative from the physical therapy office will contact you to coordinate scheduling after confirming your benefits with your insurance provider. If you do not hear from the physical therapy office within the next 1-2 weeks, please let us  know.    See you back in 8 weeks.

## 2024-04-03 ENCOUNTER — Ambulatory Visit: Payer: Self-pay | Admitting: Family Medicine

## 2024-04-03 NOTE — Progress Notes (Signed)
Lumbar spine x-ray shows some arthritis.

## 2024-04-09 ENCOUNTER — Encounter: Payer: Self-pay | Admitting: Emergency Medicine

## 2024-04-09 ENCOUNTER — Ambulatory Visit: Payer: Self-pay | Admitting: Emergency Medicine

## 2024-04-09 ENCOUNTER — Ambulatory Visit (INDEPENDENT_AMBULATORY_CARE_PROVIDER_SITE_OTHER): Admitting: Emergency Medicine

## 2024-04-09 VITALS — BP 124/72 | HR 56 | Temp 97.9°F | Ht 68.0 in | Wt 155.4 lb

## 2024-04-09 DIAGNOSIS — Z87442 Personal history of urinary calculi: Secondary | ICD-10-CM | POA: Insufficient documentation

## 2024-04-09 DIAGNOSIS — R1032 Left lower quadrant pain: Secondary | ICD-10-CM | POA: Insufficient documentation

## 2024-04-09 DIAGNOSIS — R399 Unspecified symptoms and signs involving the genitourinary system: Secondary | ICD-10-CM | POA: Diagnosis not present

## 2024-04-09 LAB — CBC WITH DIFFERENTIAL/PLATELET
Basophils Absolute: 0 K/uL (ref 0.0–0.1)
Basophils Relative: 0.4 % (ref 0.0–3.0)
Eosinophils Absolute: 0.3 K/uL (ref 0.0–0.7)
Eosinophils Relative: 4.7 % (ref 0.0–5.0)
HCT: 42.7 % (ref 39.0–52.0)
Hemoglobin: 14.1 g/dL (ref 13.0–17.0)
Lymphocytes Relative: 33.8 % (ref 12.0–46.0)
Lymphs Abs: 2 K/uL (ref 0.7–4.0)
MCHC: 32.9 g/dL (ref 30.0–36.0)
MCV: 80.2 fl (ref 78.0–100.0)
Monocytes Absolute: 0.4 K/uL (ref 0.1–1.0)
Monocytes Relative: 7.3 % (ref 3.0–12.0)
Neutro Abs: 3.2 K/uL (ref 1.4–7.7)
Neutrophils Relative %: 53.8 % (ref 43.0–77.0)
Platelets: 168 K/uL (ref 150.0–400.0)
RBC: 5.32 Mil/uL (ref 4.22–5.81)
RDW: 14 % (ref 11.5–15.5)
WBC: 6 K/uL (ref 4.0–10.5)

## 2024-04-09 LAB — URINALYSIS, ROUTINE W REFLEX MICROSCOPIC
Bilirubin Urine: NEGATIVE
Ketones, ur: NEGATIVE
Leukocytes,Ua: NEGATIVE
Nitrite: NEGATIVE
Specific Gravity, Urine: <=1.005 — AB (ref 1.000–1.030)
Total Protein, Urine: NEGATIVE
Urine Glucose: NEGATIVE
Urobilinogen, UA: 0.2 (ref 0.0–1.0)
pH: 6 (ref 5.0–8.0)

## 2024-04-09 LAB — COMPREHENSIVE METABOLIC PANEL WITH GFR
ALT: 7 U/L (ref 0–53)
AST: 10 U/L (ref 0–37)
Albumin: 4.6 g/dL (ref 3.5–5.2)
Alkaline Phosphatase: 55 U/L (ref 39–117)
BUN: 9 mg/dL (ref 6–23)
CO2: 30 meq/L (ref 19–32)
Calcium: 9.8 mg/dL (ref 8.4–10.5)
Chloride: 101 meq/L (ref 96–112)
Creatinine, Ser: 0.98 mg/dL (ref 0.40–1.50)
GFR: 78.07 mL/min (ref >60.00)
Glucose, Bld: 164 mg/dL — ABNORMAL HIGH (ref 70–99)
Potassium: 4.2 meq/L (ref 3.5–5.1)
Sodium: 139 meq/L (ref 135–145)
Total Bilirubin: 0.5 mg/dL (ref 0.2–1.2)
Total Protein: 6.8 g/dL (ref 6.0–8.3)

## 2024-04-09 LAB — PSA: PSA: 0.36 ng/mL (ref 0.10–4.00)

## 2024-04-09 MED ORDER — TAMSULOSIN HCL 0.4 MG PO CAPS
0.4000 mg | ORAL_CAPSULE | Freq: Every day | ORAL | 3 refills | Status: DC
Start: 2024-04-09 — End: 2024-08-06

## 2024-04-09 NOTE — Patient Instructions (Signed)
 Abdominal Pain, Adult    Many things can cause belly (abdominal) pain. In most cases, belly pain is not a serious problem and can be watched and treated at home. But in some cases, it can be serious.  Your doctor will try to find the cause of your belly pain.  Follow these instructions at home:  Medicines  Take over-the-counter and prescription medicines only as told by your doctor.  Do not take medicines that help you poop (laxatives) unless told by your doctor.  General instructions  Watch your belly pain for any changes. Tell your doctor if the pain gets worse.  Drink enough fluid to keep your pee (urine) pale yellow.  Contact a doctor if:  Your belly pain changes or gets worse.  You have very bad cramping or bloating in your belly.  You vomit.  Your pain gets worse with meals, after eating, or with certain foods.  You have trouble pooping or have watery poop for more than 2-3 days.  You are not hungry, or you lose weight without trying.  You have signs of not getting enough fluid or water (dehydration). These may include:  Dark pee, very Chastain pee, or no pee.  Cracked lips or dry mouth.  Feeling sleepy or weak.  You have pain when you pee or poop.  Your belly pain wakes you up at night.  You have blood in your pee.  You have a fever.  Get help right away if:  You cannot stop vomiting.  Your pain is only in one part of your belly, like on the right side.  You have bloody or black poop, or poop that looks like tar.  You have trouble breathing.  You have chest pain.  These symptoms may be an emergency. Get help right away. Call 911.  Do not wait to see if the symptoms will go away.  Do not drive yourself to the hospital.  This information is not intended to replace advice given to you by your health care provider. Make sure you discuss any questions you have with your health care provider.  Document Revised: 06/23/2022 Document Reviewed: 06/23/2022  Elsevier Patient Education  2024 [employer].

## 2024-04-09 NOTE — Progress Notes (Signed)
 Troy Little 73 y.o.   Chief Complaint  Patient presents with   Abdominal Pain    Abdominal pain in the lower region and also having some pain on the left side. When urinating, there is a stop and start issue going on, no burning sensation. Been going for more than a month andprogressively getting worse. Was told by the [place] he has kidney stone.    HISTORY OF PRESENT ILLNESS: This is a 73 y.o. male with history of kidney stones complaining of a 2-week history of left-sided lower abdominal pain with radiation to the groin and left flank areas Denies gross hematuria.  Has decreased urinary flow with occasional urgency and dribbling Able to eat and drink.  Denies nausea or vomiting.  Denies fever or chills.  Abdominal Pain Associated symptoms include frequency. Pertinent negatives include no fever or headaches.     Prior to Admission medications   Medication Sig Start Date End Date Taking? Authorizing Provider  Alcohol  Swabs (ALCOHOL  PREP PAD) 70 % PADS USE PAD TO AFFECTED AREA AS DIRECTED 06/16/22  Yes [provider]  Alcohol  Swabs (DROPSAFE ALCOHOL  PREP) 70 % PADS APPLY TO AFFECTED AREA AS DIRECTED 05/12/23  Yes Libni Fusaro, Emil Schanz, MD  amLODipine  (NORVASC ) 5 MG tablet Take 2.5 tablets by mouth daily. 07/08/22  Yes [provider]  aspirin  EC 81 MG tablet Take 81 mg by mouth daily.   Yes [provider]  azelastine  (OPTIVAR ) 0.05 % ophthalmic solution Place 1 drop into both eyes 2 (two) times daily. 09/07/22  Yes Emett Stapel, Emil Schanz, MD  blood glucose meter kit and supplies Dispense based on patient and insurance preference. Use to test in the morning, noon, and bedtime as directed. (FOR ICD-10 E10.9, E11.9). 10/30/20  Yes Jason Leita Repine, FNP  Carboxymethylcellulose Sod PF (REFRESH PLUS) 0.5 % SOLN Place 1 drop into both eyes in the morning, at noon, and at bedtime.   Yes [provider]  cetirizine  (ZYRTEC ) 10 MG tablet Take 1 tablet (10 mg  total) by mouth daily. 03/01/24  Yes Paquita Printy, Emil Schanz, MD  cyclobenzaprine  (FLEXERIL ) 10 MG tablet Take 1 tablet (10 mg total) by mouth 3 (three) times daily as needed for muscle spasms. 12/29/22  Yes Pool, Victory, MD  diphenhydramine-acetaminophen  (TYLENOL  PM) 25-500 MG TABS tablet Take 1 tablet by mouth 2 (two) times daily as needed (pain/sleep).   Yes [provider]  famotidine  (PEPCID ) 20 MG tablet TAKE 1 TABLET TWICE DAILY 07/22/21  Yes Jason Leita Repine, FNP  Glucose 15 GM/32ML GEL TAKE 1 TUBE BY MOUTH AS DIRECTED BY YOUR PROVIDER AS NEEDED LOW GLUCOSE 06/16/22  Yes [provider]  isosorbide  mononitrate (IMDUR ) 30 MG 24 hr tablet Take 15 mg by mouth See admin instructions. Take 15 mg daily, may take a second 15 mg dose at night as needed for high blood pressure 06/16/22  Yes [provider]  ketotifen  (ZADITOR ) 0.035 % ophthalmic solution Place 1 drop into both eyes in the morning and at bedtime.   Yes [provider]  lisinopril  (ZESTRIL ) 40 MG tablet Take 1 tablet (40 mg total) by mouth daily. Overdue for Annual appt must see provider for future refills Patient taking differently: Take 20 mg by mouth daily. Overdue for Annual appt must see provider for future refills 07/14/20  Yes Jason Leita Repine, FNP  metFORMIN  (GLUCOPHAGE ) 500 MG tablet Take 1 tablet (500 mg total) by mouth daily with breakfast. Per patient, he takes 1/4 tablet per day when  he needs it Patient taking differently: Take 250-500 mg by mouth daily with breakfast. 11/10/18  Yes Jason Leita Repine, FNP  naproxen  (NAPROSYN ) 500 MG tablet Take 1 tablet (500 mg total) by mouth 2 (two) times daily. 04/29/23  Yes Charlyn Sora, MD  Omega-3 Fatty Acids (FISH OIL ) 1000 MG CAPS Take 2,000 mg by mouth 2 (two) times daily.   Yes [provider]  ondansetron  (ZOFRAN -ODT) 8 MG disintegrating tablet Take 1 tablet (8 mg total) by mouth every 8 (eight) hours as needed for nausea.  04/29/23  Yes Charlyn Sora, MD  pantoprazole  (PROTONIX ) 40 MG tablet Take 40 mg by mouth 2 (two) times daily. 07/14/22  Yes [provider]  primidone  (MYSOLINE ) 50 MG tablet Take 1 tablet (50 mg total) by mouth 2 (two) times daily. Per patient, takes  1 1/2 tablet in the am and 1 tablet in the afternoon Patient taking differently: Take 75 mg by mouth 3 (three) times daily. 11/10/18  Yes Jason Leita Repine, FNP  propranolol  (INDERAL ) 10 MG tablet Patient takes 1 1/2 tablet in the am, 1 in the afternoon, 1 in the evening Patient taking differently: Take 15 mg by mouth 3 (three) times daily. 11/10/18  Yes Jason Leita Repine, FNP  simvastatin  (ZOCOR ) 20 MG tablet Take 20 mg by mouth at bedtime.    Yes [provider]  terbinafine (LAMISIL) 1 % cream Apply 1 Application topically 2 (two) times daily. 07/08/22  Yes [provider]  triamcinolone  cream (KENALOG ) 0.1 % Apply 1 application topically 2 (two) times daily. 04/25/20  Yes Rollene Almarie LABOR, MD  TRUE METRIX BLOOD GLUCOSE TEST test strip USE AS DIRECTED 07/09/23  Yes Chantilly Linskey, Emil Schanz, MD  TRUEplus Lancets 33G MISC USE PER INSTRUCTIONS. 06/23/23  Yes Purcell Emil Schanz, MD    Allergies  Allergen Reactions   Prozac [Fluoxetine Hcl] Other (See Comments)    agitation   Dye Fdc Red [Red Dye #40 (Allura Red)] Rash    Patient Active Problem List   Diagnosis Date Noted   Cervical spondylosis with myelopathy and radiculopathy 12/28/2022   Depression, unspecified 08/10/2022   Thyroid  lesion 08/10/2022   Recurrent headache 07/22/2022   Left sided sciatica 03/15/2022   Current smoker 03/15/2022   Angina pectoris (HCC)    Hypertension associated with diabetes (HCC) 01/22/2019   Diastolic dysfunction 11/03/2015   Chronic neck pain 11/01/2015   TIA (transient ischemic attack) 10/31/2015   Dyslipidemia associated with type 2 diabetes mellitus (HCC) 10/31/2015   Anxiety and depression 10/31/2015    Tobacco use 10/31/2015   Essential tremor 10/31/2015    Past Medical History:  Diagnosis Date   Agoraphobia    Anxiety    Arthritis    Coronary artery disease    1 stent   Diabetes mellitus without complication (HCC)    Essential Tremor    GERD (gastroesophageal reflux disease)    Headache    Hepatitis 2012   pt states he had Hep C but was treated   History of hiatal hernia    Hypercholesteremia    Hypertension    MDD (major depressive disorder)    Psychosis (HCC)    Stroke (HCC)    series of little mini strokes per pt    Past Surgical History:  Procedure Laterality Date   ANTERIOR CERVICAL DECOMPRESSION/DISCECTOMY FUSION 4 LEVELS N/A 12/28/2022   Procedure: CERVICAL THREE-FOUR, CERVICAL FOUR-FIVE, CERVICAL FIVE-SIX, CERVICAL SIX-SEVEN ANTERIOR CERVICAL DECOMPRESSION/DISCECTOMY FUSION;  Surgeon: Louis Shove, MD;  Location: [facility] OR;  Service: Neurosurgery;  Laterality: N/A;   CARDIAC CATHETERIZATION     CHOLECYSTECTOMY     CORONARY STENT INTERVENTION N/A 09/24/2019   Procedure: CORONARY STENT INTERVENTION;  Surgeon: Dann Candyce RAMAN, MD;  Location: [facility] INVASIVE CV LAB;  Service: Cardiovascular;  Laterality: N/A;   LEFT HEART CATH N/A 09/24/2019   Procedure: Left Heart Cath;  Surgeon: Dann Candyce RAMAN, MD;  Location: [facility] INVASIVE CV LAB;  Service: Cardiovascular;  Laterality: N/A;    Social History   Socioeconomic History   Marital status: Married    Spouse name: Jon   Number of children: 1   Years of education: 12   Highest education level: Some college, no degree  Occupational History   Occupation: Disabled  Tobacco Use   Smoking status: Every Day    Current packs/day: 0.50    Average packs/day: 0.5 packs/day for 52.6 years (26.3 ttl pk-yrs)    Types: Cigarettes    Start date: 1973    Passive exposure: Current   Smokeless tobacco: Never   Tobacco comments:    Patient states he is not ready to quit smoking as of 12/01/2023.  Vaping Use   Vaping status:  Never Used  Substance and Sexual Activity   Alcohol  use: Not Currently   Drug use: Not Currently   Sexual activity: Yes  Other Topics Concern   Not on file  Social History Narrative   Married; Retired from [employer]   Current Smoker - Not ready to [employer] Drivers of [employer]: Low Risk  (12/01/2023)   Overall Financial Resource Strain (CARDIA)    Difficulty of Paying Living Expenses: Not hard at all  Food Insecurity: No Food Insecurity (12/01/2023)   Hunger Vital Sign    Worried About Running Out of Food in the Last Year: Never true    Ran Out of Food in the Last Year: Never true  Transportation Needs: No Transportation Needs (12/01/2023)   PRAPARE - Administrator, Civil Service (Medical): No    Lack of Transportation (Non-Medical): No  Physical Activity: Insufficiently Active (12/01/2023)   Exercise Vital Sign    Days of Exercise per Week: 3 days    Minutes of Exercise per Session: 30 min  Stress: No Stress Concern Present (12/01/2023)   [employer] of Occupational Health - Occupational Stress Questionnaire    Feeling of Stress : Not at all  Social Connections: Moderately Isolated (12/01/2023)   Social Connection and Isolation Panel    Frequency of Communication with Friends and Family: More than three times a week    Frequency of Social Gatherings with Friends and Family: More than three times a week    Attends Religious Services: Never    Database administrator or Organizations: No    Attends Banker Meetings: Never    Marital Status: Married  Catering manager Violence: Not At Risk (12/01/2023)   Humiliation, Afraid, Rape, and Kick questionnaire    Fear of Current or Ex-Partner: No    Emotionally Abused: No    Physically Abused: No    Sexually Abused: No    Family History  Problem Relation Age of Onset   Cancer Mother      Review of Systems  Constitutional: Negative.  Negative for chills and fever.  HENT:  Negative.  Negative for congestion and sore throat.   Respiratory: Negative.  Negative for cough and shortness of breath.   Cardiovascular: Negative.  Negative for chest  pain and palpitations.  Gastrointestinal:  Positive for abdominal pain.  Genitourinary:  Positive for flank pain, frequency and urgency.  Skin: Negative.  Negative for rash.  Neurological:  Negative for dizziness and headaches.  All other systems reviewed and are negative.   Vitals:   04/09/24 1025  BP: 124/72  Pulse: (!) 56  Temp: 97.9 F (36.6 C)  SpO2: 95%    Physical Exam Vitals reviewed.  Constitutional:      Appearance: He is well-developed.  HENT:     Head: Normocephalic.  Eyes:     Extraocular Movements: Extraocular movements intact.  Cardiovascular:     Rate and Rhythm: Normal rate and regular rhythm.     Heart sounds: Normal heart sounds.  Pulmonary:     Effort: Pulmonary effort is normal.     Breath sounds: Normal breath sounds.  Abdominal:     Palpations: Abdomen is soft.     Tenderness: There is no abdominal tenderness. There is no right CVA tenderness or left CVA tenderness.  Skin:    General: Skin is warm and dry.     Capillary Refill: Capillary refill takes less than 2 seconds.  Neurological:     General: No focal deficit present.     Mental Status: He is alert and oriented to person, place, and time.  Psychiatric:        Mood and Affect: Mood normal.        Behavior: Behavior normal.      ASSESSMENT & PLAN: A total of 42 minutes was spent with the patient and counseling/coordination of care regarding preparing for this visit, review of most recent office visit notes, review of multiple chronic medical conditions and their management, review of all medications, differential diagnosis of left lower abdominal pain and need for workup including renal CT scan, review of most recent bloodwork results, review of health maintenance items, education on nutrition, prognosis, documentation, and  need for follow up.   Problem List Items Addressed This Visit       Other   Left lower quadrant abdominal pain - Primary   Clinically stable.  No red flag signs or symptoms Benign abdominal examination.  Stable vital signs.  Afebrile. Has history of kidney stones Recommend renal CT scanning Recommend blood work and urinalysis Pain management discussed Recommend Tylenol  as needed for pain ED precautions given Advised to rest and stay well-hydrated Advised to contact the office if no better or worse during the next several days or weeks      Relevant Orders   CT RENAL STONE STUDY   Urinalysis   Comprehensive metabolic panel with GFR   CBC with Differential/Platelet   PSA   History of kidney stones   Differential diagnosis discussed.  Possible kidney stones. Pain management discussed Recommend to start Flomax  0.4 mg daily Recommend renal CT and possible urology referral depending on results      Relevant Orders   CT RENAL STONE STUDY   Urinalysis   Comprehensive metabolic panel with GFR   CBC with Differential/Platelet   PSA   Lower urinary tract symptoms   Patient has history of enlarged prostate Now having lower urinary tract symptoms Recommend PSA and urinalysis today Recommend to start Flomax  0.4 mg at bedtime May need referral to urology [facility]      Relevant Medications   tamsulosin  (FLOMAX ) 0.4 MG CAPS capsule   Other Relevant Orders   CT RENAL STONE STUDY   Urinalysis  Comprehensive metabolic panel with GFR   CBC with Differential/Platelet   PSA   Patient Instructions  Abdominal Pain, Adult  Many things can cause belly (abdominal) pain. In most cases, belly pain is not a serious problem and can be watched and treated at home. But in some cases, it can be serious. Your doctor will try to find the cause of your belly pain. Follow these instructions at home: Medicines Take over-the-counter and prescription medicines only as  told by your doctor. Do not take medicines that help you poop (laxatives) unless told by your doctor. General instructions Watch your belly pain for any changes. Tell your doctor if the pain gets worse. Drink enough fluid to keep your pee (urine) pale yellow. Contact a doctor if: Your belly pain changes or gets worse. You have very bad cramping or bloating in your belly. You vomit. Your pain gets worse with meals, after eating, or with certain foods. You have trouble pooping or have watery poop for more than 2-3 days. You are not hungry, or you lose weight without trying. You have signs of not getting enough fluid or water (dehydration). These may include: Dark pee, very little pee, or no pee. Cracked lips or dry mouth. Feeling sleepy or weak. You have pain when you pee or poop. Your belly pain wakes you up at night. You have blood in your pee. You have a fever. Get help right away if: You cannot stop vomiting. Your pain is only in one part of your belly, like on the right side. You have bloody or black poop, or poop that looks like tar. You have trouble breathing. You have chest pain. These symptoms may be an emergency. Get help right away. Call 911. Do not wait to see if the symptoms will go away. Do not drive yourself to the hospital. This information is not intended to replace advice given to you by your health care provider. Make sure you discuss any questions you have with your health care provider. Document Revised: 06/23/2022 Document Reviewed: 06/23/2022 Elsevier Patient Education  2024 Elsevier Inc.   Emil Schaumann, MD [facility]

## 2024-04-09 NOTE — Assessment & Plan Note (Signed)
 Patient has history of enlarged prostate Now having lower urinary tract symptoms Recommend PSA and urinalysis today Recommend to start Flomax  0.4 mg at bedtime May need referral to urology [facility]

## 2024-04-09 NOTE — Assessment & Plan Note (Signed)
 Differential diagnosis discussed.  Possible kidney stones. Pain management discussed Recommend to start Flomax  0.4 mg daily Recommend renal CT and possible urology referral depending on results

## 2024-04-09 NOTE — Assessment & Plan Note (Signed)
 Clinically stable.  No red flag signs or symptoms Benign abdominal examination.  Stable vital signs.  Afebrile. Has history of kidney stones Recommend renal CT scanning Recommend blood work and urinalysis Pain management discussed Recommend Tylenol  as needed for pain ED precautions given Advised to rest and stay well-hydrated Advised to contact the office if no better or worse during the next several days or weeks

## 2024-04-10 NOTE — Therapy (Signed)
 OUTPATIENT PHYSICAL THERAPY THORACOLUMBAR EVALUATION   Patient Name: Troy Little MRN: [id number] DOB:1951/03/29, 73 y.o., male Today's Date: 04/11/2024  END OF SESSION:  PT End of Session - 04/11/24 1412     Visit Number 1    Number of Visits 17    Date for PT Re-Evaluation 06/06/24    Authorization Type [employer]    Authorization Time Period auth tbd    Progress Note Due on Visit 10    PT Start Time 1413   late check in   PT Stop Time 1500    PT Time Calculation (min) 47 min    Activity Tolerance Patient tolerated treatment well          Past Medical History:  Diagnosis Date   Agoraphobia    Anxiety    Arthritis    Coronary artery disease    1 stent   Diabetes mellitus without complication (HCC)    Essential Tremor    GERD (gastroesophageal reflux disease)    Headache    Hepatitis 2012   pt states he had Hep C but was treated   History of hiatal hernia    Hypercholesteremia    Hypertension    MDD (major depressive disorder)    Psychosis (HCC)    Stroke (HCC)    series of little mini strokes per pt   Past Surgical History:  Procedure Laterality Date   ANTERIOR CERVICAL DECOMPRESSION/DISCECTOMY FUSION 4 LEVELS N/A 12/28/2022   Procedure: CERVICAL THREE-FOUR, CERVICAL FOUR-FIVE, CERVICAL FIVE-SIX, CERVICAL SIX-SEVEN ANTERIOR CERVICAL DECOMPRESSION/DISCECTOMY FUSION;  Surgeon: Louis Shove, MD;  Location: [facility] OR;  Service: Neurosurgery;  Laterality: N/A;   CARDIAC CATHETERIZATION     CHOLECYSTECTOMY     CORONARY STENT INTERVENTION N/A 09/24/2019   Procedure: CORONARY STENT INTERVENTION;  Surgeon: Dann Candyce RAMAN, MD;  Location: [facility] INVASIVE CV LAB;  Service: Cardiovascular;  Laterality: N/A;   LEFT HEART CATH N/A 09/24/2019   Procedure: Left Heart Cath;  Surgeon: Dann Candyce RAMAN, MD;  Location: [facility] INVASIVE CV LAB;  Service: Cardiovascular;  Laterality: N/A;   Patient Active Problem List   Diagnosis Date Noted   Left lower quadrant abdominal pain  04/09/2024   History of kidney stones 04/09/2024   Lower urinary tract symptoms 04/09/2024   Cervical spondylosis with myelopathy and radiculopathy 12/28/2022   Depression, unspecified 08/10/2022   Thyroid  lesion 08/10/2022   Recurrent headache 07/22/2022   Left sided sciatica 03/15/2022   Current smoker 03/15/2022   Angina pectoris (HCC)    Hypertension associated with diabetes (HCC) 01/22/2019   Diastolic dysfunction 11/03/2015   Chronic neck pain 11/01/2015   TIA (transient ischemic attack) 10/31/2015   Dyslipidemia associated with type 2 diabetes mellitus (HCC) 10/31/2015   Anxiety and depression 10/31/2015   Tobacco use 10/31/2015   Essential tremor 10/31/2015    PCP: Purcell Emil Schanz, MD  REFERRING PROVIDER: Joane Artist RAMAN, MD  REFERRING DIAG: [phone] (ICD-10-CM) - Left hip pain M54.16 (ICD-10-CM) - Lumbar radiculitis  Rationale for Evaluation and Treatment: Rehabilitation  THERAPY DIAG:  Other low back pain  Pain in left hip  ONSET DATE: several months  SUBJECTIVE:  SUBJECTIVE STATEMENT: Denies any MOI or change in activity but does report reduced activity levels over past few years. Feels symptoms are slowly worsening with time. Stiffness in the morning that tends to improve with activity. Tends to have his LE pain in mornings when he first gets up but also improves w/ activity.    PERTINENT HISTORY:  CAD, DM, tremor, GERD, headaches, HTN Reports prior cervical fusion  PAIN:  Are you having pain: no pain at present Location/description: L sided pain, starting at back down to foot Best-worst over past week: 0-8/10  - aggravating factors: sleeping, STS - Easing factors: walking, pain ointment, medication  PRECAUTIONS: None  RED FLAGS: Known history of urinary frequency  he states he is in communication with provider about, recently started medication for treatment. Hx of enlarged prostate and kidney stones  No saddle anesthesia or gross weakness Chronic numbness/tingling described in glove/stocking distribution, pt attributes to diabetes/neuropathy   WEIGHT BEARING RESTRICTIONS: No  FALLS:  Has patient fallen in last 6 months? No  LIVING ENVIRONMENT: Lives w/ wife, one story home. Difficulty w/ stairs he attributes to prior knee issues.   OCCUPATION: disabled vet  PLOF: Independent  PATIENT GOALS: feel better, get on an exercise routine  NEXT MD VISIT: TBD  OBJECTIVE:  Note: Objective measures were completed at Evaluation unless otherwise noted.  DIAGNOSTIC FINDINGS:  04/02/24 Lumbar XR: IMPRESSION: Degenerative changes. No acute osseous abnormalities.  PATIENT SURVEYS:  ODI: 15/50; 30%   COGNITION: Overall cognitive status: Within functional limits for tasks assessed     SENSATION/NEURO: Light touch intact BIL LE No clonus either LE Negative hoffmann and tromner sign BIL No ataxia with gait   LUMBAR ROM:   AROM eval  Flexion Proximal/shin s  Extension 75% s  Right lateral flexion knee  Left lateral flexion knee  Right rotation 75% s  Left rotation 75% s   (Blank rows = not tested) (Key: WFL = within functional limits not formally assessed, * = concordant pain, s = stiffness/stretching sensation, NT = not tested) Comment:   LOWER EXTREMITY ROM:      Right eval Left eval  Hip flexion    Hip extension    Hip internal rotation    Hip external rotation    Knee extension    Knee flexion    (Blank rows = not tested) (Key: WFL = within functional limits not formally assessed, * = concordant pain, s = stiffness/stretching sensation, NT = not tested)  Comments:    LOWER EXTREMITY MMT:    MMT Right eval Left eval  Hip flexion 4 4  Hip abduction (modified sitting) 5 5  Hip internal rotation 5 4+  Hip external  rotation 5 4+  Knee flexion 4+ 4+  Knee extension 4+ 4+  Ankle dorsiflexion 5 5   (Blank rows = not tested) (Key: WFL = within functional limits not formally assessed, * = concordant pain, s = stiffness/stretching sensation, NT = not tested)  Comments:    LUMBAR SPECIAL TESTS:   Slump test:   R: - (hamstring tightness)   L: - (hamstringtightness)  FUNCTIONAL TESTS:  5xSTS: 19.93sec UE support from thighs , mild L sided QL/glute pain   GAIT: Distance walked: within clinic Assistive device utilized: None Level of assistance: Complete Independence Comments: reduced stance time on L, inc trunk lean, reduced truncal rotation and arm swing   TREATMENT DATE:  [facility] Adult PT Treatment:  DATE: 04/11/24 Therapeutic Exercise: STS and hip ext practice reps, HEP handout + education, relevant anatomy/physiology and rationale for interventions, appropriate monitoring/modification                                                                                                                              PATIENT EDUCATION:  Education details: Pt education on PT impairments, prognosis, and POC. Informed consent. Rationale for interventions, safe/appropriate HEP performance Person educated: Patient Education method: Explanation, Demonstration, Tactile cues, Verbal cues Education comprehension: verbalized understanding, returned demonstration, verbal cues required, tactile cues required, and needs further education    HOME EXERCISE PROGRAM: Access Code: [email or web address] URL: [email or web address] Date: 04/11/2024 Prepared by: Alm Jenny  Exercises - Sit to Stand with Armchair  - 2-3 x daily - 1 sets - 5 reps - Standing Hip Extension with Counter Support  - 2-3 x daily - 1 sets - 8 reps  ASSESSMENT:  CLINICAL IMPRESSION: Patient is a 73 y.o. gentleman who was seen today for physical therapy evaluation and treatment for hip/back pain.  He denies any overt limitations but states he will have increased pain the mornings and with inactivity, expresses having to push through pain to perform usual tasks. Does note tendency to improve with light activity. Red flag questioning reassuring overall, see subjective, encouraged continued monitoring and communication w/ providers. On exam he demonstrates limitations in lumbar mobility and L hip strength, neuro screen unremarkable. 5xSTS is indicative of fall risk and does provoke mild concordant L hip/back pain but no distal symptoms. Tolerates session well overall and reports improved symptoms on departure. Recommend trial of skilled PT to address aforementioned deficits with aim of improving functional tolerance and reducing pain with typical activities. Pt departs today's session in no acute distress, all voiced concerns/questions addressed appropriately from PT perspective.    OBJECTIVE IMPAIRMENTS: decreased activity tolerance, decreased endurance, decreased mobility, decreased ROM, decreased strength, impaired perceived functional ability, and pain.   ACTIVITY LIMITATIONS: carrying, lifting, bending, squatting, stairs, transfers, and locomotion level  PARTICIPATION LIMITATIONS: meal prep, cleaning, laundry, and community activity  PERSONAL FACTORS: Age, Time since onset of injury/illness/exacerbation, and 3+ comorbidities: CAD, DM, tremor, GERD, headaches, HTN are also affecting patient's functional outcome.   REHAB POTENTIAL: Good  CLINICAL DECISION MAKING: Evolving/moderate complexity  EVALUATION COMPLEXITY: Moderate   GOALS:  SHORT TERM GOALS: Target date: 05/09/2024  Pt will demonstrate appropriate understanding and performance of initially prescribed HEP in order to facilitate improved independence with management of symptoms.  Baseline: HEP established  Goal status: INITIAL   2. Pt will report at least 25% improvement in overall pain levels over past week in order to  facilitate improved tolerance to typical daily activities.   Baseline: 0-8/10  Goal status: INITIAL    LONG TERM GOALS: Target date: 06/06/2024   Pt will improve at least 20% on ODI in order to demonstrate improved perception of functional status due to symptoms.  Baseline: 30% Goal status: INITIAL  2.  Pt will demonstrate lumbar flexion AROM to at least distal shin in order to demonstrate improved tolerance to functional movement patterns.  Baseline: see ROM chart above Goal status: INITIAL  3.  Pt will demonstrate symmetrical hip ER/IR MMT in order to demonstrate improved strength for functional movements.  Baseline: see MMT chart above Goal status: INITIAL  4. Pt will perform 5xSTS in </=14 sec in order to demonstrate reduced fall risk and improved functional independence. (MCID of 2.3sec)  Baseline: 19.93sec gentle UE support   Goal status: INITIAL   5. Pt will report at least 50% decrease in overall pain levels in past week in order to facilitate improved tolerance to basic ADLs/mobility.   Baseline: 0-8/10  Goal status: INITIAL    PLAN:  PT FREQUENCY: 2x/week  PT DURATION: 8 weeks  PLANNED INTERVENTIONS: 97164- PT Re-evaluation, 97750- Physical Performance Testing, 97110-Therapeutic exercises, 97530- Therapeutic activity, [id number]- Neuromuscular re-education, 97535- Self Care, [id number]- Manual therapy, [phone]- Gait training, [phone] (1-2 muscles), [id number] (3+ muscles)- Dry Needling, Patient/Family education, Balance training, Stair training, Taping, Joint mobilization, Spinal mobilization, Cryotherapy, and Moist heat.  PLAN FOR NEXT SESSION: Review/update HEP PRN. Work on [employer] exercises as appropriate with emphasis on lumbopelvic stability, core/hip strength and endurance. Symptom modification strategies as indicated/appropriate.    Alm DELENA Jenny PT, DPT 04/11/2024 5:20 PM    Referring diagnosis? M25.552 (ICD-10-CM) - Left hip pain M54.16 (ICD-10-CM) - Lumbar  radiculitis Treatment diagnosis? (if different than referring diagnosis) Other low back pain   Pain in left hip What was this (referring dx) caused by? []  Surgery []  Fall [x]  Ongoing issue []  Arthritis []  Other: ____________  Laterality: []  Rt [x]  Lt []  Both  Check all possible CPT codes:  *CHOOSE 10 OR LESS*    See Planned Interventions listed in the Plan section of the Evaluation.

## 2024-04-11 ENCOUNTER — Encounter: Payer: Self-pay | Admitting: Physical Therapy

## 2024-04-11 ENCOUNTER — Other Ambulatory Visit: Payer: Self-pay

## 2024-04-11 ENCOUNTER — Ambulatory Visit: Attending: Family Medicine | Admitting: Physical Therapy

## 2024-04-11 DIAGNOSIS — M5416 Radiculopathy, lumbar region: Secondary | ICD-10-CM | POA: Insufficient documentation

## 2024-04-11 DIAGNOSIS — M5459 Other low back pain: Secondary | ICD-10-CM | POA: Insufficient documentation

## 2024-04-11 DIAGNOSIS — M25552 Pain in left hip: Secondary | ICD-10-CM | POA: Insufficient documentation

## 2024-04-12 ENCOUNTER — Other Ambulatory Visit

## 2024-04-17 ENCOUNTER — Other Ambulatory Visit

## 2024-04-18 ENCOUNTER — Ambulatory Visit

## 2024-04-18 DIAGNOSIS — M25552 Pain in left hip: Secondary | ICD-10-CM | POA: Diagnosis not present

## 2024-04-18 DIAGNOSIS — M5459 Other low back pain: Secondary | ICD-10-CM

## 2024-04-18 DIAGNOSIS — M5416 Radiculopathy, lumbar region: Secondary | ICD-10-CM | POA: Diagnosis not present

## 2024-04-18 NOTE — Therapy (Signed)
 OUTPATIENT PHYSICAL THERAPY NOTE   Patient Name: Troy Little MRN: [id number] DOB:December 01, 1950, 73 y.o., male Today's Date: 04/18/2024  END OF SESSION:  PT End of Session - 04/18/24 1406     Visit Number 2    Number of Visits 17    Date for PT Re-Evaluation 06/06/24    Authorization Type [employer] medic[employer]    Authorization Time Period auth tbd    PT Start Time 1401    PT Stop Time 1441    PT Time Calculation (min) 40 min           Past Medical History:  Diagnosis Date   Agoraphobia    Anxiety    Arthritis    Coronary artery disease    1 stent   Diabetes mellitus without complication (HCC)    Essential Tremor    GERD (gastroesophageal reflux disease)    Headache    Hepatitis 2012   pt states he had Hep C but was treated   History of hiatal hernia    Hypercholesteremia    Hypertension    MDD (major depressive disorder)    Psychosis (HCC)    Stroke (HCC)    series of little mini strokes per pt   Past Surgical History:  Procedure Laterality Date   ANTERIOR CERVICAL DECOMPRESSION/DISCECTOMY FUSION 4 LEVELS N/A 12/28/2022   Procedure: CERVICAL THREE-FOUR, CERVICAL FOUR-FIVE, CERVICAL FIVE-SIX, CERVICAL SIX-SEVEN ANTERIOR CERVICAL DECOMPRESSION/DISCECTOMY FUSION;  Surgeon: Louis Shove, MD;  Location: [facility] OR;  Service: Neurosurgery;  Laterality: N/A;   CARDIAC CATHETERIZATION     CHOLECYSTECTOMY     CORONARY STENT INTERVENTION N/A 09/24/2019   Procedure: CORONARY STENT INTERVENTION;  Surgeon: Dann Candyce RAMAN, MD;  Location: [facility] INVASIVE CV LAB;  Service: Cardiovascular;  Laterality: N/A;   LEFT HEART CATH N/A 09/24/2019   Procedure: Left Heart Cath;  Surgeon: Dann Candyce RAMAN, MD;  Location: [facility] INVASIVE CV LAB;  Service: Cardiovascular;  Laterality: N/A;   Patient Active Problem List   Diagnosis Date Noted   Left lower quadrant abdominal pain 04/09/2024   History of kidney stones 04/09/2024   Lower urinary tract symptoms 04/09/2024   Cervical spondylosis with  myelopathy and radiculopathy 12/28/2022   Depression, unspecified 08/10/2022   Thyroid  lesion 08/10/2022   Recurrent headache 07/22/2022   Left sided sciatica 03/15/2022   Current smoker 03/15/2022   Angina pectoris (HCC)    Hypertension associated with diabetes (HCC) 01/22/2019   Diastolic dysfunction 11/03/2015   Chronic neck pain 11/01/2015   TIA (transient ischemic attack) 10/31/2015   Dyslipidemia associated with type 2 diabetes mellitus (HCC) 10/31/2015   Anxiety and depression 10/31/2015   Tobacco use 10/31/2015   Essential tremor 10/31/2015    PCP: Purcell Emil Schanz, MD  REFERRING PROVIDER: Joane Artist RAMAN, MD  REFERRING DIAG: [phone] (ICD-10-CM) - Left hip pain M54.16 (ICD-10-CM) - Lumbar radiculitis  Rationale for Evaluation and Treatment: Rehabilitation  THERAPY DIAG:  Other low back pain  Pain in left hip  ONSET DATE: several months  SUBJECTIVE:  SUBJECTIVE STATEMENT: 04/18/2024 Patient reports that he has some improvement with current HEP. He has 4/10 pain at start of session.   EVAL: Denies any MOI or change in activity but does report reduced activity levels over past few years. Feels symptoms are slowly worsening with time. Stiffness in the morning that tends to improve with activity. Tends to have his LE pain in mornings when he first gets up but also improves w/ activity.    PERTINENT HISTORY:  CAD, DM, tremor, GERD, headaches, HTN Reports prior cervical fusion  PAIN:  Are you having pain: no pain at present Location/description: L sided pain, starting at back down to foot Best-worst over past week: 0-8/10  - aggravating factors: sleeping, STS - Easing factors: walking, pain ointment, medication  PRECAUTIONS: None  RED FLAGS: Known history of urinary frequency  he states he is in communication with provider about, recently started medication for treatment. Hx of enlarged prostate and kidney stones  No saddle anesthesia or gross weakness Chronic numbness/tingling described in glove/stocking distribution, pt attributes to diabetes/neuropathy   WEIGHT BEARING RESTRICTIONS: No  FALLS:  Has patient fallen in last 6 months? No  LIVING ENVIRONMENT: Lives w/wife, one story home. Difficulty w/ stairs he attributes to prior knee issues.   OCCUPATION: disabled vet  PLOF: Independent  PATIENT GOALS: feel better, get on an exercise routine  NEXT MD VISIT: TBD  OBJECTIVE:  Note: Objective measures were completed at Evaluation unless otherwise noted.  DIAGNOSTIC FINDINGS:  04/02/24 Lumbar XR: IMPRESSION: Degenerative changes. No acute osseous abnormalities.  PATIENT SURVEYS:  ODI: 15/50; 30%   COGNITION: Overall cognitive status: Within functional limits for tasks assessed     SENSATION/NEURO: Light touch intact BIL LE No clonus either LE Negative hoffmann and tromner sign BIL No ataxia with gait   LUMBAR ROM:   AROM eval  Flexion Proximal/shin s  Extension 75% s  Right lateral flexion knee  Left lateral flexion knee  Right rotation 75% s  Left rotation 75% s   (Blank rows = not tested) (Key: WFL = within functional limits not formally assessed, * = concordant pain, s = stiffness/stretching sensation, NT = not tested) Comment:   LOWER EXTREMITY ROM:      Right eval Left eval  Hip flexion    Hip extension    Hip internal rotation    Hip external rotation    Knee extension    Knee flexion    (Blank rows = not tested) (Key: WFL = within functional limits not formally assessed, * = concordant pain, s = stiffness/stretching sensation, NT = not tested)  Comments:    LOWER EXTREMITY MMT:    MMT Right eval Left eval  Hip flexion 4 4  Hip abduction (modified sitting) 5 5  Hip internal rotation 5 4+  Hip external  rotation 5 4+  Knee flexion 4+ 4+  Knee extension 4+ 4+  Ankle dorsiflexion 5 5   (Blank rows = not tested) (Key: WFL = within functional limits not formally assessed, * = concordant pain, s = stiffness/stretching sensation, NT = not tested)  Comments:    LUMBAR SPECIAL TESTS:   Slump test:   R: - (hamstring tightness)   L: - (hamstringtightness)  FUNCTIONAL TESTS:  5xSTS: 19.93sec UE support from thighs , mild L sided QL/glute pain   GAIT: Distance walked: within clinic Assistive device utilized: None Level of assistance: Complete Independence Comments: reduced stance time on L, inc trunk lean, reduced truncal rotation and arm swing  TREATMENT DATE:    [facility] Adult PT Treatment:                                                DATE: 04/18/2024  Neuromuscular Reeducation:  STS 2 x 8  Supine glute bridges, 2 x 8  Supine SLR 2 x 8 each  Supine hip abduction 2 x 8 each  Seated dynadisc, 2 x 8 marches  Standing hip extension x 8 each Standing hip abduction x 8 each  Suitcase carry 10lb x 1 lap, 15lb x 2 laps  Resisted Isometric core walkouts blue TB x 10 each side    [facility] Adult PT Treatment:                                                DATE: 04/11/24 Therapeutic Exercise: STS and hip ext practice reps, HEP handout + education, relevant anatomy/physiology and rationale for interventions, appropriate monitoring/modification                                                                                                                              PATIENT EDUCATION:  Education details: Pt education on PT impairments, prognosis, and POC. Informed consent. Rationale for interventions, safe/appropriate HEP performance Person educated: Patient Education method: Explanation, Demonstration, Tactile cues, Verbal cues Education comprehension: verbalized understanding, returned demonstration, verbal cues required, tactile cues required, and needs further education    HOME EXERCISE  PROGRAM: Access Code: [email or web address] URL: [email or web address] Date: 04/11/2024 Prepared by: Alm Jenny  Exercises - Sit to Stand with Armchair  - 2-3 x daily - 1 sets - 5 reps - Standing Hip Extension with Counter Support  - 2-3 x daily - 1 sets - 8 reps  ASSESSMENT:  CLINICAL IMPRESSION: 04/18/2024 Troy Little had good tolerance of today's treatment session, which focused on building core mm stabilization and hip strengthening program. He demonstrated improved stability with second set of dynadisc marching. We will continue to progress per POC as tolerated, in order to reach established rehab goals.    EVAL: Patient is a 73 y.o. gentleman who was seen today for physical therapy evaluation and treatment for hip/back pain. He denies any overt limitations but states he will have increased pain the mornings and with inactivity, expresses having to push through pain to perform usual tasks. Does note tendency to improve with light activity. Red flag questioning reassuring overall, see subjective, encouraged continued monitoring and communication w/ providers. On exam he demonstrates limitations in lumbar mobility and L hip strength, neuro screen unremarkable. 5xSTS is indicative of fall risk and does provoke mild concordant L hip/back pain but no distal symptoms. Tolerates session well overall and reports improved symptoms on departure.  Recommend trial of skilled PT to address aforementioned deficits with aim of improving functional tolerance and reducing pain with typical activities. Pt departs today's session in no acute distress, all voiced concerns/questions addressed appropriately from PT perspective.    OBJECTIVE IMPAIRMENTS: decreased activity tolerance, decreased endurance, decreased mobility, decreased ROM, decreased strength, impaired perceived functional ability, and pain.   ACTIVITY LIMITATIONS: carrying, lifting, bending, squatting, stairs, transfers, and locomotion  level  PARTICIPATION LIMITATIONS: meal prep, cleaning, laundry, and community activity  PERSONAL FACTORS: Age, Time since onset of injury/illness/exacerbation, and 3+ comorbidities: CAD, DM, tremor, GERD, headaches, HTN are also affecting patient's functional outcome.   REHAB POTENTIAL: Good  CLINICAL DECISION MAKING: Evolving/moderate complexity  EVALUATION COMPLEXITY: Moderate   GOALS:  SHORT TERM GOALS: Target date: 05/09/2024  Pt will demonstrate appropriate understanding and performance of initially prescribed HEP in order to facilitate improved independence with management of symptoms.  Baseline: HEP established  Goal status: INITIAL   2. Pt will report at least 25% improvement in overall pain levels over past week in order to facilitate improved tolerance to typical daily activities.   Baseline: 0-8/10  Goal status: INITIAL    LONG TERM GOALS: Target date: 06/06/2024   Pt will improve at least 20% on ODI in order to demonstrate improved perception of functional status due to symptoms.  Baseline: 30% Goal status: INITIAL  2.  Pt will demonstrate lumbar flexion AROM to at least distal shin in order to demonstrate improved tolerance to functional movement patterns.  Baseline: see ROM chart above Goal status: INITIAL  3.  Pt will demonstrate symmetrical hip ER/IR MMT in order to demonstrate improved strength for functional movements.  Baseline: see MMT chart above Goal status: INITIAL  4. Pt will perform 5xSTS in </=14 sec in order to demonstrate reduced fall risk and improved functional independence. (MCID of 2.3sec)  Baseline: 19.93sec gentle UE support   Goal status: INITIAL   5. Pt will report at least 50% decrease in overall pain levels in past week in order to facilitate improved tolerance to basic ADLs/mobility.   Baseline: 0-8/10  Goal status: INITIAL    PLAN:  PT FREQUENCY: 2x/week  PT DURATION: 8 weeks  PLANNED INTERVENTIONS: 97164- PT Re-evaluation,  97750- Physical Performance Testing, 97110-Therapeutic exercises, 97530- Therapeutic activity, [id number]- Neuromuscular re-education, 97535- Self Care, [id number]- Manual therapy, [phone]- Gait training, [phone] (1-2 muscles), [phone] (3+ muscles)- Dry Needling, Patient/Family education, Balance training, Stair training, Taping, Joint mobilization, Spinal mobilization, Cryotherapy, and Moist heat.  PLAN FOR NEXT SESSION: Review/update HEP PRN. Work on [employer] exercises as appropriate with emphasis on lumbopelvic stability, core/hip strength and endurance. Symptom modification strategies as indicated/appropriate.    Marko Molt, PT, DPT  04/18/2024 3:15 PM    Referring diagnosis? M25.552 (ICD-10-CM) - Left hip pain M54.16 (ICD-10-CM) - Lumbar radiculitis Treatment diagnosis? (if different than referring diagnosis) Other low back pain   Pain in left hip What was this (referring dx) caused by? []  Surgery []  Fall [x]  Ongoing issue []  Arthritis []  Other: ____________  Laterality: []  Rt [x]  Lt []  Both  Check all possible CPT codes:  *CHOOSE 10 OR LESS*    See Planned Interventions listed in the Plan section of the Evaluation.

## 2024-04-23 ENCOUNTER — Ambulatory Visit
Admission: RE | Admit: 2024-04-23 | Discharge: 2024-04-23 | Disposition: A | Discharge status: Home / Self Care | Source: Ambulatory Visit | Attending: Emergency Medicine | Admitting: Emergency Medicine

## 2024-04-23 DIAGNOSIS — R399 Unspecified symptoms and signs involving the genitourinary system: Secondary | ICD-10-CM

## 2024-04-23 DIAGNOSIS — Z87442 Personal history of urinary calculi: Secondary | ICD-10-CM

## 2024-04-23 DIAGNOSIS — R1032 Left lower quadrant pain: Secondary | ICD-10-CM

## 2024-04-23 DIAGNOSIS — N200 Calculus of kidney: Secondary | ICD-10-CM | POA: Diagnosis not present

## 2024-04-24 NOTE — Therapy (Incomplete)
 OUTPATIENT PHYSICAL THERAPY NOTE   Patient Name: Troy Little MRN: [id number] DOB:07-28-1951, 73 y.o., male Today's Date: 04/24/2024  END OF SESSION:     Past Medical History:  Diagnosis Date   Agoraphobia    Anxiety    Arthritis    Coronary artery disease    1 stent   Diabetes mellitus without complication (HCC)    Essential Tremor    GERD (gastroesophageal reflux disease)    Headache    Hepatitis 2012   pt states he had Hep C but was treated   History of hiatal hernia    Hypercholesteremia    Hypertension    MDD (major depressive disorder)    Psychosis (HCC)    Stroke (HCC)    series of little mini strokes per pt   Past Surgical History:  Procedure Laterality Date   ANTERIOR CERVICAL DECOMPRESSION/DISCECTOMY FUSION 4 LEVELS N/A 12/28/2022   Procedure: CERVICAL THREE-FOUR, CERVICAL FOUR-FIVE, CERVICAL FIVE-SIX, CERVICAL SIX-SEVEN ANTERIOR CERVICAL DECOMPRESSION/DISCECTOMY FUSION;  Surgeon: Louis Shove, MD;  Location: [facility] OR;  Service: Neurosurgery;  Laterality: N/A;   CARDIAC CATHETERIZATION     CHOLECYSTECTOMY     CORONARY STENT INTERVENTION N/A 09/24/2019   Procedure: CORONARY STENT INTERVENTION;  Surgeon: Dann Candyce RAMAN, MD;  Location: [facility] INVASIVE CV LAB;  Service: Cardiovascular;  Laterality: N/A;   LEFT HEART CATH N/A 09/24/2019   Procedure: Left Heart Cath;  Surgeon: Dann Candyce RAMAN, MD;  Location: [facility] INVASIVE CV LAB;  Service: Cardiovascular;  Laterality: N/A;   Patient Active Problem List   Diagnosis Date Noted   Left lower quadrant abdominal pain 04/09/2024   History of kidney stones 04/09/2024   Lower urinary tract symptoms 04/09/2024   Cervical spondylosis with myelopathy and radiculopathy 12/28/2022   Depression, unspecified 08/10/2022   Thyroid  lesion 08/10/2022   Recurrent headache 07/22/2022   Left sided sciatica 03/15/2022   Current smoker 03/15/2022   Angina pectoris (HCC)    Hypertension associated with diabetes (HCC) 01/22/2019    Diastolic dysfunction 11/03/2015   Chronic neck pain 11/01/2015   TIA (transient ischemic attack) 10/31/2015   Dyslipidemia associated with type 2 diabetes mellitus (HCC) 10/31/2015   Anxiety and depression 10/31/2015   Tobacco use 10/31/2015   Essential tremor 10/31/2015    PCP: Purcell Emil Schanz, MD  REFERRING PROVIDER: Joane Artist RAMAN, MD  REFERRING DIAG: [id number] (ICD-10-CM) - Left hip pain M54.16 (ICD-10-CM) - Lumbar radiculitis  Rationale for Evaluation and Treatment: Rehabilitation  THERAPY DIAG:  No diagnosis found.  ONSET DATE: several months  SUBJECTIVE:  SUBJECTIVE STATEMENT: 04/24/2024: ***  *** Patient reports that he has some improvement with current HEP. He has 4/10 pain at start of session.   EVAL: Denies any MOI or change in activity but does report reduced activity levels over past few years. Feels symptoms are slowly worsening with time. Stiffness in the morning that tends to improve with activity. Tends to have his LE pain in mornings when he first gets up but also improves w/ activity.    PERTINENT HISTORY:  CAD, DM, tremor, GERD, headaches, HTN Reports prior cervical fusion  PAIN:  Are you having pain: no pain at present ***   Per eval:  Location/description: L sided pain, starting at back down to foot Best-worst over past week: 0-8/10  - aggravating factors: sleeping, STS - Easing factors: walking, pain ointment, medication  PRECAUTIONS: None  RED FLAGS: Known history of urinary frequency he states he is in communication with provider about, recently started medication for treatment. Hx of enlarged prostate and kidney stones  No saddle anesthesia or gross weakness Chronic numbness/tingling described in glove/stocking distribution, pt attributes to  diabetes/neuropathy   WEIGHT BEARING RESTRICTIONS: No  FALLS:  Has patient fallen in last 6 months? No  LIVING ENVIRONMENT: Lives w/wife, one story home. Difficulty w/ stairs he attributes to prior knee issues.   OCCUPATION: disabled vet  PLOF: Independent  PATIENT GOALS: feel better, get on an exercise routine  NEXT MD VISIT: TBD  OBJECTIVE:  Note: Objective measures were completed at Evaluation unless otherwise noted.  DIAGNOSTIC FINDINGS:  04/02/24 Lumbar XR: IMPRESSION: Degenerative changes. No acute osseous abnormalities.  PATIENT SURVEYS:  ODI: 15/50; 30%   COGNITION: Overall cognitive status: Within functional limits for tasks assessed     SENSATION/NEURO: Light touch intact BIL LE No clonus either LE Negative hoffmann and tromner sign BIL No ataxia with gait   LUMBAR ROM:   AROM eval  Flexion Proximal/shin s  Extension 75% s  Right lateral flexion knee  Left lateral flexion knee  Right rotation 75% s  Left rotation 75% s   (Blank rows = not tested) (Key: WFL = within functional limits not formally assessed, * = concordant pain, s = stiffness/stretching sensation, NT = not tested) Comment:   LOWER EXTREMITY ROM:      Right eval Left eval  Hip flexion    Hip extension    Hip internal rotation    Hip external rotation    Knee extension    Knee flexion    (Blank rows = not tested) (Key: WFL = within functional limits not formally assessed, * = concordant pain, s = stiffness/stretching sensation, NT = not tested)  Comments:    LOWER EXTREMITY MMT:    MMT Right eval Left eval  Hip flexion 4 4  Hip abduction (modified sitting) 5 5  Hip internal rotation 5 4+  Hip external rotation 5 4+  Knee flexion 4+ 4+  Knee extension 4+ 4+  Ankle dorsiflexion 5 5   (Blank rows = not tested) (Key: WFL = within functional limits not formally assessed, * = concordant pain, s = stiffness/stretching sensation, NT = not tested)  Comments:    LUMBAR  SPECIAL TESTS:   Slump test:   R: - (hamstring tightness)   L: - (hamstringtightness)  FUNCTIONAL TESTS:  5xSTS: 19.93sec UE support from thighs , mild L sided QL/glute pain   GAIT: Distance walked: within clinic Assistive device utilized: None Level of assistance: Complete Independence Comments: reduced stance time on L,  inc trunk lean, reduced truncal rotation and arm swing   TREATMENT DATE:    [facility] Adult PT Treatment:                                                DATE: 04/25/24 Therapeutic Exercise: *** Manual Therapy: *** Neuromuscular re-ed: *** Therapeutic Activity: *** Modalities: *** Self Care: ***    [facility] Adult PT Treatment:                                                DATE: 04/18/2024  Neuromuscular Reeducation:  STS 2 x 8  Supine glute bridges, 2 x 8  Supine SLR 2 x 8 each  Supine hip abduction 2 x 8 each  Seated dynadisc, 2 x 8 marches  Standing hip extension x 8 each Standing hip abduction x 8 each  Suitcase carry 10lb x 1 lap, 15lb x 2 laps  Resisted Isometric core walkouts blue TB x 10 each side    [facility] Adult PT Treatment:                                                DATE: 04/11/24 Therapeutic Exercise: STS and hip ext practice reps, HEP handout + education, relevant anatomy/physiology and rationale for interventions, appropriate monitoring/modification                                                                                                                              PATIENT EDUCATION:  Education details: rationale for interventions, HEP  Person educated: Patient Education method: Explanation, Demonstration, Tactile cues, Verbal cues Education comprehension: verbalized understanding, returned demonstration, verbal cues required, tactile cues required, and needs further education     HOME EXERCISE PROGRAM: Access Code: [email or web address] URL: [email or web address] Date: 04/11/2024 Prepared by: Alm Jenny  Exercises - Sit  to Stand with Armchair  - 2-3 x daily - 1 sets - 5 reps - Standing Hip Extension with Counter Support  - 2-3 x daily - 1 sets - 8 reps  ASSESSMENT:  CLINICAL IMPRESSION: 04/24/2024: ***  *** Isao had good tolerance of today's treatment session, which focused on building core mm stabilization and hip strengthening program. He demonstrated improved stability with second set of dynadisc marching. We will continue to progress per POC as tolerated, in order to reach established rehab goals.    EVAL: Patient is a 73 y.o. gentleman who was seen today for physical therapy evaluation and treatment for hip/back pain. He denies any overt limitations but states he will have increased pain the mornings and  with inactivity, expresses having to push through pain to perform usual tasks. Does note tendency to improve with light activity. Red flag questioning reassuring overall, see subjective, encouraged continued monitoring and communication w/ providers. On exam he demonstrates limitations in lumbar mobility and L hip strength, neuro screen unremarkable. 5xSTS is indicative of fall risk and does provoke mild concordant L hip/back pain but no distal symptoms. Tolerates session well overall and reports improved symptoms on departure. Recommend trial of skilled PT to address aforementioned deficits with aim of improving functional tolerance and reducing pain with typical activities. Pt departs today's session in no acute distress, all voiced concerns/questions addressed appropriately from PT perspective.    OBJECTIVE IMPAIRMENTS: decreased activity tolerance, decreased endurance, decreased mobility, decreased ROM, decreased strength, impaired perceived functional ability, and pain.   ACTIVITY LIMITATIONS: carrying, lifting, bending, squatting, stairs, transfers, and locomotion level  PARTICIPATION LIMITATIONS: meal prep, cleaning, laundry, and community activity  PERSONAL FACTORS: Age, Time since onset of  injury/illness/exacerbation, and 3+ comorbidities: CAD, DM, tremor, GERD, headaches, HTN are also affecting patient's functional outcome.   REHAB POTENTIAL: Good  CLINICAL DECISION MAKING: Evolving/moderate complexity  EVALUATION COMPLEXITY: Moderate   GOALS:  SHORT TERM GOALS: Target date: 05/09/2024  Pt will demonstrate appropriate understanding and performance of initially prescribed HEP in order to facilitate improved independence with management of symptoms.  Baseline: HEP established  Goal status: INITIAL   2. Pt will report at least 25% improvement in overall pain levels over past week in order to facilitate improved tolerance to typical daily activities.   Baseline: 0-8/10  Goal status: INITIAL    LONG TERM GOALS: Target date: 06/06/2024   Pt will improve at least 20% on ODI in order to demonstrate improved perception of functional status due to symptoms.  Baseline: 30% Goal status: INITIAL  2.  Pt will demonstrate lumbar flexion AROM to at least distal shin in order to demonstrate improved tolerance to functional movement patterns.  Baseline: see ROM chart above Goal status: INITIAL  3.  Pt will demonstrate symmetrical hip ER/IR MMT in order to demonstrate improved strength for functional movements.  Baseline: see MMT chart above Goal status: INITIAL  4. Pt will perform 5xSTS in </=14 sec in order to demonstrate reduced fall risk and improved functional independence. (MCID of 2.3sec)  Baseline: 19.93sec gentle UE support   Goal status: INITIAL   5. Pt will report at least 50% decrease in overall pain levels in past week in order to facilitate improved tolerance to basic ADLs/mobility.   Baseline: 0-8/10  Goal status: INITIAL    PLAN:  PT FREQUENCY: 2x/week  PT DURATION: 8 weeks  PLANNED INTERVENTIONS: 97164- PT Re-evaluation, 97750- Physical Performance Testing, 97110-Therapeutic exercises, 97530- Therapeutic activity, [id number]- Neuromuscular re-education, 97535-  Self Care, 02859- Manual therapy, [phone]- Gait training, [phone] (1-2 muscles), 20561 (3+ muscles)- Dry Needling, Patient/Family education, Balance training, Stair training, Taping, Joint mobilization, Spinal mobilization, Cryotherapy, and Moist heat.  PLAN FOR NEXT SESSION: Review/update HEP PRN. Work on [employer] exercises as appropriate with emphasis on lumbopelvic stability, core/hip strength and endurance. Symptom modification strategies as indicated/appropriate.    Alm DELENA Jenny PT, DPT 04/24/2024 12:49 PM     Referring diagnosis? M25.552 (ICD-10-CM) - Left hip pain M54.16 (ICD-10-CM) - Lumbar radiculitis Treatment diagnosis? (if different than referring diagnosis) Other low back pain   Pain in left hip What was this (referring dx) caused by? []  Surgery []  Fall [x]  Ongoing issue []  Arthritis []  Other: ____________  Laterality: []  Rt [  x] Lt []  Both  Check all possible CPT codes:  *CHOOSE 10 OR LESS*    See Planned Interventions listed in the Plan section of the Evaluation.

## 2024-04-25 ENCOUNTER — Telehealth: Payer: Self-pay | Admitting: Physical Therapy

## 2024-04-25 ENCOUNTER — Ambulatory Visit: Admitting: Physical Therapy

## 2024-04-25 NOTE — Telephone Encounter (Signed)
 Received office communication that pt cancelled for HTN - called pt to check in on him, he states he is in communication w/ provider and will seek care if persists/worsens. Confirmed date/time of next appt - he voices preference for 1x/week and requests cancellation of visits accordingly. States he would prefer to keep Tuesday appts and cancel this week's Friday appt. He denies any acute concerns or questions

## 2024-04-27 ENCOUNTER — Ambulatory Visit

## 2024-05-01 ENCOUNTER — Ambulatory Visit

## 2024-05-03 ENCOUNTER — Ambulatory Visit

## 2024-05-08 ENCOUNTER — Encounter

## 2024-05-10 ENCOUNTER — Encounter

## 2024-06-05 ENCOUNTER — Other Ambulatory Visit: Payer: Self-pay | Admitting: Emergency Medicine

## 2024-06-05 DIAGNOSIS — E1159 Type 2 diabetes mellitus with other circulatory complications: Secondary | ICD-10-CM

## 2024-06-05 DIAGNOSIS — E1165 Type 2 diabetes mellitus with hyperglycemia: Secondary | ICD-10-CM

## 2024-06-11 ENCOUNTER — Ambulatory Visit: Admitting: Family Medicine

## 2024-06-11 ENCOUNTER — Encounter: Payer: Self-pay | Admitting: Family Medicine

## 2024-06-11 VITALS — BP 150/70 | HR 49 | Ht 68.0 in | Wt 161.0 lb

## 2024-06-11 DIAGNOSIS — M5416 Radiculopathy, lumbar region: Secondary | ICD-10-CM

## 2024-06-11 DIAGNOSIS — M25552 Pain in left hip: Secondary | ICD-10-CM

## 2024-06-11 NOTE — Patient Instructions (Addendum)
Thank you for coming in today. Recheck as needed.    

## 2024-06-11 NOTE — Progress Notes (Signed)
   I, Leotis Batter, CMA acting as a scribe for Artist Lloyd, MD.  Troy Little is a 73 y.o. male who presents to [facility] at [facility] today for f/u lumbar radiculitis and L hip pain. Pt was last seen by Dr. Lloyd on 04/02/24 and was referred to PT, completing 2 visits (canceling all remaining visits).  Today, pt reports significant improvement of sx with PT and HEP. Sx have been well controlled. Continues to have some neck pain.   Dx imaging: 04/02/24 L-spine XR  Pertinent review of systems: No fevers or chills  Relevant historical information: Diabetes   Exam:  BP (!) 150/70   Pulse (!) 49   Ht 5' 8 (1.727 m)   Wt 161 lb (73 kg)   SpO2 98%   BMI 24.48 kg/m  General: Well Developed, well nourished, and in no acute distress.   MSK: L-spine decreased lumbar motion normal gait       Assessment and Plan: 73 y.o. male with significant improved back pain and radiculopathy with physical therapy.  Plan to continue home exercise program and check back as needed.   PDMP not reviewed this encounter. No orders of the defined types were placed in this encounter.  No orders of the defined types were placed in this encounter.    Discussed warning signs or symptoms. Please see discharge instructions. Patient expresses understanding.   The above documentation has been reviewed and is accurate and complete Artist Lloyd, M.D.

## 2024-06-13 ENCOUNTER — Other Ambulatory Visit: Payer: Self-pay | Admitting: Emergency Medicine

## 2024-07-25 ENCOUNTER — Other Ambulatory Visit: Payer: Self-pay | Admitting: Emergency Medicine

## 2024-07-25 DIAGNOSIS — E1165 Type 2 diabetes mellitus with hyperglycemia: Secondary | ICD-10-CM

## 2024-07-25 NOTE — Telephone Encounter (Signed)
 Copied from CRM [id number]. Topic: Clinical - Medication Refill >> Jul 25, 2024 11:48 AM Suzen RAMAN wrote: Medication: TRUE METRIX BLOOD GLUCOSE TEST test strip  Has the patient contacted their pharmacy? Yes   [facility] - [place], [place] - [address] [place] [address] Phone: [phone] Fax: [phone]  Is this the correct pharmacy for this prescription? Yes If no, delete pharmacy and type the correct one.   Has the prescription been filled recently? No  Is the patient out of the medication? Yes  Has the patient been seen for an appointment in the last year OR does the patient have an upcoming appointment? Yes  Can we respond through MyChart? Yes  Agent: Please be advised that Rx refills may take up to 3 business days. We ask that you follow-up with your pharmacy.

## 2024-07-30 MED ORDER — TRUE METRIX BLOOD GLUCOSE TEST VI STRP
ORAL_STRIP | 3 refills | Status: AC
Start: 2024-07-30 — End: ?

## 2024-07-31 NOTE — H&P (View-Only) (Signed)
 Cardiology Office Note:   Date:  07/31/2024  ID:  Troy Little, DOB 1951/02/01, MRN [id number] PCP: Troy Emil Schanz, MD  [facility] Providers Cardiologist:  Troy Archer, MD { Chief Complaint:  Chief Complaint  Patient presents with   Chest Pain       History of Present Illness:   Troy Little is a 73 y.o. male with a PMH of CAD s/p PCI to RCA (2021), HTN, HLD, DM 2, hepatitis C, prior polysubstance abuse (EtOH, cocaine), and tobacco use who presents as a new patient referral from the [place] for the management of CAD.  The patient was last seen by cardiology in 2021 after undergoing PCI to his RCA.  Today the patient reports that he has been having frequent chest pains that are concerning to him.  He describes having sharp central chest pains that occur sporadically.  He does not really exert himself so he is not sure if there is an exertional component.  He says that these episodes of chest pain can last for minutes to hours and are associated with diaphoresis, nausea, and SOB.  The symptoms have been going on for a long time; however, they have become much more frequent over the past 3 months.  He further endorses occasional palpitations, but denies syncope and presyncope.  The patient still smokes half a pack per day since 1973.  He denies alcohol  and illicit drug use but used to have issues with these.  He is taking all of his medications as prescribed without side effect.   Past Medical History:  Diagnosis Date   Agoraphobia    Anxiety    Arthritis    Coronary artery disease    1 stent   Diabetes mellitus without complication (HCC)    Essential Tremor    GERD (gastroesophageal reflux disease)    Headache    Hepatitis 2012   pt states he had Hep C but was treated   History of hiatal hernia    Hypercholesteremia    Hypertension    MDD (major depressive disorder)    Psychosis (HCC)    Stroke (HCC)    series of little mini strokes per pt      Studies Reviewed:    EKG:  EKG Interpretation Date/Time:  Wednesday August 01 2024 09:40:39 EST Ventricular Rate:  55 PR Interval:  140 QRS Duration:  88 QT Interval:  428 QTC Calculation: 409 R Axis:   88  Text Interpretation: Sinus bradycardia When compared with ECG of 15-Dec-2022 10:41, No significant change was found Confirmed by Little Troy [id number]) on 08/01/2024 10:01:59 AM     Cardiac Studies & Procedures   ______________________________________________________________________________________________ CARDIAC CATHETERIZATION  CARDIAC CATHETERIZATION 09/24/2019  Conclusion  The left ventricular systolic function is normal.  LV end diastolic pressure is low; left heart cath done due to low BP post sedation. Additional IV hydration was given for low BP  The left ventricular ejection fraction is 55-65% by visual estimate. LVEF checked since there was no recent LVEF on the chart and additional fluids were needed.  There is no aortic valve stenosis.  Prox RCA lesion is 90% stenosed.  A drug-eluting stent was successfully placed using a SYNERGY XD 2.50X16, postdilated to 2.75 mm.  Post intervention, there is a 0% residual stenosis.  Hypotension resolved during the procedure.  Continue aspirin  and Plavix .  He needs to stop smoking.  Findings Coronary Findings Diagnostic  Dominance: Right  Right Coronary Artery Prox RCA lesion is 90%  stenosed.  Intervention  Prox RCA lesion Stent CATH VISTA GUIDE 6FR JR4 guide catheter was inserted. Lesion crossed with guidewire using a WIRE ASAHI PROWATER 180CM. Pre-stent angioplasty was performed using a BALLOON EMERGE MR 2.0X12. A drug-eluting stent was successfully placed using a SYNERGY XD 2.50X16. Stent strut is well apposed. Post-stent angioplasty was performed using a BALLOON Chadwick EUPHORA RX 2.75X8. Post-Intervention Lesion Assessment The intervention was successful. Pre-interventional TIMI flow is 3.  Post-intervention TIMI flow is 3. No complications occurred at this lesion. There is a 0% residual stenosis post intervention.     ECHOCARDIOGRAM  ECHOCARDIOGRAM COMPLETE 11/01/2015  Narrative *Walker* *[facility]* [address] [place], [place] [address] [phone]  ------------------------------------------------------------------- Transthoracic Echocardiography  Patient:    Troy Little MR #:       [id number] Study Date: 11/01/2015 Gender:     M Age:        58 Height:     172.7 cm Weight:     75.8 kg BSA:        1.92 m^2 Pt. Status: Room:       5M15C  SONOGRAPHER  Alberta Lis, RVS TISA Rummer, Scott [id number] ATTENDING    Efrain Lamar ORN SONOGRAPHER  Tinnie Barefoot, RDCS, CCT PERFORMING   [facility], Inpatient  cc:  ------------------------------------------------------------------- LV EF: 55% -   60%  ------------------------------------------------------------------- Indications:      TIA 435.9.  ------------------------------------------------------------------- History:   Risk factors:  Diabetes mellitus. Dyslipidemia.  ------------------------------------------------------------------- Study Conclusions  - Left ventricle: The cavity size was normal. Wall thickness was normal. Systolic function was normal. The estimated ejection fraction was in the range of 55% to 60%. Wall motion was normal; there were no regional wall motion abnormalities. Doppler parameters are consistent with abnormal left ventricular relaxation (grade 1 diastolic dysfunction).  Impressions:  - Normal LV systolic function; grade 1 diastolic dysfunction; trace TR.  Transthoracic echocardiography.  M-mode, complete 2D, spectral Doppler, and color Doppler.  Birthdate:  Patient birthdate: [birth year].  Age:  Patient is 73 yr old.  Sex:  Gender: male. BMI: 25.4 kg/m^2.  Blood pressure:     138/76  Patient status: Inpatient.  Study date:  Study  date: 11/01/2015. Study time: 07:55 AM.  Location:  Bedside.  -------------------------------------------------------------------  ------------------------------------------------------------------- Left ventricle:  The cavity size was normal. Wall thickness was normal. Systolic function was normal. The estimated ejection fraction was in the range of 55% to 60%. Wall motion was normal; there were no regional wall motion abnormalities. Doppler parameters are consistent with abnormal left ventricular relaxation (grade 1 diastolic dysfunction).  ------------------------------------------------------------------- Aortic valve:   Trileaflet; normal thickness leaflets. Mobility was not restricted.  Doppler:  Transvalvular velocity was within the normal range. There was no stenosis. There was no regurgitation.  ------------------------------------------------------------------- Aorta:  Aortic root: The aortic root was normal in size.  ------------------------------------------------------------------- Mitral valve:   Structurally normal valve.   Mobility was not restricted.  Doppler:  Transvalvular velocity was within the normal range. There was no evidence for stenosis. There was no regurgitation.    Peak gradient (D): 3 mm Hg.  ------------------------------------------------------------------- Left atrium:  The atrium was normal in size.  ------------------------------------------------------------------- Right ventricle:  The cavity size was normal. Systolic function was normal.  ------------------------------------------------------------------- Pulmonic valve:    Doppler:  Transvalvular velocity was within the normal range. There was no evidence for stenosis.  ------------------------------------------------------------------- Tricuspid valve:   Structurally normal valve.    Doppler: Transvalvular velocity was within  the normal range. There was trivial  regurgitation.  ------------------------------------------------------------------- Right atrium:  The atrium was normal in size.  ------------------------------------------------------------------- Pericardium:  There was no pericardial effusion.  ------------------------------------------------------------------- Systemic veins: Inferior vena cava: The vessel was normal in size.  ------------------------------------------------------------------- Measurements  Left ventricle                         Value        Reference LV ID, ED, PLAX chordal        (L)     41.6  mm     43 - 52 LV ID, ES, PLAX chordal                24.6  mm     23 - 38 LV fx shortening, PLAX chordal         41    %      >=29 LV PW thickness, ED                    7.88  mm     --------- IVS/LV PW ratio, ED                    1.02         <=1.3 LV e&', lateral                         13.6  cm/s   --------- LV E/e&', lateral                       6.63         --------- LV e&', medial                          9.79  cm/s   --------- LV E/e&', medial                        9.21         --------- LV e&', average                         11.7  cm/s   --------- LV E/e&', average                       7.71         ---------  Ventricular septum                     Value        Reference IVS thickness, ED                      8.01  mm     ---------  LVOT                                   Value        Reference LVOT ID, S                             21    mm     --------- LVOT area  3.46  cm^2   ---------  Aorta                                  Value        Reference Aortic root ID, ED                     30    mm     ---------  Left atrium                            Value        Reference LA ID, A-P, ES                         34    mm     --------- LA ID/bsa, A-P                         1.77  cm/m^2 <=2.2 LA volume, S                           41.4  ml     --------- LA volume/bsa, S                        21.6  ml/m^2 --------- LA volume, ES, 1-p A4C                 34.3  ml     --------- LA volume/bsa, ES, 1-p A4C             17.9  ml/m^2 --------- LA volume, ES, 1-p A2C                 47.4  ml     --------- LA volume/bsa, ES, 1-p A2C             24.7  ml/m^2 ---------  Mitral valve                           Value        Reference Mitral E-wave peak velocity            90.2  cm/s   --------- Mitral A-wave peak velocity            106   cm/s   --------- Mitral deceleration time       (H)     243   ms     150 - 230 Mitral peak gradient, D                3     mm Hg  --------- Mitral E/A ratio, peak                 0.9          ---------  Systemic veins                         Value        Reference Estimated CVP                          3     mm Hg  ---------  Right ventricle  Value        Reference TAPSE                                  24.3  mm     --------- RV s&', lateral, S                      15.8  cm/s   ---------  Legend: (L)  and  (H)  mark values outside specified reference range.  ------------------------------------------------------------------- Prepared and Electronically Authenticated by  Redell Shallow 2017-02-11T14:25:40          ______________________________________________________________________________________________      Risk Assessment/Calculations:              Physical Exam:     VS:  BP 107/64   Pulse (!) 55   Ht 5' 8 (1.727 m)   Wt 151 lb (68.5 kg)   SpO2 97%   BMI 22.96 kg/m      Wt Readings from Last 3 Encounters:  06/11/24 161 lb (73 kg)  04/09/24 155 lb 6 oz (70.5 kg)  04/02/24 154 lb (69.9 kg)     GEN: Well nourished, well developed, in no acute distress NECK: No JVD; No carotid bruits CARDIAC: RRR, no murmurs, rubs, gallops RESPIRATORY:  Clear to auscultation without rales, wheezing or rhonchi  ABDOMEN: Soft, non-tender, non-distended, normal bowel sounds EXTREMITIES:  Warm and  well perfused, no edema; No deformity, 2+ radial pulses PSYCH: Normal mood and affect   Assessment & Plan Coronary artery disease of native artery of native heart with stable angina pectoris - Patient having ongoing chest pain which is concerning for CAD. -Last LHC was in 2021. -I recommend pursuing repeat LHC since he has high risk given ongoing tobacco use. -Will also get an echocardiogram as well. LHC Complete echo BMP, CBC Continue baby aspirin  81 MG daily Mixed hyperlipidemia - Needs lipids checked. Lipid panel Continue simvastatin  20 mg nightly for now Hypertension associated with diabetes (HCC) - His blood pressure is at goal. Continue lisinopril  40 mg daily Continue amlodipine  5 mg daily Continue Imdur  50 mg daily      Informed Consent   Shared Decision Making/Informed Consent The risks [stroke (1 in 1000), death (1 in 1000), kidney failure [usually temporary] (1 in 500), bleeding (1 in 200), allergic reaction [possibly serious] (1 in 200)], benefits (diagnostic support and management of coronary artery disease) and alternatives of a cardiac catheterization were discussed in detail with Mr. Krakow and he is willing to proceed.      This note was written with the assistance of a dictation microphone or AI dictation software. Please excuse any typos or grammatical errors.   Signed, Troy Archer, MD 07/31/2024 10:02 PM    [facility]

## 2024-07-31 NOTE — Progress Notes (Signed)
 Cardiology Office Note:   Date:  07/31/2024  ID:  Troy Little, DOB [birth year], MRN [id number] PCP: Purcell Emil Schanz, MD  [facility] Providers Cardiologist:  Georganna Archer, MD { Chief Complaint:  Chief Complaint  Patient presents with   Chest Pain       History of Present Illness:   Troy Little is a 73 y.o. male with a PMH of CAD s/p PCI to RCA (2021), HTN, HLD, DM 2, hepatitis C, prior polysubstance abuse (EtOH, cocaine), and tobacco use who presents as a new patient referral from the [place] for the management of CAD.  The patient was last seen by cardiology in 2021 after undergoing PCI to his RCA.  Today the patient reports that he has been having frequent chest pains that are concerning to him.  He describes having sharp central chest pains that occur sporadically.  He does not really exert himself so he is not sure if there is an exertional component.  He says that these episodes of chest pain can last for minutes to hours and are associated with diaphoresis, nausea, and SOB.  The symptoms have been going on for a long time; however, they have become much more frequent over the past 3 months.  He further endorses occasional palpitations, but denies syncope and presyncope.  The patient still smokes half a pack per day since 1973.  He denies alcohol  and illicit drug use but used to have issues with these.  He is taking all of his medications as prescribed without side effect.   Past Medical History:  Diagnosis Date   Agoraphobia    Anxiety    Arthritis    Coronary artery disease    1 stent   Diabetes mellitus without complication (HCC)    Essential Tremor    GERD (gastroesophageal reflux disease)    Headache    Hepatitis 2012   pt states he had Hep C but was treated   History of hiatal hernia    Hypercholesteremia    Hypertension    MDD (major depressive disorder)    Psychosis (HCC)    Stroke (HCC)    series of little mini strokes per pt      Studies Reviewed:    EKG:  EKG Interpretation Date/Time:  Wednesday August 01 2024 09:40:39 EST Ventricular Rate:  55 PR Interval:  140 QRS Duration:  88 QT Interval:  428 QTC Calculation: 409 R Axis:   88  Text Interpretation: Sinus bradycardia When compared with ECG of 15-Dec-2022 10:41, No significant change was found Confirmed by Archer Georganna [phone]) on 08/01/2024 10:01:59 AM     Cardiac Studies & Procedures   ______________________________________________________________________________________________ CARDIAC CATHETERIZATION  CARDIAC CATHETERIZATION 09/24/2019  Conclusion  The left ventricular systolic function is normal.  LV end diastolic pressure is low; left heart cath done due to low BP post sedation. Additional IV hydration was given for low BP  The left ventricular ejection fraction is 55-65% by visual estimate. LVEF checked since there was no recent LVEF on the chart and additional fluids were needed.  There is no aortic valve stenosis.  Prox RCA lesion is 90% stenosed.  A drug-eluting stent was successfully placed using a SYNERGY XD 2.50X16, postdilated to 2.75 mm.  Post intervention, there is a 0% residual stenosis.  Hypotension resolved during the procedure.  Continue aspirin  and Plavix .  He needs to stop smoking.  Findings Coronary Findings Diagnostic  Dominance: Right  Right Coronary Artery Prox RCA lesion is 90%  stenosed.  Intervention  Prox RCA lesion Stent CATH VISTA GUIDE 6FR JR4 guide catheter was inserted. Lesion crossed with guidewire using a WIRE ASAHI PROWATER 180CM. Pre-stent angioplasty was performed using a BALLOON EMERGE MR 2.0X12. A drug-eluting stent was successfully placed using a SYNERGY XD 2.50X16. Stent strut is well apposed. Post-stent angioplasty was performed using a BALLOON [place] EUPHORA RX 2.75X8. Post-Intervention Lesion Assessment The intervention was successful. Pre-interventional TIMI flow is 3.  Post-intervention TIMI flow is 3. No complications occurred at this lesion. There is a 0% residual stenosis post intervention.     ECHOCARDIOGRAM  ECHOCARDIOGRAM COMPLETE 11/01/2015  Narrative *[place]* *[facility]* [address] [place], [place] [address] [phone]  ------------------------------------------------------------------- Transthoracic Echocardiography  Patient:    Troy Little, Troy Little MR #:       [id number] Study Date: 11/01/2015 Gender:     M Age:        63 Height:     172.7 cm Weight:     75.8 kg BSA:        1.92 m^2 Pt. Status: Room:       5M15C  SONOGRAPHER  Alberta Lis, RVS TISA Rummer, Scott [id number] ATTENDING    Efrain Lamar ORN SONOGRAPHER  Tinnie Barefoot, RDCS, CCT PERFORMING   [facility], Inpatient  cc:  ------------------------------------------------------------------- LV EF: 55% -   60%  ------------------------------------------------------------------- Indications:      TIA 435.9.  ------------------------------------------------------------------- History:   Risk factors:  Diabetes mellitus. Dyslipidemia.  ------------------------------------------------------------------- Study Conclusions  - Left ventricle: The cavity size was normal. Wall thickness was normal. Systolic function was normal. The estimated ejection fraction was in the range of 55% to 60%. Wall motion was normal; there were no regional wall motion abnormalities. Doppler parameters are consistent with abnormal left ventricular relaxation (grade 1 diastolic dysfunction).  Impressions:  - Normal LV systolic function; grade 1 diastolic dysfunction; trace TR.  Transthoracic echocardiography.  M-mode, complete 2D, spectral Doppler, and color Doppler.  Birthdate:  Patient birthdate: 1951-09-03.  Age:  Patient is 73 yr old.  Sex:  Gender: male. BMI: 25.4 kg/m^2.  Blood pressure:     138/76  Patient status: Inpatient.  Study date:  Study  date: 11/01/2015. Study time: 07:55 AM.  Location:  Bedside.  -------------------------------------------------------------------  ------------------------------------------------------------------- Left ventricle:  The cavity size was normal. Wall thickness was normal. Systolic function was normal. The estimated ejection fraction was in the range of 55% to 60%. Wall motion was normal; there were no regional wall motion abnormalities. Doppler parameters are consistent with abnormal left ventricular relaxation (grade 1 diastolic dysfunction).  ------------------------------------------------------------------- Aortic valve:   Trileaflet; normal thickness leaflets. Mobility was not restricted.  Doppler:  Transvalvular velocity was within the normal range. There was no stenosis. There was no regurgitation.  ------------------------------------------------------------------- Aorta:  Aortic root: The aortic root was normal in size.  ------------------------------------------------------------------- Mitral valve:   Structurally normal valve.   Mobility was not restricted.  Doppler:  Transvalvular velocity was within the normal range. There was no evidence for stenosis. There was no regurgitation.    Peak gradient (D): 3 mm Hg.  ------------------------------------------------------------------- Left atrium:  The atrium was normal in size.  ------------------------------------------------------------------- Right ventricle:  The cavity size was normal. Systolic function was normal.  ------------------------------------------------------------------- Pulmonic valve:    Doppler:  Transvalvular velocity was within the normal range. There was no evidence for stenosis.  ------------------------------------------------------------------- Tricuspid valve:   Structurally normal valve.    Doppler: Transvalvular velocity was within  the normal range. There was trivial  regurgitation.  ------------------------------------------------------------------- Right atrium:  The atrium was normal in size.  ------------------------------------------------------------------- Pericardium:  There was no pericardial effusion.  ------------------------------------------------------------------- Systemic veins: Inferior vena cava: The vessel was normal in size.  ------------------------------------------------------------------- Measurements  Left ventricle                         Value        Reference LV ID, ED, PLAX chordal        (L)     41.6  mm     43 - 52 LV ID, ES, PLAX chordal                24.6  mm     23 - 38 LV fx shortening, PLAX chordal         41    %      >=29 LV PW thickness, ED                    7.88  mm     --------- IVS/LV PW ratio, ED                    1.02         <=1.3 LV e&', lateral                         13.6  cm/s   --------- LV E/e&', lateral                       6.63         --------- LV e&', medial                          9.79  cm/s   --------- LV E/e&', medial                        9.21         --------- LV e&', average                         11.7  cm/s   --------- LV E/e&', average                       7.71         ---------  Ventricular septum                     Value        Reference IVS thickness, ED                      8.01  mm     ---------  LVOT                                   Value        Reference LVOT ID, S                             21    mm     --------- LVOT area  3.46  cm^2   ---------  Aorta                                  Value        Reference Aortic root ID, ED                     30    mm     ---------  Left atrium                            Value        Reference LA ID, A-P, ES                         34    mm     --------- LA ID/bsa, A-P                         1.77  cm/m^2 <=2.2 LA volume, S                           41.4  ml     --------- LA volume/bsa, S                        21.6  ml/m^2 --------- LA volume, ES, 1-p A4C                 34.3  ml     --------- LA volume/bsa, ES, 1-p A4C             17.9  ml/m^2 --------- LA volume, ES, 1-p A2C                 47.4  ml     --------- LA volume/bsa, ES, 1-p A2C             24.7  ml/m^2 ---------  Mitral valve                           Value        Reference Mitral E-wave peak velocity            90.2  cm/s   --------- Mitral A-wave peak velocity            106   cm/s   --------- Mitral deceleration time       (H)     243   ms     150 - 230 Mitral peak gradient, D                3     mm Hg  --------- Mitral E/A ratio, peak                 0.9          ---------  Systemic veins                         Value        Reference Estimated CVP                          3     mm Hg  ---------  Right ventricle  Value        Reference TAPSE                                  24.3  mm     --------- RV s&', lateral, S                      15.8  cm/s   ---------  Legend: (L)  and  (H)  mark values outside specified reference range.  ------------------------------------------------------------------- Prepared and Electronically Authenticated by  Redell Shallow 2017-02-11T14:25:40          ______________________________________________________________________________________________      Risk Assessment/Calculations:              Physical Exam:     VS:  BP 107/64   Pulse (!) 55   Ht 5' 8 (1.727 m)   Wt 151 lb (68.5 kg)   SpO2 97%   BMI 22.96 kg/m      Wt Readings from Last 3 Encounters:  06/11/24 161 lb (73 kg)  04/09/24 155 lb 6 oz (70.5 kg)  04/02/24 154 lb (69.9 kg)     GEN: Well nourished, well developed, in no acute distress NECK: No JVD; No carotid bruits CARDIAC: RRR, no murmurs, rubs, gallops RESPIRATORY:  Clear to auscultation without rales, wheezing or rhonchi  ABDOMEN: Soft, non-tender, non-distended, normal bowel sounds EXTREMITIES:  Warm and  well perfused, no edema; No deformity, 2+ radial pulses PSYCH: Normal mood and affect   Assessment & Plan Coronary artery disease of native artery of native heart with stable angina pectoris - Patient having ongoing chest pain which is concerning for CAD. -Last LHC was in 2021. -I recommend pursuing repeat LHC since he has high risk given ongoing tobacco use. -Will also get an echocardiogram as well. LHC Complete echo BMP, CBC Continue baby aspirin  81 MG daily Mixed hyperlipidemia - Needs lipids checked. Lipid panel Continue simvastatin  20 mg nightly for now Hypertension associated with diabetes (HCC) - His blood pressure is at goal. Continue lisinopril  40 mg daily Continue amlodipine  5 mg daily Continue Imdur  50 mg daily      Informed Consent   Shared Decision Making/Informed Consent The risks [stroke (1 in 1000), death (1 in 1000), kidney failure [usually temporary] (1 in 500), bleeding (1 in 200), allergic reaction [possibly serious] (1 in 200)], benefits (diagnostic support and management of coronary artery disease) and alternatives of a cardiac catheterization were discussed in detail with Mr. Puff and he is willing to proceed.      This note was written with the assistance of a dictation microphone or AI dictation software. Please excuse any typos or grammatical errors.   Signed, Georganna Archer, MD 07/31/2024 10:02 PM    [facility]

## 2024-08-01 ENCOUNTER — Ambulatory Visit
Attending: Student in an Organized Health Care Education/Training Program | Admitting: Student in an Organized Health Care Education/Training Program

## 2024-08-01 VITALS — BP 107/64 | HR 55 | Ht 68.0 in | Wt 151.0 lb

## 2024-08-01 DIAGNOSIS — I209 Angina pectoris, unspecified: Secondary | ICD-10-CM

## 2024-08-01 DIAGNOSIS — M4712 Other spondylosis with myelopathy, cervical region: Secondary | ICD-10-CM | POA: Diagnosis not present

## 2024-08-01 DIAGNOSIS — M4722 Other spondylosis with radiculopathy, cervical region: Secondary | ICD-10-CM

## 2024-08-01 DIAGNOSIS — E782 Mixed hyperlipidemia: Secondary | ICD-10-CM

## 2024-08-01 DIAGNOSIS — I25118 Atherosclerotic heart disease of native coronary artery with other forms of angina pectoris: Secondary | ICD-10-CM | POA: Diagnosis not present

## 2024-08-01 DIAGNOSIS — G459 Transient cerebral ischemic attack, unspecified: Secondary | ICD-10-CM

## 2024-08-01 DIAGNOSIS — E1159 Type 2 diabetes mellitus with other circulatory complications: Secondary | ICD-10-CM | POA: Diagnosis not present

## 2024-08-01 DIAGNOSIS — I152 Hypertension secondary to endocrine disorders: Secondary | ICD-10-CM

## 2024-08-01 NOTE — Assessment & Plan Note (Signed)
-   Needs lipids checked. Lipid panel Continue simvastatin  20 mg nightly for now

## 2024-08-01 NOTE — Assessment & Plan Note (Addendum)
-   His blood pressure is at goal. Continue lisinopril  40 mg daily Continue amlodipine  5 mg daily Continue Imdur  50 mg daily

## 2024-08-01 NOTE — Patient Instructions (Signed)
 Medication Instructions:  Your physician recommends that you continue on your current medications as directed. Please refer to the Current Medication list given to you today.  *If you need a refill on your cardiac medications before your next appointment, please call your pharmacy*  Lab Work: Have lab work done in the lab on the first floor today--BMP, CBC, Lipids If you have labs (blood work) drawn today and your tests are completely normal, you will receive your results only by: MyChart Message (if you have MyChart) OR A paper copy in the mail If you have any lab test that is abnormal or we need to change your treatment, we will call you to review the results.  Testing/Procedures: Your physician has requested that you have an echocardiogram. Echocardiography is a painless test that uses sound waves to create images of your heart. It provides your doctor with information about the size and shape of your heart and how well your heart's chambers and valves are working. This procedure takes approximately one hour. There are no restrictions for this procedure. Please do NOT wear cologne, perfume, aftershave, or lotions (deodorant is allowed). Please arrive 15 minutes prior to your appointment time.  Please note: We ask at that you not bring children with you during ultrasound (echo/ vascular) testing. Due to room size and safety concerns, children are not allowed in the ultrasound rooms during exams. Our front office staff cannot provide observation of children in our lobby area while testing is being conducted. An adult accompanying a patient to their appointment will only be allowed in the ultrasound room at the discretion of the ultrasound technician under special circumstances. We apologize for any inconvenience.  Your physician has requested that you have a cardiac catheterization. Cardiac catheterization is used to diagnose and/or treat various heart conditions. Doctors may recommend this  procedure for a number of different reasons. The most common reason is to evaluate chest pain. Chest pain can be a symptom of coronary artery disease (CAD), and cardiac catheterization can show whether plaque is narrowing or blocking your heart's arteries. This procedure is also used to evaluate the valves, as well as measure the blood flow and oxygen levels in different parts of your heart. For further information please visit [email or web address] Please follow instruction sheet, as given. Scheduled for November 17  Follow-Up: At [facility], you and your health needs are our priority.  As part of our continuing mission to provide you with exceptional heart care, our providers are all part of one team.  This team includes your primary Cardiologist (physician) and Advanced Practice Providers or APPs (Physician Assistants and Nurse Practitioners) who all work together to provide you with the care you need, when you need it.  Your next appointment:   2 -3 week(s)  Provider:   One of our Advanced Practice Providers (APPs): Morse Clause, PA-C  Lamarr Satterfield, NP Miriam Shams, NP  Olivia Pavy, PA-C Josefa Beauvais, NP  Leontine Salen, PA-C Orren Fabry, PA-C  [place], [place] Jackee Alberts, NP  Damien Braver, NP Jon Hails, PA-C  Waddell Donath, PA-C    Dayna Dunn, PA-C  Scott Weaver, PA-C Lum Louis, NP Katlyn West, NP Callie Goodrich, PA-C  Xika Zhao, NP Sheng Haley, PA-C    Kathleen Johnson, PA-C   Then, Georganna Archer, MD will plan to see you again in 6 month(s).    We recommend signing up for the patient portal called MyChart.  Sign up information is provided on this After Visit Summary.  MyChart is used to connect with patients for Virtual Visits (Telemedicine).  Patients are able to view lab/test results, encounter notes, upcoming appointments, etc.  Non-urgent messages can be sent to your provider as well.   To learn more about what you can do with MyChart, go to  [email or web address].   Other Instructions  [facility] A DEPT OF [place]. [facility] AT [address] A DEPT OF THE [place]. [facility] [address] [place] [address] Dept: [phone] Loc: [phone]  OZIL STETTLER  08/01/2024  You are scheduled for a Cardiac Catheterization on Monday, November 17 with Dr. Lonni End.  1. Please arrive at the [facility] (Main Entrance A) at [facility]: [address] [place], [place] [address] at 7:00 AM (This time is 2 hour(s) before your procedure to ensure your preparation).   Free valet parking service is available. You will check in at ADMITTING. The support person will be asked to wait in the waiting room.  It is OK to have someone drop you off and come back when you are ready to be discharged.    Special note: Every effort is made to have your procedure done on time. Please understand that emergencies sometimes delay scheduled procedures.  2. Diet: Nothing to eat after midnight.   3. Hydration: You need to be well hydrated before your procedure. On November 17, you may drink approved liquids (see below) until 2 hours before the procedure, with 16 oz of water as your last intake.   List of approved liquids water, clear juice, clear tea, black coffee, fruit juices, non-citric and without pulp, carbonated beverages, Gatorade, Kool -Aid, plain Jello-O and plain ice popsicles.  4. Labs: You will need to have blood drawn on Wednesday, November 12 at [facility] (1st Floor), [address], [place], [place] [address]. You do not need to be fasting.  5. Medication instructions in preparation for your procedure: Do not take metformin  the morning of the procedure and for 48 hours after the procedure   Contrast Allergy: No   On the morning of your procedure, take your Aspirin  81 mg and any morning medicines NOT listed above.  You may use sips  of water.  6. Plan to go home the same day, you will only stay overnight if medically necessary. 7. Bring a current list of your medications and current insurance cards. 8. You MUST have a responsible person to drive you home. 9. Someone MUST be with you the first 24 hours after you arrive home or your discharge will be delayed. 10. Please wear clothes that are easy to get on and off and wear slip-on shoes.  Thank you for allowing us  to care for you!   -- [facility]

## 2024-08-02 ENCOUNTER — Telehealth: Payer: Self-pay | Admitting: *Deleted

## 2024-08-02 ENCOUNTER — Ambulatory Visit: Payer: Self-pay | Admitting: Student in an Organized Health Care Education/Training Program

## 2024-08-02 LAB — BASIC METABOLIC PANEL WITH GFR
BUN/Creatinine Ratio: 9 — ABNORMAL LOW (ref 10–24)
BUN: 9 mg/dL (ref 8–27)
CO2: 27 mmol/L (ref 20–29)
Calcium: 9.6 mg/dL (ref 8.6–10.2)
Chloride: 102 mmol/L (ref 96–106)
Creatinine, Ser: 0.95 mg/dL (ref 0.76–1.27)
Glucose: 128 mg/dL — ABNORMAL HIGH (ref 70–99)
Potassium: 4.2 mmol/L (ref 3.5–5.2)
Sodium: 140 mmol/L (ref 134–144)
eGFR: 86 mL/min/1.73 (ref >59)

## 2024-08-02 LAB — CBC
Hematocrit: 42.9 % (ref 37.5–51.0)
Hemoglobin: 13.6 g/dL (ref 13.0–17.7)
MCH: 26.2 pg — ABNORMAL LOW (ref 26.6–33.0)
MCHC: 31.7 g/dL (ref 31.5–35.7)
MCV: 83 fL (ref 79–97)
Platelets: 169 x10E3/uL (ref 150–450)
RBC: 5.19 x10E6/uL (ref 4.14–5.80)
RDW: 13.8 % (ref 11.6–15.4)
WBC: 5.7 x10E3/uL (ref 3.4–10.8)

## 2024-08-02 LAB — LIPID PANEL
Chol/HDL Ratio: 2.8 ratio (ref 0.0–5.0)
Cholesterol, Total: 116 mg/dL (ref 100–199)
HDL: 42 mg/dL (ref >39)
LDL Chol Calc (NIH): 56 mg/dL (ref 0–99)
Triglycerides: 93 mg/dL (ref 0–149)
VLDL Cholesterol Cal: 18 mg/dL (ref 5–40)

## 2024-08-02 NOTE — Telephone Encounter (Signed)
 Cardiac Catheterization scheduled at [facility] for: Monday August 06, 2024 9 AM Arrival time [facility] Main Entrance A at: 7 AM  Diet: -Nothing to eat after midnight.  Hydration: -May drink clear liquids until 2 hours before the procedure.  Approved liquids: Water, clear tea, black coffee, fruit juices-non-citric and without pulp,Gatorade, plain Jello/popsicles.   -Please drink 16 oz of water 2 hours before procedure.  Medication instructions: -Hold:  Metformin -day of procedure and 48 hours after -Other usual morning medications can be taken including aspirin  81 mg.  Plan to go home the same day, you will only stay overnight if medically necessary.  You must have responsible adult to drive you home.  Someone must be with you the first 24 hours after you arrive home.  Reviewed procedure instructions with patient.

## 2024-08-05 ENCOUNTER — Other Ambulatory Visit: Payer: Self-pay | Admitting: Emergency Medicine

## 2024-08-05 DIAGNOSIS — R399 Unspecified symptoms and signs involving the genitourinary system: Secondary | ICD-10-CM

## 2024-08-06 ENCOUNTER — Ambulatory Visit: Payer: Self-pay | Admitting: Student in an Organized Health Care Education/Training Program

## 2024-08-06 ENCOUNTER — Encounter (HOSPITAL_COMMUNITY): Payer: Self-pay | Admitting: Internal Medicine

## 2024-08-06 ENCOUNTER — Ambulatory Visit (HOSPITAL_COMMUNITY)
Admission: RE | Admit: 2024-08-06 | Discharge: 2024-08-06 | Disposition: A | Discharge status: Home / Self Care | Attending: Internal Medicine | Admitting: Internal Medicine

## 2024-08-06 ENCOUNTER — Encounter (HOSPITAL_COMMUNITY)
Admission: RE | Disposition: A | Discharge status: Home / Self Care | Payer: Self-pay | Source: Home / Self Care | Attending: Internal Medicine

## 2024-08-06 ENCOUNTER — Other Ambulatory Visit: Payer: Self-pay

## 2024-08-06 DIAGNOSIS — E782 Mixed hyperlipidemia: Secondary | ICD-10-CM | POA: Insufficient documentation

## 2024-08-06 DIAGNOSIS — I25110 Atherosclerotic heart disease of native coronary artery with unstable angina pectoris: Secondary | ICD-10-CM

## 2024-08-06 DIAGNOSIS — Z955 Presence of coronary angioplasty implant and graft: Secondary | ICD-10-CM | POA: Diagnosis not present

## 2024-08-06 DIAGNOSIS — Z7902 Long term (current) use of antithrombotics/antiplatelets: Secondary | ICD-10-CM | POA: Diagnosis not present

## 2024-08-06 DIAGNOSIS — I152 Hypertension secondary to endocrine disorders: Secondary | ICD-10-CM | POA: Diagnosis not present

## 2024-08-06 DIAGNOSIS — E1159 Type 2 diabetes mellitus with other circulatory complications: Secondary | ICD-10-CM | POA: Diagnosis not present

## 2024-08-06 DIAGNOSIS — F1011 Alcohol abuse, in remission: Secondary | ICD-10-CM | POA: Diagnosis not present

## 2024-08-06 DIAGNOSIS — F17210 Nicotine dependence, cigarettes, uncomplicated: Secondary | ICD-10-CM | POA: Insufficient documentation

## 2024-08-06 DIAGNOSIS — F1410 Cocaine abuse, uncomplicated: Secondary | ICD-10-CM | POA: Insufficient documentation

## 2024-08-06 DIAGNOSIS — Z7982 Long term (current) use of aspirin: Secondary | ICD-10-CM | POA: Insufficient documentation

## 2024-08-06 DIAGNOSIS — Z79899 Other long term (current) drug therapy: Secondary | ICD-10-CM | POA: Insufficient documentation

## 2024-08-06 HISTORY — PX: LEFT HEART CATH AND CORONARY ANGIOGRAPHY: CATH118249

## 2024-08-06 HISTORY — PX: CORONARY PRESSURE/FFR STUDY: CATH118243

## 2024-08-06 LAB — POCT ACTIVATED CLOTTING TIME
Activated Clotting Time: 239 s
Activated Clotting Time: 256 s

## 2024-08-06 LAB — GLUCOSE, CAPILLARY: Glucose-Capillary: 119 mg/dL — ABNORMAL HIGH (ref 70–99)

## 2024-08-06 SURGERY — LEFT HEART CATH AND CORONARY ANGIOGRAPHY
Anesthesia: LOCAL

## 2024-08-06 MED ORDER — SODIUM CHLORIDE 0.9 % IV SOLN: 250.0000 mL | INTRAVENOUS | Status: DC | PRN

## 2024-08-06 MED ORDER — VERAPAMIL HCL 2.5 MG/ML IV SOLN
INTRAVENOUS | Status: AC
  Filled 2024-08-06: qty 2

## 2024-08-06 MED ORDER — MIDAZOLAM HCL (PF) 2 MG/2ML IJ SOLN
INTRAMUSCULAR | Status: DC | PRN
Start: 2024-08-06 — End: 2024-08-06
  Administered 2024-08-06: 1 mg via INTRAVENOUS

## 2024-08-06 MED ORDER — ONDANSETRON HCL 4 MG/2ML IJ SOLN: 4.0000 mg | Freq: Four times a day (QID) | INTRAMUSCULAR | Status: DC | PRN

## 2024-08-06 MED ORDER — FREE WATER
500.0000 mL | Freq: Once | Status: AC
  Administered 2024-08-06: 500 mL via ORAL

## 2024-08-06 MED ORDER — IOHEXOL 350 MG/ML SOLN
INTRAVENOUS | Status: DC | PRN
Start: 2024-08-06 — End: 2024-08-06
  Administered 2024-08-06: 88 mL

## 2024-08-06 MED ORDER — VERAPAMIL HCL 2.5 MG/ML IV SOLN
INTRAVENOUS | Status: DC | PRN
  Administered 2024-08-06: 10 mL via INTRA_ARTERIAL

## 2024-08-06 MED ORDER — LABETALOL HCL 5 MG/ML IV SOLN: 10.0000 mg | INTRAVENOUS | Status: DC | PRN

## 2024-08-06 MED ORDER — NITROGLYCERIN 1 MG/10 ML FOR IR/CATH LAB
INTRA_ARTERIAL | Status: DC | PRN
Start: 2024-08-06 — End: 2024-08-06
  Administered 2024-08-06 (×2): 100 ug via INTRA_ARTERIAL

## 2024-08-06 MED ORDER — HEPARIN SODIUM (PORCINE) 1000 UNIT/ML IJ SOLN
INTRAMUSCULAR | Status: DC | PRN
  Administered 2024-08-06: 3500 [IU] via INTRAVENOUS
  Administered 2024-08-06: 2000 [IU] via INTRAVENOUS
  Administered 2024-08-06: 3500 [IU] via INTRAVENOUS

## 2024-08-06 MED ORDER — NITROGLYCERIN 1 MG/10 ML FOR IR/CATH LAB
INTRA_ARTERIAL | Status: AC
  Filled 2024-08-06: qty 10

## 2024-08-06 MED ORDER — ACETAMINOPHEN 325 MG PO TABS: 650.0000 mg | ORAL_TABLET | ORAL | Status: DC | PRN

## 2024-08-06 MED ORDER — HEPARIN SODIUM (PORCINE) 1000 UNIT/ML IJ SOLN
INTRAMUSCULAR | Status: AC
  Filled 2024-08-06: qty 10

## 2024-08-06 MED ORDER — SODIUM CHLORIDE 0.9% FLUSH: 3.0000 mL | INTRAVENOUS | Status: DC | PRN

## 2024-08-06 MED ORDER — FENTANYL CITRATE (PF) 100 MCG/2ML IJ SOLN
INTRAMUSCULAR | Status: DC | PRN
  Administered 2024-08-06: 25 ug via INTRAVENOUS

## 2024-08-06 MED ORDER — ASPIRIN 81 MG PO CHEW: 81.0000 mg | CHEWABLE_TABLET | ORAL | Status: DC

## 2024-08-06 MED ORDER — MIDAZOLAM HCL 2 MG/2ML IJ SOLN
INTRAMUSCULAR | Status: AC
Start: 2024-08-06 — End: 2024-08-06
  Filled 2024-08-06: qty 2

## 2024-08-06 MED ORDER — SODIUM CHLORIDE 0.9% FLUSH: 3.0000 mL | Freq: Two times a day (BID) | INTRAVENOUS | Status: DC

## 2024-08-06 MED ORDER — LIDOCAINE HCL (PF) 1 % IJ SOLN
INTRAMUSCULAR | Status: DC | PRN
  Administered 2024-08-06: 2 mL

## 2024-08-06 MED ORDER — LIDOCAINE HCL (PF) 1 % IJ SOLN
INTRAMUSCULAR | Status: AC
Start: 2024-08-06 — End: 2024-08-06
  Filled 2024-08-06: qty 30

## 2024-08-06 MED ORDER — HEPARIN (PORCINE) IN NACL 1000-0.9 UT/500ML-% IV SOLN
INTRAVENOUS | Status: DC | PRN
  Administered 2024-08-06 (×2): 500 mL

## 2024-08-06 MED ORDER — FENTANYL CITRATE (PF) 100 MCG/2ML IJ SOLN
INTRAMUSCULAR | Status: AC
  Filled 2024-08-06: qty 2

## 2024-08-06 MED ORDER — HYDRALAZINE HCL 20 MG/ML IJ SOLN: 10.0000 mg | INTRAMUSCULAR | Status: DC | PRN

## 2024-08-06 MED ORDER — FREE WATER: 500.0000 mL | Freq: Once | Status: DC

## 2024-08-06 SURGICAL SUPPLY — 10 items
CATH INFINITI 5 FR JL3.5 (CATHETERS) IMPLANT
CATH INFINITI JR4 5F (CATHETERS) IMPLANT
CATH LAUNCHER 6FR EBU3.5 (CATHETERS) IMPLANT
DEVICE RAD COMP TR BAND LRG (VASCULAR PRODUCTS) IMPLANT
GLIDESHEATH SLEND SS 6F .021 (SHEATH) IMPLANT
GUIDEWIRE INQWIRE 1.5J.035X260 (WIRE) IMPLANT
GUIDEWIRE PRESSURE X 175 (WIRE) IMPLANT
KIT ESSENTIALS PG (KITS) IMPLANT
PACK CARDIAC CATHETERIZATION (CUSTOM PROCEDURE TRAY) ×2 IMPLANT
SET ATX-X65L (MISCELLANEOUS) IMPLANT

## 2024-08-06 NOTE — Interval H&P Note (Signed)
 History and Physical Interval Note:  08/06/2024 8:31 AM  Troy Little Anon  has presented today for surgery, with the diagnosis of unstable angina.  The various methods of treatment have been discussed with the patient and family. After consideration of risks, benefits and other options for treatment, the patient has consented to  Procedure(s): LEFT HEART CATH AND CORONARY ANGIOGRAPHY (N/A) as a surgical intervention.  The patient's history has been reviewed, patient examined, no change in status, stable for surgery.  I have reviewed the patient's chart and labs.  Questions were answered to the patient's satisfaction.    Cath Lab Visit (complete for each Cath Lab visit)  Clinical Evaluation Leading to the Procedure:   ACS: No.  Non-ACS:    Anginal Classification: CCS IV  Anti-ischemic medical therapy: Maximal Therapy (2 or more classes of medications)  Non-Invasive Test Results: No non-invasive testing performed  Prior CABG: No previous CABG  Troy Little

## 2024-08-06 NOTE — Discharge Instructions (Signed)
NO METFORMIN/GLUCOPHAGE  FOR 2 DAYS

## 2024-08-13 ENCOUNTER — Ambulatory Visit: Admitting: Emergency Medicine

## 2024-08-13 ENCOUNTER — Encounter: Payer: Self-pay | Admitting: Emergency Medicine

## 2024-08-13 VITALS — BP 128/64 | HR 57 | Temp 98.3°F | Ht 68.0 in | Wt 150.0 lb

## 2024-08-13 DIAGNOSIS — E785 Hyperlipidemia, unspecified: Secondary | ICD-10-CM | POA: Diagnosis not present

## 2024-08-13 DIAGNOSIS — G250 Essential tremor: Secondary | ICD-10-CM

## 2024-08-13 DIAGNOSIS — F17200 Nicotine dependence, unspecified, uncomplicated: Secondary | ICD-10-CM

## 2024-08-13 DIAGNOSIS — E1169 Type 2 diabetes mellitus with other specified complication: Secondary | ICD-10-CM

## 2024-08-13 DIAGNOSIS — R399 Unspecified symptoms and signs involving the genitourinary system: Secondary | ICD-10-CM

## 2024-08-13 DIAGNOSIS — I152 Hypertension secondary to endocrine disorders: Secondary | ICD-10-CM

## 2024-08-13 DIAGNOSIS — E1159 Type 2 diabetes mellitus with other circulatory complications: Secondary | ICD-10-CM

## 2024-08-13 DIAGNOSIS — Z7984 Long term (current) use of oral hypoglycemic drugs: Secondary | ICD-10-CM

## 2024-08-13 LAB — POCT GLYCOSYLATED HEMOGLOBIN (HGB A1C): HbA1c POC (<> result, manual entry): 6.7 %

## 2024-08-13 NOTE — Assessment & Plan Note (Signed)
 BP Readings from Last 3 Encounters:  08/13/24 128/64  08/06/24 126/63  08/01/24 107/64   Lab Results  Component Value Date   HGBA1C 6.7 08/13/2024   Well-controlled hypertension and diabetes Continue lisinopril  40 mg daily and amlodipine  10 mg daily Continues metformin  500 mg 3 times a day Cardiovascular risks associated with hypertension and diabetes discussed Diet and nutrition discussed Follow-up in 6 months

## 2024-08-13 NOTE — Assessment & Plan Note (Signed)
Chronic stable conditions Continue simvastatin 20 mg daily Diet and nutrition discussed

## 2024-08-13 NOTE — Assessment & Plan Note (Signed)
Stable. Continues propranolol 10 mg 3 times a day

## 2024-08-13 NOTE — Assessment & Plan Note (Signed)
 Cardiovascular and cancer risks associated with smoking discussed Cessation advice given Not ready to quit

## 2024-08-13 NOTE — Assessment & Plan Note (Signed)
 Well-controlled symptoms Continues Flomax  0.4 mg at bedtime

## 2024-08-13 NOTE — Patient Instructions (Signed)
 Health Maintenance After Age 71 After age 27, you are at a higher risk for certain long-term diseases and infections as well as injuries from falls. Falls are a major cause of broken bones and head injuries in people who are older than age 73. Getting regular preventive care can help to keep you healthy and well. Preventive care includes getting regular testing and making lifestyle changes as recommended by your health care provider. Talk with your health care provider about: Which screenings and tests you should have. A screening is a test that checks for a disease when you have no symptoms. A diet and exercise plan that is right for you. What should I know about screenings and tests to prevent falls? Screening and testing are the best ways to find a health problem early. Early diagnosis and treatment give you the best chance of managing medical conditions that are common after age 90. Certain conditions and lifestyle choices may make you more likely to have a fall. Your health care provider may recommend: Regular vision checks. Poor vision and conditions such as cataracts can make you more likely to have a fall. If you wear glasses, make sure to get your prescription updated if your vision changes. Medicine review. Work with your health care provider to regularly review all of the medicines you are taking, including over-the-counter medicines. Ask your health care provider about any side effects that may make you more likely to have a fall. Tell your health care provider if any medicines that you take make you feel dizzy or sleepy. Strength and balance checks. Your health care provider may recommend certain tests to check your strength and balance while standing, walking, or changing positions. Foot health exam. Foot pain and numbness, as well as not wearing proper footwear, can make you more likely to have a fall. Screenings, including: Osteoporosis screening. Osteoporosis is a condition that causes  the bones to get weaker and break more easily. Blood pressure screening. Blood pressure changes and medicines to control blood pressure can make you feel dizzy. Depression screening. You may be more likely to have a fall if you have a fear of falling, feel depressed, or feel unable to do activities that you used to do. Alcohol  use screening. Using too much alcohol  can affect your balance and may make you more likely to have a fall. Follow these instructions at home: Lifestyle Do not drink alcohol  if: Your health care provider tells you not to drink. If you drink alcohol : Limit how much you have to: 0-1 drink a day for women. 0-2 drinks a day for men. Know how much alcohol  is in your drink. In the [place] one drink equals one 12 oz bottle of beer (355 mL), one 5 oz glass of wine (148 mL), or one 1 oz glass of hard liquor (44 mL). Do not use any products that contain nicotine or tobacco. These products include cigarettes, chewing tobacco, and vaping devices, such as e-cigarettes. If you need help quitting, ask your health care provider. Activity  Follow a regular exercise program to stay fit. This will help you maintain your balance. Ask your health care provider what types of exercise are appropriate for you. If you need a cane or walker, use it as recommended by your health care provider. Wear supportive shoes that have nonskid soles. Safety  Remove any tripping hazards, such as rugs, cords, and clutter. Install safety equipment such as grab bars in bathrooms and safety rails on stairs. Keep rooms and walkways  well-lit. General instructions Talk with your health care provider about your risks for falling. Tell your health care provider if: You fall. Be sure to tell your health care provider about all falls, even ones that seem minor. You feel dizzy, tiredness (fatigue), or off-balance. Take over-the-counter and prescription medicines only as told by your health care provider. These include  supplements. Eat a healthy diet and maintain a healthy weight. A healthy diet includes low-fat dairy products, low-fat (lean) meats, and fiber from whole grains, beans, and lots of fruits and vegetables. Stay current with your vaccines. Schedule regular health, dental, and eye exams. Summary Having a healthy lifestyle and getting preventive care can help to protect your health and wellness after age 15. Screening and testing are the best way to find a health problem early and help you avoid having a fall. Early diagnosis and treatment give you the best chance for managing medical conditions that are more common for people who are older than age 42. Falls are a major cause of broken bones and head injuries in people who are older than age 64. Take precautions to prevent a fall at home. Work with your health care provider to learn what changes you can make to improve your health and wellness and to prevent falls. This information is not intended to replace advice given to you by your health care provider. Make sure you discuss any questions you have with your health care provider. Document Revised: 01/26/2021 Document Reviewed: 01/26/2021 Elsevier Patient Education  2024 [employer].

## 2024-08-13 NOTE — Progress Notes (Signed)
 Troy Little 73 y.o.   Chief Complaint  Patient presents with   Follow-up    HISTORY OF PRESENT ILLNESS: This is a 73 y.o. male here for follow-up of chronic medical conditions including diabetes and hypertension Recently had cardiac cath which looked pretty good.  No concerns. Also had echocardiogram. Patient has no complaints or any other medical concerns today.  HPI   Prior to Admission medications   Medication Sig Start Date End Date Taking? Authorizing Provider  Alcohol  Swabs (ALCOHOL  PREP PAD) 70 % PADS USE PAD TO AFFECTED AREA AS DIRECTED 06/16/22   [provider]  Alcohol  Swabs (DROPSAFE ALCOHOL  PREP) 70 % PADS USE AS DIRECTED 06/05/24   Purcell Emil Schanz, MD  amLODipine  (NORVASC ) 10 MG tablet Take 10 mg by mouth daily. 07/08/22   [provider]  aspirin  EC 81 MG tablet Take 81 mg by mouth daily.    [provider]  blood glucose meter kit and supplies Dispense based on patient and insurance preference. Use to test in the morning, noon, and bedtime as directed. (FOR ICD-10 E10.9, E11.9). 10/30/20   Jason Leita Repine, FNP  Brinzolamide-Brimonidine 1-0.2 % SUSP Place 1 drop into both eyes 2 (two) times daily. 05/29/24   [provider]  Carboxymethylcellulose Sod PF (REFRESH PLUS) 0.5 % SOLN Place 1 drop into both eyes in the morning, at noon, and at bedtime.    [provider]  cetirizine  (ZYRTEC ) 10 MG tablet Take 1 tablet (10 mg total) by mouth daily. 03/01/24   Purcell Emil Schanz, MD  diphenhydramine-acetaminophen  (TYLENOL  PM) 25-500 MG TABS tablet Take 1 tablet by mouth 2 (two) times daily as needed (pain/sleep).    [provider]  esomeprazole (NEXIUM) 20 MG capsule Take 20 mg by mouth 2 (two) times daily before a meal. 05/15/24   [provider]  famotidine  (PEPCID ) 20 MG tablet TAKE 1 TABLET TWICE DAILY Patient not taking: Reported on 08/01/2024 07/22/21   Jason Leita Repine, FNP  Glucose 15  GM/32ML GEL TAKE 1 TUBE BY MOUTH AS DIRECTED BY YOUR PROVIDER AS NEEDED LOW GLUCOSE 06/16/22   [provider]  isosorbide  mononitrate (IMDUR ) 30 MG 24 hr tablet Take 30 mg by mouth daily. 06/16/22   [provider]  ketoconazole (NIZORAL) 2 % cream Apply topically daily as needed for irritation. 12/03/15   [provider]  ketotifen  (EYE ITCH RELIEF) 0.035 % ophthalmic solution Place 1 drop into both eyes in the morning and at bedtime.    [provider]  latanoprost (XALATAN) 0.005 % ophthalmic solution Place 1 drop into both eyes at bedtime.    [provider]  lisinopril  (ZESTRIL ) 40 MG tablet Take 1 tablet (40 mg total) by mouth daily. Overdue for Annual appt must see provider for future refills Patient taking differently: Take 40 mg by mouth daily. 07/14/20   Jason Leita Repine, FNP  metFORMIN  (GLUCOPHAGE ) 500 MG tablet Take 1 tablet (500 mg total) by mouth daily with breakfast. Per patient, he takes 1/4 tablet per day when he needs it Patient taking differently: Take 500 mg by mouth 3 (three) times daily. 11/10/18   Jason Leita Repine, FNP  primidone  (MYSOLINE ) 50 MG tablet Take 1 tablet (50 mg total) by mouth 2 (two) times daily. Per patient, takes  1 1/2 tablet in the am and 1 tablet in the afternoon Patient taking differently: Take 50 mg by mouth 3 (three) times daily. 11/10/18   Jason Leita Repine, FNP  propranolol  (  INDERAL ) 10 MG tablet Patient takes 1 1/2 tablet in the am, 1 in the afternoon, 1 in the evening Patient taking differently: Take 10 mg by mouth 4 (four) times daily. 11/10/18   Jason Leita Repine, FNP  simvastatin  (ZOCOR ) 20 MG tablet Take 20 mg by mouth at bedtime.     [provider]  tamsulosin  (FLOMAX ) 0.4 MG CAPS capsule Take 1 capsule by mouth once daily 08/06/24   Elma Shands Jose, MD  terbinafine (LAMISIL) 1 % cream Apply 1 Application topically 2 (two) times daily as needed (Rash). 07/08/22    [provider]  traZODone (DESYREL) 50 MG tablet Take 50 mg by mouth at bedtime. 07/24/24   [provider]  triamcinolone  cream (KENALOG ) 0.1 % Apply 1 application topically 2 (two) times daily. Patient taking differently: Apply 1 application  topically 2 (two) times daily as needed (Rasg). 04/25/20   Rollene Almarie LABOR, MD  TRUE METRIX BLOOD GLUCOSE TEST test strip USE AS DIRECTED 07/30/24   Purcell Emil Schanz, MD  TRUEplus Lancets 33G MISC USE PER INSTRUCTIONS. 06/13/24   Purcell Emil Schanz, MD    Allergies  Allergen Reactions   Prozac [Fluoxetine Hcl] Other (See Comments)    agitation   Dye Fdc Red [Red Dye #40 (Allura Red)] Rash    Patient Active Problem List   Diagnosis Date Noted   Left lower quadrant abdominal pain 04/09/2024   History of kidney stones 04/09/2024   Lower urinary tract symptoms 04/09/2024   Cervical spondylosis with myelopathy and radiculopathy 12/28/2022   Depression, unspecified 08/10/2022   Thyroid  lesion 08/10/2022   Recurrent headache 07/22/2022   Left sided sciatica 03/15/2022   Current smoker 03/15/2022   Angina pectoris    Hypertension associated with diabetes (HCC) 01/22/2019   Diastolic dysfunction 11/03/2015   Mixed hyperlipidemia 11/01/2015   Chronic neck pain 11/01/2015   TIA (transient ischemic attack) 10/31/2015   Dyslipidemia associated with type 2 diabetes mellitus (HCC) 10/31/2015   Anxiety and depression 10/31/2015   Tobacco use 10/31/2015   Essential tremor 10/31/2015    Past Medical History:  Diagnosis Date   Agoraphobia    Anxiety    Arthritis    Coronary artery disease    1 stent   Diabetes mellitus without complication (HCC)    Essential Tremor    GERD (gastroesophageal reflux disease)    Headache    Hepatitis 2012   pt states he had Hep C but was treated   History of hiatal hernia    Hypercholesteremia    Hypertension    MDD (major depressive disorder)    Psychosis (HCC)    Stroke (HCC)     series of little mini strokes per pt    Past Surgical History:  Procedure Laterality Date   ANTERIOR CERVICAL DECOMPRESSION/DISCECTOMY FUSION 4 LEVELS N/A 12/28/2022   Procedure: CERVICAL THREE-FOUR, CERVICAL FOUR-FIVE, CERVICAL FIVE-SIX, CERVICAL SIX-SEVEN ANTERIOR CERVICAL DECOMPRESSION/DISCECTOMY FUSION;  Surgeon: Louis Shove, MD;  Location: [facility] OR;  Service: Neurosurgery;  Laterality: N/A;   CARDIAC CATHETERIZATION     CHOLECYSTECTOMY     CORONARY PRESSURE/FFR STUDY N/A 08/06/2024   Procedure: CORONARY PRESSURE/FFR STUDY;  Surgeon: Mady Bruckner, MD;  Location: [facility] INVASIVE CV LAB;  Service: Cardiovascular;  Laterality: N/A;   CORONARY STENT INTERVENTION N/A 09/24/2019   Procedure: CORONARY STENT INTERVENTION;  Surgeon: Dann Candyce RAMAN, MD;  Location: [facility] INVASIVE CV LAB;  Service: Cardiovascular;  Laterality: N/A;   LEFT HEART CATH N/A 09/24/2019   Procedure: Left Heart  Cath;  Surgeon: Dann Candyce RAMAN, MD;  [facility] INVAS[facility] CV LAB;  Service: Cardiovascular;  Laterality: N/A;   LEFT HEART CATH AND CORONARY ANGIOGRAPHY N/A 08/06/2024   Procedure: LEFT HEART CATH AND CORONARY ANGIOGRAPHY;  Surgeon: Mady Bruckner, MD;  Location: [facility] INVASIVE CV LAB;  Service: Cardiovascular;  Laterality: N/A;    Social History   Socioeconomic History   Marital status: Married    Spouse name: Jon   Number of children: 1   Years of education: 12   Highest education level: Some college, no degree  Occupational History   Occupation: Disabled  Tobacco Use   Smoking status: Every Day    Current packs/day: 0.50    Average packs/day: 0.5 packs/day for 52.9 years (26.4 ttl pk-yrs)    Types: Cigarettes    Start date: 1973    Passive exposure: Current   Smokeless tobacco: Never   Tobacco comments:    Patient states he is not ready to quit smoking as of 12/01/2023.  Vaping Use   Vaping status: Never Used  Substance and Sexual Activity   Alcohol  use: Not Currently   Drug use: Not  Currently   Sexual activity: Yes  Other Topics Concern   Not on file  Social History Narrative   Married; Retired from [employer]   Current Smoker - Not ready to [employer] Drivers of [employer]: Low Risk  (12/01/2023)   Overall Financial Resource Strain (CARDIA)    Difficulty of Paying Living Expenses: Not hard at all  Food Insecurity: No Food Insecurity (12/01/2023)   Hunger Vital Sign    Worried About Running Out of Food in the Last Year: Never true    Ran Out of Food in the Last Year: Never true  Transportation Needs: No Transportation Needs (12/01/2023)   PRAPARE - Administrator, Civil Service (Medical): No    Lack of Transportation (Non-Medical): No  Physical Activity: Insufficiently Active (12/01/2023)   Exercise Vital Sign    Days of Exercise per Week: 3 days    Minutes of Exercise per Session: 30 min  Stress: No Stress Concern Present (12/01/2023)   [employer] of Occupational Health - Occupational Stress Questionnaire    Feeling of Stress : Not at all  Social Connections: Moderately Isolated (12/01/2023)   Social Connection and Isolation Panel    Frequency of Communication with Friends and Family: More than three times a week    Frequency of Social Gatherings with Friends and Family: More than three times a week    Attends Religious Services: Never    Database Administrator or Organizations: No    Attends Banker Meetings: Never    Marital Status: Married  Catering Manager Violence: Not At Risk (12/01/2023)   Humiliation, Afraid, Rape, and Kick questionnaire    Fear of Current or Ex-Partner: No    Emotionally Abused: No    Physically Abused: No    Sexually Abused: No    Family History  Problem Relation Age of Onset   Cancer Mother      Review of Systems  Constitutional: Negative.  Negative for chills and fever.  HENT: Negative.  Negative for congestion and sore throat.   Respiratory: Negative.  Negative  for cough and shortness of breath.   Cardiovascular: Negative.  Negative for chest pain and palpitations.  Gastrointestinal:  Negative for abdominal pain, diarrhea, nausea and vomiting.  Genitourinary: Negative.  Negative for dysuria and hematuria.  Skin: Negative.  Negative for rash.  Neurological: Negative.  Negative for dizziness and headaches.  All other systems reviewed and are negative.   Vitals:   08/13/24 1030  BP: 128/64  Pulse: (!) 57  Temp: 98.3 F (36.8 C)  SpO2: 96%    Physical Exam Vitals reviewed.  Constitutional:      Appearance: Normal appearance.  HENT:     Head: Normocephalic.     Mouth/Throat:     Mouth: Mucous membranes are moist.     Pharynx: Oropharynx is clear.  Eyes:     Extraocular Movements: Extraocular movements intact.     Conjunctiva/sclera: Conjunctivae normal.     Pupils: Pupils are equal, round, and reactive to light.  Cardiovascular:     Rate and Rhythm: Normal rate and regular rhythm.     Pulses: Normal pulses.     Heart sounds: Normal heart sounds.  Pulmonary:     Effort: Pulmonary effort is normal.     Breath sounds: Normal breath sounds.  Abdominal:     Palpations: Abdomen is soft.     Tenderness: There is no abdominal tenderness.  Musculoskeletal:     Cervical back: No tenderness.  Lymphadenopathy:     Cervical: No cervical adenopathy.  Skin:    General: Skin is warm and dry.     Capillary Refill: Capillary refill takes less than 2 seconds.  Neurological:     General: No focal deficit present.     Mental Status: He is alert and oriented to person, place, and time.  Psychiatric:        Mood and Affect: Mood normal.        Behavior: Behavior normal.    Results for orders placed or performed in visit on 08/13/24 (from the past 24 hours)  POCT HgB A1C     Status: Abnormal   Collection Time: 08/13/24 11:08 AM  Result Value Ref Range   Hemoglobin A1C     HbA1c POC (<> result, manual entry) 6.7 4.0 - 5.6 %   HbA1c, POC  (prediabetic range)     HbA1c, POC (controlled diabetic range)        ASSESSMENT & PLAN: A total of 42 minutes was spent with the patient and counseling/coordination of care regarding preparing for this visit, review of most recent office visit notes, review of multiple chronic medical conditions and their management, cardiovascular risks associated with hypertension and diabetes, review of all medications, review of most recent bloodwork results including interpretation of today's hemoglobin A1c, review of health maintenance items, education on nutrition, prognosis, documentation, and need for follow up.  Problem List Items Addressed This Visit       Cardiovascular and Mediastinum   Hypertension associated with diabetes (HCC) - Primary   BP Readings from Last 3 Encounters:  08/13/24 128/64  08/06/24 126/63  08/01/24 107/64   Lab Results  Component Value Date   HGBA1C 6.7 08/13/2024   Well-controlled hypertension and diabetes Continue lisinopril  40 mg daily and amlodipine  10 mg daily Continues metformin  500 mg 3 times a day Cardiovascular risks associated with hypertension and diabetes discussed Diet and nutrition discussed Follow-up in 6 months       Relevant Orders   Microalbumin / creatinine urine ratio   POCT HgB A1C (Completed)     Endocrine   Dyslipidemia associated with type 2 diabetes mellitus (HCC)   Chronic stable conditions Continue simvastatin  20 mg daily Diet and nutrition discussed        Nervous  and Auditory   Essential tremor   Stable. Continues propranolol  10 mg 3 times a day        Other   Current smoker   Cardiovascular and cancer risks associated with smoking discussed Cessation advice given Not ready to quit      Lower urinary tract symptoms   Well-controlled symptoms Continues Flomax  0.4 mg at bedtime      Patient Instructions  Health Maintenance After Age 58 After age 49, you are at a higher risk for certain long-term diseases  and infections as well as injuries from falls. Falls are a major cause of broken bones and head injuries in people who are older than age 4. Getting regular preventive care can help to keep you healthy and well. Preventive care includes getting regular testing and making lifestyle changes as recommended by your health care provider. Talk with your health care provider about: Which screenings and tests you should have. A screening is a test that checks for a disease when you have no symptoms. A diet and exercise plan that is right for you. What should I know about screenings and tests to prevent falls? Screening and testing are the best ways to find a health problem early. Early diagnosis and treatment give you the best chance of managing medical conditions that are common after age 50. Certain conditions and lifestyle choices may make you more likely to have a fall. Your health care provider may recommend: Regular vision checks. Poor vision and conditions such as cataracts can make you more likely to have a fall. If you wear glasses, make sure to get your prescription updated if your vision changes. Medicine review. Work with your health care provider to regularly review all of the medicines you are taking, including over-the-counter medicines. Ask your health care provider about any side effects that may make you more likely to have a fall. Tell your health care provider if any medicines that you take make you feel dizzy or sleepy. Strength and balance checks. Your health care provider may recommend certain tests to check your strength and balance while standing, walking, or changing positions. Foot health exam. Foot pain and numbness, as well as not wearing proper footwear, can make you more likely to have a fall. Screenings, including: Osteoporosis screening. Osteoporosis is a condition that causes the bones to get weaker and break more easily. Blood pressure screening. Blood pressure changes and  medicines to control blood pressure can make you feel dizzy. Depression screening. You may be more likely to have a fall if you have a fear of falling, feel depressed, or feel unable to do activities that you used to do. Alcohol  use screening. Using too much alcohol  can affect your balance and may make you more likely to have a fall. Follow these instructions at home: Lifestyle Do not drink alcohol  if: Your health care provider tells you not to drink. If you drink alcohol : Limit how much you have to: 0-1 drink a day for women. 0-2 drinks a day for men. Know how much alcohol  is in your drink. In the [place] one drink equals one 12 oz bottle of beer (355 mL), one 5 oz glass of wine (148 mL), or one 1 oz glass of hard liquor (44 mL). Do not use any products that contain nicotine  or tobacco. These products include cigarettes, chewing tobacco, and vaping devices, such as e-cigarettes. If you need help quitting, ask your health care provider. Activity  Follow a regular exercise program to stay  fit. This will help you maintain your balance. Ask your health care provider what types of exercise are appropriate for you. If you need a cane or walker, use it as recommended by your health care provider. Wear supportive shoes that have nonskid soles. Safety  Remove any tripping hazards, such as rugs, cords, and clutter. Install safety equipment such as grab bars in bathrooms and safety rails on stairs. Keep rooms and walkways well-lit. General instructions Talk with your health care provider about your risks for falling. Tell your health care provider if: You fall. Be sure to tell your health care provider about all falls, even ones that seem minor. You feel dizzy, tiredness (fatigue), or off-balance. Take over-the-counter and prescription medicines only as told by your health care provider. These include supplements. Eat a healthy diet and maintain a healthy weight. A healthy diet includes low-fat  dairy products, low-fat (lean) meats, and fiber from whole grains, beans, and lots of fruits and vegetables. Stay current with your vaccines. Schedule regular health, dental, and eye exams. Summary Having a healthy lifestyle and getting preventive care can help to protect your health and wellness after age 43. Screening and testing are the best way to find a health problem early and help you avoid having a fall. Early diagnosis and treatment give you the best chance for managing medical conditions that are more common for people who are older than age 93. Falls are a major cause of broken bones and head injuries in people who are older than age 16. Take precautions to prevent a fall at home. Work with your health care provider to learn what changes you can make to improve your health and wellness and to prevent falls. This information is not intended to replace advice given to you by your health care provider. Make sure you discuss any questions you have with your health care provider. Document Revised: 01/26/2021 Document Reviewed: 01/26/2021 Elsevier Patient Education  2024 Elsevier Inc[employer]        Emil Schaumann, MD [facility]

## 2024-08-14 NOTE — Progress Notes (Signed)
 Cardiology Office Note:  .   Date:  08/28/2024  ID:  Troy Little, DOB 1951/01/01, MRN [id number] PCP: Purcell Emil Schanz, MD  [facility] Providers Cardiologist:  Georganna Archer, MD    History of Present Illness: .   Troy Little is a 73 y.o. male  with a PMH of CAD s/p PCI to RCA (2021), HTN, HLD, DM 2, hepatitis C, prior polysubstance abuse (EtOH, cocaine), and tobacco.  Patient was referred from the [place] with recurrent chest pain. Repeat cath 08/06/24 mod non-obstructive CAD with widely patent RCA stent. Medical therapy recommended. Patient denies chest pain, dyspnea. Smoking 1/2 ppd, has cut back but not willing to quit. No longer drinks alcohol  or drugs. Reviewed cath results with patient.  ROS:    Studies Reviewed: SABRA         Prior CV Studies:   Cath 07/2024 Moderate, non-obstructive coronary artery disease, as detailed below, including 50% ostial LAD (RFR = 0.98) and LCx (RFR = 1.0) stenoses that are not hemodynamically significant. Widely patent proximal/mid RCA stent. Normal left ventricular systolic function (LVEF 55-65%) with normal filling pressure (LVEDP 10 mmHg).   Recommendations: Continue medical therapy and secondary prevention of coronary artery disease.   Lonni Hanson, MD [facility]      CARDIAC CATHETERIZATION 09/24/2019   Conclusion  The left ventricular systolic function is normal.  LV end diastolic pressure is low; left heart cath done due to low BP post sedation. Additional IV hydration was given for low BP  The left ventricular ejection fraction is 55-65% by visual estimate. LVEF checked since there was no recent LVEF on the chart and additional fluids were needed.  There is no aortic valve stenosis.  Prox RCA lesion is 90% stenosed.  A drug-eluting stent was successfully placed using a SYNERGY XD 2.50X16, postdilated to 2.75 mm.  Post intervention, there is a 0% residual stenosis.  Hypotension resolved during the  procedure.   Continue aspirin  and Plavix .  He needs to stop smoking.   Findings Coronary Findings Diagnostic  Dominance: Right   Right Coronary Artery Prox RCA lesion is 90% stenosed.   Intervention   Prox RCA lesion Stent CATH VISTA GUIDE 6FR JR4 guide catheter was inserted. Lesion crossed with guidewire using a WIRE ASAHI PROWATER 180CM. Pre-stent angioplasty was performed using a BALLOON EMERGE MR 2.0X12. A drug-eluting stent was successfully placed using a SYNERGY XD 2.50X16. Stent strut is well apposed. Post-stent angioplasty was performed using a BALLOON Thayer EUPHORA RX 2.75X8. Post-Intervention Lesion Assessment The intervention was successful. Pre-interventional TIMI flow is 3. Post-intervention TIMI flow is 3. No complications occurred at this lesion. There is a 0% residual stenosis post intervention.       ECHOCARDIOGRAM   ECHOCARDIOGRAM COMPLETE 11/01/2015   Narrative *[facility]* *[facility]* [address] [place], [place] [address] [phone]   ------------------------------------------------------------------- Transthoracic Echocardiography   Patient:    Troy, Little MR #:       [id number] Study Date: 11/01/2015 Gender:     M Age:        73 Height:     172.7 cm Weight:     75.8 kg BSA:        1.92 m^2 Pt. Status: Room:       5M15C   SONOGRAPHER  Alberta Lis, RVS TISA Rummer, Scott [id number] ATTENDING    Efrain Lamar ORN SONOGRAPHER  Tinnie Barefoot, RDCS, CCT PERFORMING   [facility], Inpatient  cc:   ------------------------------------------------------------------- LV EF: 55% -   60%   ------------------------------------------------------------------- Indications:      TIA 435.9.   ------------------------------------------------------------------- History:   Risk factors:  Diabetes mellitus. Dyslipidemia.   ------------------------------------------------------------------- Study Conclusions   - Left  ventricle: The cavity size was normal. Wall thickness was normal. Systolic function was normal. The estimated ejection fraction was in the range of 55% to 60%. Wall motion was normal; there were no regional wall motion abnormalities. Doppler parameters are consistent with abnormal left ventricular relaxation (grade 1 diastolic dysfunction).   Impressions:   - Normal LV systolic function; grade 1 diastolic dysfunction; trace TR.   Transthoracic echocardiography.  M-mode, complete 2D, spectral Doppler, and color Doppler.  Birthdate:  Patient birthdate: 1950/10/30.  Age:  Patient is 73 yr old.  Sex:  Gender: male. BMI: 25.4 kg/m^2.  Blood pressure:     138/76  Patient status: Inpatient.  Study date:  Study date: 11/01/2015. Study time: 07:55 AM.  Location:  Bedside.   -------------------------------------------------------------------   ------------------------------------------------------------------- Left ventricle:  The cavity size was normal. Wall thickness was normal. Systolic function was normal. The estimated ejection fraction was in the range of 55% to 60%. Wall motion was normal; there were no regional wall motion abnormalities. Doppler parameters are consistent with abnormal left ventricular relaxation (grade 1 diastolic dysfunction).   ------------------------------------------------------------------- Aortic valve:   Trileaflet; normal thickness leaflets. Mobility was not restricted.  Doppler:  Transvalvular velocity was within the normal range. There was no stenosis. There was no regurgitation.   ------------------------------------------------------------------- Aorta:  Aortic root: The aortic root was normal in size.   ------------------------------------------------------------------- Mitral valve:   Structurally normal valve.   Mobility was not restricted.  Doppler:  Transvalvular velocity was within the normal range. There was no evidence for stenosis. There  was no regurgitation.    Peak gradient (D): 3 mm Hg.   ------------------------------------------------------------------- Left atrium:  The atrium was normal in size.   ------------------------------------------------------------------- Right ventricle:  The cavity size was normal. Systolic function was normal.   ------------------------------------------------------------------- Pulmonic valve:    Doppler:  Transvalvular velocity was within the normal range. There was no evidence for stenosis.   ------------------------------------------------------------------- Tricuspid valve:   Structurally normal valve.    Doppler: Transvalvular velocity was within the normal range. There was trivial regurgitation.   ------------------------------------------------------------------- Right atrium:  The atrium was normal in size.   ------------------------------------------------------------------- Pericardium:  There was no pericardial effusion.   ------------------------------------------------------------------- Systemic veins: Inferior vena cava: The vessel was normal in size.   ------------------------------------------------------------------- Measurements   Left ventricle                         Value        Reference LV ID, ED, PLAX chordal        (L)     41.6  mm     43 - 52 LV ID, ES, PLAX chordal                24.6  mm     23 - 38 LV fx shortening, PLAX chordal         41    %      >=29 LV PW thickness, ED                    7.88  mm     --------- IVS/LV PW ratio, ED  1.02         <=1.3 LV e&', lateral                         13.6  cm/s   --------- LV E/e&', lateral                       6.63         --------- LV e&', medial                          9.79  cm/s   --------- LV E/e&', medial                        9.21         --------- LV e&', average                         11.7  cm/s   --------- LV E/e&', average                       7.71          ---------   Ventricular septum                     Value        Reference IVS thickness, ED                      8.01  mm     ---------   LVOT                                   Value        Reference LVOT ID, S                             21    mm     --------- LVOT area                              3.46  cm^2   ---------   Aorta                                  Value        Reference Aortic root ID, ED                     30    mm     ---------   Left atrium                            Value        Reference LA ID, A-P, ES                         34    mm     --------- LA ID/bsa, A-P                         1.77  cm/m^2 <=2.2 LA volume, S  41.4  ml     --------- LA volume/bsa, S                       21.6  ml/m^2 --------- LA volume, ES, 1-p A4C                 34.3  ml     --------- LA volume/bsa, ES, 1-p A4C             17.9  ml/m^2 --------- LA volume, ES, 1-p A2C                 47.4  ml     --------- LA volume/bsa, ES, 1-p A2C             24.7  ml/m^2 ---------   Mitral valve                           Value        Reference Mitral E-wave peak velocity            90.2  cm/s   --------- Mitral A-wave peak velocity            106   cm/s   --------- Mitral deceleration time       (H)     243   ms     150 - 230 Mitral peak gradient, D                3     mm Hg  --------- Mitral E/A ratio, peak                 0.9          ---------   Systemic veins                         Value        Reference Estimated CVP                          3     mm Hg  ---------   Right ventricle                        Value        Reference TAPSE                                  24.3  mm     --------- RV s&', lateral, S                      15.8  cm/s   ---------   Legend: (L)  and  (H)  mark values outside specified reference range.   ------------------------------------------------------------------- Prepared and Electronically Authenticated by   Redell Shallow 2017-02-11T14:25:40        Risk Assessment/Calculations:             Physical Exam:   VS:  BP 124/70 (BP Location: Right Arm, Patient Position: Sitting, Cuff Size: Normal)   Pulse (!) 54   Ht 5' 8 (1.727 m)   Wt 152 lb (68.9 kg)   BMI 23.11 kg/m    Orhtostatics: No data found. Wt Readings from Last 3 Encounters:  08/28/24 152 lb (68.9 kg)  08/13/24 150 lb (  68 kg)  08/06/24 151 lb (68.5 kg)    GEN: Thin, in no acute distress NECK: No JVD; No carotid bruits CARDIAC:  RRR, no murmurs, rubs, gallops RESPIRATORY:  Clear to auscultation without rales, wheezing or rhonchi  ABDOMEN: Soft, non-tender, non-distended EXTREMITIES:  No edema; No deformity cath site stable without hematoma or hemorrhage. Good brachial pulse.  ASSESSMENT AND PLAN: .   Coronary artery disease of native artery of native heart with stable angina pectoris DES RCA 2021, repeat cath 07/2024 moderate non-obstructive CAD Continue baby aspirin  81 MG daily Continue Imdur , amlodipine , lisinopril , inderal , zocor  Mixed hyperlipidemia Continue simvastatin  20 mg nightly for now LDL 56 07/2024  Hypertension associated with diabetes (HCC) - His blood pressure is at goal. Continue lisinopril  40 mg daily Continue amlodipine  5 mg daily Continue Imdur  50 mg daily   Tobacco abuse-smoking cessation discussed.        Dispo: f/u Dr. Floretta 6 months.   Signed, Olivia Pavy, PA-C

## 2024-08-28 ENCOUNTER — Ambulatory Visit: Attending: Physician Assistant | Admitting: Physician Assistant

## 2024-08-28 ENCOUNTER — Encounter: Payer: Self-pay | Admitting: Physician Assistant

## 2024-08-28 VITALS — BP 124/70 | HR 54 | Ht 68.0 in | Wt 152.0 lb

## 2024-08-28 DIAGNOSIS — I152 Hypertension secondary to endocrine disorders: Secondary | ICD-10-CM

## 2024-08-28 DIAGNOSIS — E782 Mixed hyperlipidemia: Secondary | ICD-10-CM

## 2024-08-28 DIAGNOSIS — I25118 Atherosclerotic heart disease of native coronary artery with other forms of angina pectoris: Secondary | ICD-10-CM

## 2024-08-28 DIAGNOSIS — Z720 Tobacco use: Secondary | ICD-10-CM

## 2024-08-28 DIAGNOSIS — E1159 Type 2 diabetes mellitus with other circulatory complications: Secondary | ICD-10-CM

## 2024-08-28 NOTE — Patient Instructions (Signed)
 Medication Instructions:  NO CHANGES  Lab Work: NONE TO BE DONE TODAY.  Testing/Procedures: NONE  Follow-Up: At [facility], you and your health needs are our priority.  As part of our continuing mission to provide you with exceptional heart care, our providers are all part of one team.  This team includes your primary Cardiologist (physician) and Advanced Practice Providers or APPs (Physician Assistants and Nurse Practitioners) who all work together to provide you with the care you need, when you need it.  Your next appointment:   6 MONTHS  Provider:   Georganna Archer, MD    Other Instructions:   PLEASE TRY TO QUIT SMOKING.

## 2024-09-07 ENCOUNTER — Ambulatory Visit (HOSPITAL_COMMUNITY)
Admission: RE | Admit: 2024-09-07 | Discharge: 2024-09-07 | Disposition: A | Discharge status: Home / Self Care | Source: Ambulatory Visit | Attending: Student in an Organized Health Care Education/Training Program | Admitting: Student in an Organized Health Care Education/Training Program

## 2024-09-07 DIAGNOSIS — I209 Angina pectoris, unspecified: Secondary | ICD-10-CM | POA: Diagnosis present

## 2024-09-07 DIAGNOSIS — I25118 Atherosclerotic heart disease of native coronary artery with other forms of angina pectoris: Secondary | ICD-10-CM | POA: Diagnosis not present

## 2024-09-07 LAB — ECHOCARDIOGRAM COMPLETE
AR max vel: 2.18 cm2
AV Area VTI: 2.16 cm2
AV Area mean vel: 2.16 cm2
AV Mean grad: 2 mmHg
AV Peak grad: 3.5 mmHg
Ao pk vel: 0.94 m/s
Area-P 1/2: 3.6 cm2
S' Lateral: 2.9 cm

## 2025-02-04 ENCOUNTER — Ambulatory Visit
